# Patient Record
Sex: Female | Born: 1940 | Race: White | Hispanic: No | Marital: Single | Smoking: Current some day smoker
Health system: Southern US, Community
[De-identification: ages and names within clinical notes are randomized; demographics above are authoritative.]

## PROBLEM LIST (undated history)

## (undated) DIAGNOSIS — R0683 Snoring: Secondary | ICD-10-CM

## (undated) DIAGNOSIS — K219 Gastro-esophageal reflux disease without esophagitis: Secondary | ICD-10-CM

## (undated) DIAGNOSIS — M8580 Other specified disorders of bone density and structure, unspecified site: Secondary | ICD-10-CM

## (undated) DIAGNOSIS — H6980 Other specified disorders of Eustachian tube, unspecified ear: Secondary | ICD-10-CM

## (undated) DIAGNOSIS — R945 Abnormal results of liver function studies: Secondary | ICD-10-CM

## (undated) DIAGNOSIS — R3129 Other microscopic hematuria: Secondary | ICD-10-CM

## (undated) DIAGNOSIS — I499 Cardiac arrhythmia, unspecified: Secondary | ICD-10-CM

## (undated) DIAGNOSIS — F329 Major depressive disorder, single episode, unspecified: Secondary | ICD-10-CM

## (undated) DIAGNOSIS — IMO0002 Reserved for concepts with insufficient information to code with codable children: Secondary | ICD-10-CM

## (undated) DIAGNOSIS — C50919 Malignant neoplasm of unspecified site of unspecified female breast: Secondary | ICD-10-CM

## (undated) DIAGNOSIS — R0789 Other chest pain: Secondary | ICD-10-CM

## (undated) DIAGNOSIS — K8020 Calculus of gallbladder without cholecystitis without obstruction: Secondary | ICD-10-CM

## (undated) DIAGNOSIS — M1990 Unspecified osteoarthritis, unspecified site: Secondary | ICD-10-CM

## (undated) DIAGNOSIS — F32A Depression, unspecified: Secondary | ICD-10-CM

## (undated) DIAGNOSIS — Z720 Tobacco use: Secondary | ICD-10-CM

## (undated) DIAGNOSIS — F419 Anxiety disorder, unspecified: Secondary | ICD-10-CM

## (undated) DIAGNOSIS — Z860100 Personal history of colon polyps, unspecified: Secondary | ICD-10-CM

## (undated) DIAGNOSIS — E785 Hyperlipidemia, unspecified: Secondary | ICD-10-CM

## (undated) DIAGNOSIS — R300 Dysuria: Secondary | ICD-10-CM

## (undated) DIAGNOSIS — R0902 Hypoxemia: Secondary | ICD-10-CM

## (undated) DIAGNOSIS — R32 Unspecified urinary incontinence: Secondary | ICD-10-CM

## (undated) DIAGNOSIS — H6990 Unspecified Eustachian tube disorder, unspecified ear: Secondary | ICD-10-CM

## (undated) DIAGNOSIS — I10 Essential (primary) hypertension: Secondary | ICD-10-CM

## (undated) DIAGNOSIS — E039 Hypothyroidism, unspecified: Secondary | ICD-10-CM

## (undated) DIAGNOSIS — J40 Bronchitis, not specified as acute or chronic: Secondary | ICD-10-CM

## (undated) HISTORY — DX: Hypoxemia: R09.02

## (undated) HISTORY — PX: TONSILLECTOMY: SUR1361

## (undated) HISTORY — PX: OTHER SURGICAL HISTORY: SHX169

## (undated) HISTORY — DX: Tobacco use: Z72.0

## (undated) HISTORY — DX: Unspecified urinary incontinence: R32

## (undated) HISTORY — DX: Other specified disorders of Eustachian tube, unspecified ear: H69.80

## (undated) HISTORY — DX: Anxiety disorder, unspecified: F41.9

## (undated) HISTORY — DX: Hyperlipidemia, unspecified: E78.5

## (undated) HISTORY — DX: Abnormal results of liver function studies: R94.5

## (undated) HISTORY — DX: Other chest pain: R07.89

## (undated) HISTORY — DX: Calculus of gallbladder without cholecystitis without obstruction: K80.20

## (undated) HISTORY — PX: VESICOVAGINAL FISTULA CLOSURE W/ TAH: SUR271

## (undated) HISTORY — DX: Other specified disorders of bone density and structure, unspecified site: M85.80

## (undated) HISTORY — DX: Hypothyroidism, unspecified: E03.9

## (undated) HISTORY — DX: Bronchitis, not specified as acute or chronic: J40

## (undated) HISTORY — DX: Unspecified eustachian tube disorder, unspecified ear: H69.90

## (undated) HISTORY — PX: TONSILECTOMY, ADENOIDECTOMY, BILATERAL MYRINGOTOMY AND TUBES: SHX2538

## (undated) HISTORY — DX: Personal history of colon polyps, unspecified: Z86.0100

## (undated) HISTORY — DX: Reserved for concepts with insufficient information to code with codable children: IMO0002

## (undated) HISTORY — DX: Dysuria: R30.0

## (undated) HISTORY — DX: Personal history of colonic polyps: Z86.010

## (undated) HISTORY — DX: Unspecified osteoarthritis, unspecified site: M19.90

## (undated) HISTORY — DX: Depression, unspecified: F32.A

## (undated) HISTORY — DX: Cardiac arrhythmia, unspecified: I49.9

## (undated) HISTORY — DX: Essential (primary) hypertension: I10

## (undated) HISTORY — DX: Gastro-esophageal reflux disease without esophagitis: K21.9

## (undated) HISTORY — DX: Snoring: R06.83

## (undated) HISTORY — DX: Major depressive disorder, single episode, unspecified: F32.9

## (undated) HISTORY — DX: Other microscopic hematuria: R31.29

## (undated) HISTORY — DX: Malignant neoplasm of unspecified site of unspecified female breast: C50.919

---

## 2000-12-18 ENCOUNTER — Encounter: Admission: RE | Admit: 2000-12-18 | Discharge: 2000-12-18 | Payer: Self-pay | Admitting: Oncology

## 2000-12-31 ENCOUNTER — Encounter (INDEPENDENT_AMBULATORY_CARE_PROVIDER_SITE_OTHER): Payer: Self-pay | Admitting: Specialist

## 2000-12-31 ENCOUNTER — Ambulatory Visit (HOSPITAL_BASED_OUTPATIENT_CLINIC_OR_DEPARTMENT_OTHER): Admission: RE | Admit: 2000-12-31 | Discharge: 2000-12-31 | Payer: Self-pay | Admitting: Otolaryngology

## 2001-04-08 ENCOUNTER — Other Ambulatory Visit: Admission: RE | Admit: 2001-04-08 | Discharge: 2001-04-08 | Payer: Self-pay | Admitting: Internal Medicine

## 2001-04-09 ENCOUNTER — Encounter: Payer: Self-pay | Admitting: Internal Medicine

## 2001-04-09 ENCOUNTER — Ambulatory Visit (HOSPITAL_COMMUNITY): Admission: RE | Admit: 2001-04-09 | Discharge: 2001-04-09 | Payer: Self-pay | Admitting: Internal Medicine

## 2001-06-11 ENCOUNTER — Ambulatory Visit (HOSPITAL_COMMUNITY): Admission: RE | Admit: 2001-06-11 | Discharge: 2001-06-11 | Payer: Self-pay | Admitting: Internal Medicine

## 2001-06-11 ENCOUNTER — Encounter: Payer: Self-pay | Admitting: Internal Medicine

## 2001-06-19 ENCOUNTER — Encounter: Admission: RE | Admit: 2001-06-19 | Discharge: 2001-06-19 | Payer: Self-pay | Admitting: Oncology

## 2001-12-17 ENCOUNTER — Encounter (HOSPITAL_COMMUNITY): Admission: RE | Admit: 2001-12-17 | Discharge: 2002-01-16 | Payer: Self-pay | Admitting: Oncology

## 2001-12-17 ENCOUNTER — Encounter: Admission: RE | Admit: 2001-12-17 | Discharge: 2001-12-17 | Payer: Self-pay | Admitting: Oncology

## 2002-04-10 ENCOUNTER — Encounter: Payer: Self-pay | Admitting: Internal Medicine

## 2002-04-10 ENCOUNTER — Ambulatory Visit (HOSPITAL_COMMUNITY): Admission: RE | Admit: 2002-04-10 | Discharge: 2002-04-10 | Payer: Self-pay | Admitting: Internal Medicine

## 2002-04-22 ENCOUNTER — Ambulatory Visit (HOSPITAL_COMMUNITY): Admission: RE | Admit: 2002-04-22 | Discharge: 2002-04-22 | Payer: Self-pay | Admitting: Internal Medicine

## 2002-04-22 HISTORY — PX: COLONOSCOPY: SHX174

## 2002-07-01 ENCOUNTER — Encounter (HOSPITAL_COMMUNITY): Admission: RE | Admit: 2002-07-01 | Discharge: 2002-07-31 | Payer: Self-pay | Admitting: Oncology

## 2002-07-01 ENCOUNTER — Encounter: Admission: RE | Admit: 2002-07-01 | Discharge: 2002-07-01 | Payer: Self-pay | Admitting: Oncology

## 2003-01-02 ENCOUNTER — Encounter: Admission: RE | Admit: 2003-01-02 | Discharge: 2003-01-02 | Payer: Self-pay | Admitting: Oncology

## 2003-01-02 ENCOUNTER — Encounter (HOSPITAL_COMMUNITY): Admission: RE | Admit: 2003-01-02 | Discharge: 2003-01-22 | Payer: Self-pay | Admitting: Oncology

## 2003-04-20 ENCOUNTER — Ambulatory Visit (HOSPITAL_COMMUNITY): Admission: RE | Admit: 2003-04-20 | Discharge: 2003-04-20 | Payer: Self-pay | Admitting: Internal Medicine

## 2003-08-27 ENCOUNTER — Encounter (HOSPITAL_COMMUNITY): Admission: RE | Admit: 2003-08-27 | Discharge: 2003-09-26 | Payer: Self-pay | Admitting: Orthopedic Surgery

## 2003-09-29 ENCOUNTER — Encounter (HOSPITAL_COMMUNITY): Admission: RE | Admit: 2003-09-29 | Discharge: 2003-10-29 | Payer: Self-pay | Admitting: Orthopedic Surgery

## 2004-01-18 ENCOUNTER — Encounter: Admission: RE | Admit: 2004-01-18 | Discharge: 2004-01-22 | Payer: Self-pay | Admitting: Oncology

## 2004-02-01 ENCOUNTER — Encounter: Admission: RE | Admit: 2004-02-01 | Discharge: 2004-02-01 | Payer: Self-pay | Admitting: Oncology

## 2004-02-01 ENCOUNTER — Encounter (HOSPITAL_COMMUNITY): Admission: RE | Admit: 2004-02-01 | Discharge: 2004-03-02 | Payer: Self-pay | Admitting: Oncology

## 2004-04-20 ENCOUNTER — Ambulatory Visit (HOSPITAL_COMMUNITY): Admission: RE | Admit: 2004-04-20 | Discharge: 2004-04-20 | Payer: Self-pay | Admitting: Internal Medicine

## 2005-01-16 ENCOUNTER — Ambulatory Visit (HOSPITAL_COMMUNITY): Payer: Self-pay | Admitting: Oncology

## 2005-01-16 ENCOUNTER — Encounter (HOSPITAL_COMMUNITY): Admission: RE | Admit: 2005-01-16 | Discharge: 2005-01-20 | Payer: Self-pay | Admitting: Oncology

## 2005-01-16 ENCOUNTER — Encounter: Admission: RE | Admit: 2005-01-16 | Discharge: 2005-01-20 | Payer: Self-pay | Admitting: Oncology

## 2005-04-25 ENCOUNTER — Ambulatory Visit (HOSPITAL_COMMUNITY): Admission: RE | Admit: 2005-04-25 | Discharge: 2005-04-25 | Payer: Self-pay | Admitting: Family Medicine

## 2005-05-04 ENCOUNTER — Encounter: Admission: RE | Admit: 2005-05-04 | Discharge: 2005-05-04 | Payer: Self-pay | Admitting: Family Medicine

## 2005-05-04 ENCOUNTER — Encounter (INDEPENDENT_AMBULATORY_CARE_PROVIDER_SITE_OTHER): Payer: Self-pay | Admitting: Specialist

## 2005-05-04 ENCOUNTER — Encounter (INDEPENDENT_AMBULATORY_CARE_PROVIDER_SITE_OTHER): Payer: Self-pay | Admitting: Radiology

## 2005-05-05 ENCOUNTER — Encounter (HOSPITAL_COMMUNITY): Admission: RE | Admit: 2005-05-05 | Discharge: 2005-06-04 | Payer: Self-pay | Admitting: Oncology

## 2005-05-05 ENCOUNTER — Ambulatory Visit (HOSPITAL_COMMUNITY): Payer: Self-pay | Admitting: Oncology

## 2005-05-05 ENCOUNTER — Encounter: Admission: RE | Admit: 2005-05-05 | Discharge: 2005-05-05 | Payer: Self-pay | Admitting: Oncology

## 2005-05-11 ENCOUNTER — Encounter: Admission: RE | Admit: 2005-05-11 | Discharge: 2005-05-11 | Payer: Self-pay | Admitting: General Surgery

## 2005-05-15 ENCOUNTER — Encounter (INDEPENDENT_AMBULATORY_CARE_PROVIDER_SITE_OTHER): Payer: Self-pay | Admitting: *Deleted

## 2005-05-15 ENCOUNTER — Encounter: Admission: RE | Admit: 2005-05-15 | Discharge: 2005-05-15 | Payer: Self-pay | Admitting: General Surgery

## 2005-05-19 ENCOUNTER — Inpatient Hospital Stay (HOSPITAL_COMMUNITY): Admission: RE | Admit: 2005-05-19 | Discharge: 2005-05-21 | Payer: Self-pay | Admitting: General Surgery

## 2005-05-19 ENCOUNTER — Encounter (INDEPENDENT_AMBULATORY_CARE_PROVIDER_SITE_OTHER): Payer: Self-pay | Admitting: General Surgery

## 2005-07-23 HISTORY — PX: COLONOSCOPY: SHX174

## 2005-08-02 ENCOUNTER — Ambulatory Visit: Payer: Self-pay | Admitting: Internal Medicine

## 2005-08-02 ENCOUNTER — Ambulatory Visit (HOSPITAL_COMMUNITY): Admission: RE | Admit: 2005-08-02 | Discharge: 2005-08-02 | Payer: Self-pay | Admitting: Internal Medicine

## 2005-09-01 ENCOUNTER — Encounter (HOSPITAL_COMMUNITY): Admission: RE | Admit: 2005-09-01 | Discharge: 2005-10-01 | Payer: Self-pay | Admitting: Oncology

## 2005-09-01 ENCOUNTER — Ambulatory Visit (HOSPITAL_COMMUNITY): Payer: Self-pay | Admitting: Oncology

## 2005-09-01 ENCOUNTER — Encounter: Admission: RE | Admit: 2005-09-01 | Discharge: 2005-09-01 | Payer: Self-pay | Admitting: Oncology

## 2005-09-11 ENCOUNTER — Ambulatory Visit: Payer: Self-pay | Admitting: Oncology

## 2005-11-01 ENCOUNTER — Ambulatory Visit: Payer: Self-pay | Admitting: Oncology

## 2005-12-23 ENCOUNTER — Encounter (INDEPENDENT_AMBULATORY_CARE_PROVIDER_SITE_OTHER): Payer: Self-pay | Admitting: Family Medicine

## 2005-12-23 LAB — CONVERTED CEMR LAB: Pap Smear: NORMAL

## 2006-01-01 ENCOUNTER — Other Ambulatory Visit: Admission: RE | Admit: 2006-01-01 | Discharge: 2006-01-01 | Payer: Self-pay | Admitting: Gynecology

## 2006-01-12 ENCOUNTER — Encounter (INDEPENDENT_AMBULATORY_CARE_PROVIDER_SITE_OTHER): Payer: Self-pay | Admitting: *Deleted

## 2006-01-12 ENCOUNTER — Ambulatory Visit: Payer: Self-pay | Admitting: Family Medicine

## 2006-01-15 ENCOUNTER — Encounter (INDEPENDENT_AMBULATORY_CARE_PROVIDER_SITE_OTHER): Payer: Self-pay | Admitting: Family Medicine

## 2006-01-15 LAB — CONVERTED CEMR LAB
Cholesterol: 137 mg/dL
LDL Cholesterol: 74 mg/dL
TSH: 1.12 microintl units/mL
TSH: 1.12 microintl units/mL

## 2006-01-29 ENCOUNTER — Encounter: Admission: RE | Admit: 2006-01-29 | Discharge: 2006-01-29 | Payer: Self-pay | Admitting: Oncology

## 2006-01-29 ENCOUNTER — Ambulatory Visit (HOSPITAL_COMMUNITY): Payer: Self-pay | Admitting: Oncology

## 2006-01-29 ENCOUNTER — Encounter (HOSPITAL_COMMUNITY): Admission: RE | Admit: 2006-01-29 | Discharge: 2006-02-28 | Payer: Self-pay | Admitting: Oncology

## 2006-02-12 ENCOUNTER — Ambulatory Visit: Payer: Self-pay | Admitting: Family Medicine

## 2006-02-26 ENCOUNTER — Ambulatory Visit: Payer: Self-pay | Admitting: Family Medicine

## 2006-03-21 ENCOUNTER — Encounter: Payer: Self-pay | Admitting: Family Medicine

## 2006-03-21 DIAGNOSIS — J309 Allergic rhinitis, unspecified: Secondary | ICD-10-CM | POA: Insufficient documentation

## 2006-03-21 DIAGNOSIS — K219 Gastro-esophageal reflux disease without esophagitis: Secondary | ICD-10-CM | POA: Insufficient documentation

## 2006-03-21 DIAGNOSIS — H269 Unspecified cataract: Secondary | ICD-10-CM | POA: Insufficient documentation

## 2006-03-21 DIAGNOSIS — M899 Disorder of bone, unspecified: Secondary | ICD-10-CM | POA: Insufficient documentation

## 2006-03-21 DIAGNOSIS — E039 Hypothyroidism, unspecified: Secondary | ICD-10-CM | POA: Insufficient documentation

## 2006-03-21 DIAGNOSIS — M129 Arthropathy, unspecified: Secondary | ICD-10-CM | POA: Insufficient documentation

## 2006-03-21 DIAGNOSIS — Z860100 Personal history of colon polyps, unspecified: Secondary | ICD-10-CM | POA: Insufficient documentation

## 2006-03-21 DIAGNOSIS — E785 Hyperlipidemia, unspecified: Secondary | ICD-10-CM | POA: Insufficient documentation

## 2006-03-21 DIAGNOSIS — M949 Disorder of cartilage, unspecified: Secondary | ICD-10-CM

## 2006-03-21 DIAGNOSIS — Z853 Personal history of malignant neoplasm of breast: Secondary | ICD-10-CM | POA: Insufficient documentation

## 2006-03-21 DIAGNOSIS — I10 Essential (primary) hypertension: Secondary | ICD-10-CM | POA: Insufficient documentation

## 2006-03-26 ENCOUNTER — Ambulatory Visit: Payer: Self-pay | Admitting: Family Medicine

## 2006-05-03 ENCOUNTER — Ambulatory Visit: Payer: Self-pay | Admitting: Family Medicine

## 2006-05-08 ENCOUNTER — Encounter (INDEPENDENT_AMBULATORY_CARE_PROVIDER_SITE_OTHER): Payer: Self-pay | Admitting: Family Medicine

## 2006-05-24 ENCOUNTER — Ambulatory Visit: Payer: Self-pay | Admitting: Family Medicine

## 2006-07-18 ENCOUNTER — Encounter (INDEPENDENT_AMBULATORY_CARE_PROVIDER_SITE_OTHER): Payer: Self-pay | Admitting: Family Medicine

## 2006-07-23 LAB — CONVERTED CEMR LAB
Alkaline Phosphatase: 107 units/L (ref 39–117)
CO2: 23 meq/L (ref 19–32)
Cholesterol: 182 mg/dL (ref 0–200)
Creatinine, Ser: 0.56 mg/dL (ref 0.40–1.20)
Glucose, Bld: 105 mg/dL — ABNORMAL HIGH (ref 70–99)
LDL Cholesterol: 106 mg/dL — ABNORMAL HIGH (ref 0–99)
Potassium: 3.8 meq/L (ref 3.5–5.3)
Sodium: 140 meq/L (ref 135–145)
Total Protein: 6.8 g/dL (ref 6.0–8.3)

## 2006-07-30 ENCOUNTER — Encounter (INDEPENDENT_AMBULATORY_CARE_PROVIDER_SITE_OTHER): Payer: Self-pay | Admitting: Family Medicine

## 2006-07-30 ENCOUNTER — Ambulatory Visit (HOSPITAL_COMMUNITY): Payer: Self-pay | Admitting: Oncology

## 2006-08-02 ENCOUNTER — Ambulatory Visit: Payer: Self-pay | Admitting: Family Medicine

## 2006-08-08 ENCOUNTER — Encounter (INDEPENDENT_AMBULATORY_CARE_PROVIDER_SITE_OTHER): Payer: Self-pay | Admitting: Family Medicine

## 2006-08-23 ENCOUNTER — Ambulatory Visit: Payer: Self-pay | Admitting: Family Medicine

## 2006-08-23 LAB — CONVERTED CEMR LAB: HDL goal, serum: 40 mg/dL

## 2006-09-07 ENCOUNTER — Telehealth (INDEPENDENT_AMBULATORY_CARE_PROVIDER_SITE_OTHER): Payer: Self-pay | Admitting: Family Medicine

## 2006-09-11 ENCOUNTER — Encounter (INDEPENDENT_AMBULATORY_CARE_PROVIDER_SITE_OTHER): Payer: Self-pay | Admitting: Family Medicine

## 2006-10-25 ENCOUNTER — Ambulatory Visit: Payer: Self-pay | Admitting: Family Medicine

## 2006-12-27 ENCOUNTER — Ambulatory Visit: Payer: Self-pay | Admitting: Family Medicine

## 2006-12-31 ENCOUNTER — Encounter (INDEPENDENT_AMBULATORY_CARE_PROVIDER_SITE_OTHER): Payer: Self-pay | Admitting: Family Medicine

## 2007-01-01 ENCOUNTER — Telehealth (INDEPENDENT_AMBULATORY_CARE_PROVIDER_SITE_OTHER): Payer: Self-pay | Admitting: *Deleted

## 2007-01-01 LAB — CONVERTED CEMR LAB
BUN: 12 mg/dL (ref 6–23)
Basophils Absolute: 0.1 10*3/uL (ref 0.0–0.1)
Chloride: 104 meq/L (ref 96–112)
Creatinine, Ser: 0.63 mg/dL (ref 0.40–1.20)
Eosinophils Absolute: 0.3 10*3/uL (ref 0.0–0.7)
Eosinophils Relative: 4 % (ref 0–5)
HDL: 47 mg/dL (ref >39)
LDL Cholesterol: 102 mg/dL — ABNORMAL HIGH (ref 0–99)
Lymphocytes Relative: 41 % (ref 12–46)
Lymphs Abs: 2.9 10*3/uL (ref 0.7–3.3)
MCHC: 31.6 g/dL (ref 30.0–36.0)
MCV: 96.9 fL (ref 78.0–100.0)
Neutro Abs: 3.2 10*3/uL (ref 1.7–7.7)
Potassium: 4 meq/L (ref 3.5–5.3)
RBC: 4.77 M/uL (ref 3.87–5.11)
Sodium: 141 meq/L (ref 135–145)
TSH: 0.327 microintl units/mL — ABNORMAL LOW (ref 0.350–5.50)
Total CHOL/HDL Ratio: 3.8
Triglycerides: 149 mg/dL (ref <150)
VLDL: 30 mg/dL (ref 0–40)
WBC: 7.1 10*3/uL (ref 4.0–10.5)

## 2007-01-04 ENCOUNTER — Ambulatory Visit: Payer: Self-pay | Admitting: Family Medicine

## 2007-01-07 ENCOUNTER — Encounter (INDEPENDENT_AMBULATORY_CARE_PROVIDER_SITE_OTHER): Payer: Self-pay | Admitting: Family Medicine

## 2007-01-08 ENCOUNTER — Encounter (INDEPENDENT_AMBULATORY_CARE_PROVIDER_SITE_OTHER): Payer: Self-pay | Admitting: Family Medicine

## 2007-01-09 ENCOUNTER — Telehealth (INDEPENDENT_AMBULATORY_CARE_PROVIDER_SITE_OTHER): Payer: Self-pay | Admitting: *Deleted

## 2007-01-14 ENCOUNTER — Other Ambulatory Visit: Admission: RE | Admit: 2007-01-14 | Discharge: 2007-01-14 | Payer: Self-pay | Admitting: Gynecology

## 2007-01-14 LAB — CONVERTED CEMR LAB

## 2007-01-15 ENCOUNTER — Encounter (INDEPENDENT_AMBULATORY_CARE_PROVIDER_SITE_OTHER): Payer: Self-pay | Admitting: Family Medicine

## 2007-01-18 ENCOUNTER — Ambulatory Visit: Payer: Self-pay | Admitting: Family Medicine

## 2007-02-05 ENCOUNTER — Ambulatory Visit (HOSPITAL_COMMUNITY): Payer: Self-pay | Admitting: Oncology

## 2007-02-05 ENCOUNTER — Encounter (INDEPENDENT_AMBULATORY_CARE_PROVIDER_SITE_OTHER): Payer: Self-pay | Admitting: Family Medicine

## 2007-02-15 ENCOUNTER — Ambulatory Visit: Payer: Self-pay | Admitting: Family Medicine

## 2007-03-05 ENCOUNTER — Ambulatory Visit: Payer: Self-pay | Admitting: Family Medicine

## 2007-03-05 LAB — CONVERTED CEMR LAB
Inflenza A Ag: NEGATIVE
Rapid Strep: NEGATIVE

## 2007-03-06 ENCOUNTER — Telehealth (INDEPENDENT_AMBULATORY_CARE_PROVIDER_SITE_OTHER): Payer: Self-pay | Admitting: *Deleted

## 2007-04-16 ENCOUNTER — Ambulatory Visit: Payer: Self-pay | Admitting: Family Medicine

## 2007-04-16 ENCOUNTER — Telehealth (INDEPENDENT_AMBULATORY_CARE_PROVIDER_SITE_OTHER): Payer: Self-pay | Admitting: *Deleted

## 2007-04-16 DIAGNOSIS — R3129 Other microscopic hematuria: Secondary | ICD-10-CM | POA: Insufficient documentation

## 2007-04-16 DIAGNOSIS — R32 Unspecified urinary incontinence: Secondary | ICD-10-CM | POA: Insufficient documentation

## 2007-04-16 LAB — CONVERTED CEMR LAB
Glucose, Urine, Semiquant: NEGATIVE
Nitrite: NEGATIVE
Specific Gravity, Urine: 1.02
pH: 7.5

## 2007-04-17 ENCOUNTER — Telehealth (INDEPENDENT_AMBULATORY_CARE_PROVIDER_SITE_OTHER): Payer: Self-pay | Admitting: *Deleted

## 2007-04-17 ENCOUNTER — Encounter (INDEPENDENT_AMBULATORY_CARE_PROVIDER_SITE_OTHER): Payer: Self-pay | Admitting: Family Medicine

## 2007-04-17 LAB — CONVERTED CEMR LAB

## 2007-04-30 ENCOUNTER — Telehealth (INDEPENDENT_AMBULATORY_CARE_PROVIDER_SITE_OTHER): Payer: Self-pay | Admitting: *Deleted

## 2007-04-30 ENCOUNTER — Encounter (INDEPENDENT_AMBULATORY_CARE_PROVIDER_SITE_OTHER): Payer: Self-pay | Admitting: Family Medicine

## 2007-04-30 DIAGNOSIS — R0902 Hypoxemia: Secondary | ICD-10-CM | POA: Insufficient documentation

## 2007-05-29 ENCOUNTER — Ambulatory Visit: Payer: Self-pay | Admitting: Family Medicine

## 2007-05-30 ENCOUNTER — Encounter (INDEPENDENT_AMBULATORY_CARE_PROVIDER_SITE_OTHER): Payer: Self-pay | Admitting: Family Medicine

## 2007-07-07 ENCOUNTER — Encounter (INDEPENDENT_AMBULATORY_CARE_PROVIDER_SITE_OTHER): Payer: Self-pay | Admitting: Family Medicine

## 2007-07-07 ENCOUNTER — Ambulatory Visit: Admission: RE | Admit: 2007-07-07 | Discharge: 2007-07-07 | Payer: Self-pay | Admitting: Family Medicine

## 2007-07-30 ENCOUNTER — Ambulatory Visit: Payer: Self-pay | Admitting: Pulmonary Disease

## 2007-08-06 ENCOUNTER — Ambulatory Visit (HOSPITAL_COMMUNITY): Payer: Self-pay | Admitting: Oncology

## 2007-08-06 ENCOUNTER — Encounter (INDEPENDENT_AMBULATORY_CARE_PROVIDER_SITE_OTHER): Payer: Self-pay | Admitting: Family Medicine

## 2007-08-13 ENCOUNTER — Telehealth (INDEPENDENT_AMBULATORY_CARE_PROVIDER_SITE_OTHER): Payer: Self-pay | Admitting: *Deleted

## 2007-08-14 ENCOUNTER — Ambulatory Visit: Payer: Self-pay | Admitting: Internal Medicine

## 2007-08-26 ENCOUNTER — Ambulatory Visit: Payer: Self-pay | Admitting: Family Medicine

## 2007-08-27 ENCOUNTER — Encounter (INDEPENDENT_AMBULATORY_CARE_PROVIDER_SITE_OTHER): Payer: Self-pay | Admitting: Family Medicine

## 2007-08-27 ENCOUNTER — Telehealth (INDEPENDENT_AMBULATORY_CARE_PROVIDER_SITE_OTHER): Payer: Self-pay | Admitting: *Deleted

## 2007-09-11 ENCOUNTER — Ambulatory Visit: Payer: Self-pay | Admitting: Family Medicine

## 2007-09-12 ENCOUNTER — Ambulatory Visit (HOSPITAL_COMMUNITY): Admission: RE | Admit: 2007-09-12 | Discharge: 2007-09-12 | Payer: Self-pay | Admitting: Family Medicine

## 2007-09-12 ENCOUNTER — Encounter (INDEPENDENT_AMBULATORY_CARE_PROVIDER_SITE_OTHER): Payer: Self-pay | Admitting: Family Medicine

## 2007-09-12 LAB — CONVERTED CEMR LAB
ALT: 124 units/L — ABNORMAL HIGH (ref 0–35)
ALT: 31 units/L (ref 0–35)
Albumin: 4.2 g/dL (ref 3.5–5.2)
Albumin: 4.3 g/dL (ref 3.5–5.2)
Alkaline Phosphatase: 109 units/L (ref 39–117)
CO2: 23 meq/L (ref 19–32)
Chloride: 102 meq/L (ref 96–112)
Sodium: 137 meq/L (ref 135–145)
Total Protein: 7.3 g/dL (ref 6.0–8.3)

## 2007-09-23 ENCOUNTER — Ambulatory Visit: Payer: Self-pay | Admitting: Family Medicine

## 2007-09-23 DIAGNOSIS — K8020 Calculus of gallbladder without cholecystitis without obstruction: Secondary | ICD-10-CM | POA: Insufficient documentation

## 2007-09-23 LAB — CONVERTED CEMR LAB
Blood in Urine, dipstick: NEGATIVE
Glucose, Urine, Semiquant: NEGATIVE
Ketones, urine, test strip: NEGATIVE
Nitrite: NEGATIVE
Protein, U semiquant: NEGATIVE

## 2007-11-05 ENCOUNTER — Ambulatory Visit: Payer: Self-pay | Admitting: Family Medicine

## 2007-11-06 LAB — CONVERTED CEMR LAB
ALT: 29 units/L (ref 0–35)
AST: 18 units/L (ref 0–37)
Albumin: 4.4 g/dL (ref 3.5–5.2)
Total Bilirubin: 0.5 mg/dL (ref 0.3–1.2)

## 2007-11-18 ENCOUNTER — Ambulatory Visit: Payer: Self-pay | Admitting: Family Medicine

## 2007-11-18 LAB — CONVERTED CEMR LAB
Blood in Urine, dipstick: NEGATIVE
Glucose, Urine, Semiquant: NEGATIVE
Ketones, urine, test strip: NEGATIVE

## 2007-11-22 ENCOUNTER — Telehealth (INDEPENDENT_AMBULATORY_CARE_PROVIDER_SITE_OTHER): Payer: Self-pay | Admitting: Family Medicine

## 2008-02-04 ENCOUNTER — Encounter (INDEPENDENT_AMBULATORY_CARE_PROVIDER_SITE_OTHER): Payer: Self-pay | Admitting: Family Medicine

## 2008-02-04 ENCOUNTER — Ambulatory Visit (HOSPITAL_COMMUNITY): Payer: Self-pay | Admitting: Oncology

## 2008-02-05 ENCOUNTER — Ambulatory Visit: Payer: Self-pay | Admitting: Family Medicine

## 2008-02-06 ENCOUNTER — Encounter (INDEPENDENT_AMBULATORY_CARE_PROVIDER_SITE_OTHER): Payer: Self-pay | Admitting: Family Medicine

## 2008-02-10 LAB — CONVERTED CEMR LAB
AST: 22 units/L (ref 0–37)
Calcium: 9.5 mg/dL (ref 8.4–10.5)
Cholesterol: 202 mg/dL — ABNORMAL HIGH (ref 0–200)
Eosinophils Absolute: 0.3 10*3/uL (ref 0.0–0.7)
Eosinophils Relative: 3 % (ref 0–5)
HCT: 44.6 % (ref 36.0–46.0)
Lymphocytes Relative: 34 % (ref 12–46)
Lymphs Abs: 3.2 10*3/uL (ref 0.7–4.0)
MCHC: 33.9 g/dL (ref 30.0–36.0)
MCV: 92.3 fL (ref 78.0–100.0)
Monocytes Absolute: 0.8 10*3/uL (ref 0.1–1.0)
Monocytes Relative: 8 % (ref 3–12)
Neutrophils Relative %: 54 % (ref 43–77)
Potassium: 4.1 meq/L (ref 3.5–5.3)
Sodium: 140 meq/L (ref 135–145)
Total Bilirubin: 0.5 mg/dL (ref 0.3–1.2)
Total CHOL/HDL Ratio: 4.3
Triglycerides: 184 mg/dL — ABNORMAL HIGH (ref <150)
VLDL: 37 mg/dL (ref 0–40)

## 2008-02-11 ENCOUNTER — Encounter (INDEPENDENT_AMBULATORY_CARE_PROVIDER_SITE_OTHER): Payer: Self-pay | Admitting: Family Medicine

## 2008-02-18 ENCOUNTER — Ambulatory Visit: Payer: Self-pay | Admitting: Family Medicine

## 2008-02-18 DIAGNOSIS — M1990 Unspecified osteoarthritis, unspecified site: Secondary | ICD-10-CM | POA: Insufficient documentation

## 2008-02-20 ENCOUNTER — Encounter (INDEPENDENT_AMBULATORY_CARE_PROVIDER_SITE_OTHER): Payer: Self-pay | Admitting: Family Medicine

## 2008-04-30 ENCOUNTER — Encounter (INDEPENDENT_AMBULATORY_CARE_PROVIDER_SITE_OTHER): Payer: Self-pay | Admitting: Family Medicine

## 2008-05-07 ENCOUNTER — Ambulatory Visit: Payer: Self-pay | Admitting: Family Medicine

## 2008-05-07 DIAGNOSIS — H6690 Otitis media, unspecified, unspecified ear: Secondary | ICD-10-CM | POA: Insufficient documentation

## 2008-05-08 ENCOUNTER — Encounter (INDEPENDENT_AMBULATORY_CARE_PROVIDER_SITE_OTHER): Payer: Self-pay | Admitting: Family Medicine

## 2008-05-11 LAB — CONVERTED CEMR LAB
AST: 19 units/L (ref 0–37)
Albumin: 4.4 g/dL (ref 3.5–5.2)
Alkaline Phosphatase: 95 units/L (ref 39–117)
BUN: 17 mg/dL (ref 6–23)
Calcium: 9.5 mg/dL (ref 8.4–10.5)
Cholesterol: 197 mg/dL (ref 0–200)
Creatinine, Ser: 0.65 mg/dL (ref 0.40–1.20)
HDL: 48 mg/dL (ref >39)
Potassium: 3.9 meq/L (ref 3.5–5.3)
Sodium: 140 meq/L (ref 135–145)
Total Bilirubin: 0.5 mg/dL (ref 0.3–1.2)
Triglycerides: 195 mg/dL — ABNORMAL HIGH (ref <150)

## 2008-05-27 ENCOUNTER — Encounter (INDEPENDENT_AMBULATORY_CARE_PROVIDER_SITE_OTHER): Payer: Self-pay | Admitting: Family Medicine

## 2008-06-05 ENCOUNTER — Telehealth (INDEPENDENT_AMBULATORY_CARE_PROVIDER_SITE_OTHER): Payer: Self-pay | Admitting: *Deleted

## 2008-06-23 ENCOUNTER — Telehealth (INDEPENDENT_AMBULATORY_CARE_PROVIDER_SITE_OTHER): Payer: Self-pay | Admitting: *Deleted

## 2008-08-04 ENCOUNTER — Encounter (INDEPENDENT_AMBULATORY_CARE_PROVIDER_SITE_OTHER): Payer: Self-pay | Admitting: Family Medicine

## 2008-08-04 ENCOUNTER — Ambulatory Visit (HOSPITAL_COMMUNITY): Payer: Self-pay | Admitting: Oncology

## 2008-08-07 ENCOUNTER — Ambulatory Visit: Payer: Self-pay | Admitting: Family Medicine

## 2008-08-07 DIAGNOSIS — F341 Dysthymic disorder: Secondary | ICD-10-CM | POA: Insufficient documentation

## 2008-09-04 ENCOUNTER — Ambulatory Visit: Payer: Self-pay | Admitting: Family Medicine

## 2008-09-04 DIAGNOSIS — R059 Cough, unspecified: Secondary | ICD-10-CM | POA: Insufficient documentation

## 2008-09-04 DIAGNOSIS — R05 Cough: Secondary | ICD-10-CM

## 2008-09-07 ENCOUNTER — Ambulatory Visit (HOSPITAL_COMMUNITY): Admission: RE | Admit: 2008-09-07 | Discharge: 2008-09-07 | Payer: Self-pay | Admitting: Family Medicine

## 2008-09-21 ENCOUNTER — Encounter (INDEPENDENT_AMBULATORY_CARE_PROVIDER_SITE_OTHER): Payer: Self-pay | Admitting: Family Medicine

## 2008-09-21 ENCOUNTER — Ambulatory Visit (HOSPITAL_COMMUNITY): Admission: RE | Admit: 2008-09-21 | Discharge: 2008-09-21 | Payer: Self-pay | Admitting: Family Medicine

## 2008-11-04 ENCOUNTER — Ambulatory Visit: Payer: Self-pay | Admitting: Family Medicine

## 2008-11-04 DIAGNOSIS — F17200 Nicotine dependence, unspecified, uncomplicated: Secondary | ICD-10-CM | POA: Insufficient documentation

## 2009-01-01 ENCOUNTER — Encounter (INDEPENDENT_AMBULATORY_CARE_PROVIDER_SITE_OTHER): Payer: Self-pay | Admitting: Family Medicine

## 2009-01-04 LAB — CONVERTED CEMR LAB
BUN: 13 mg/dL (ref 6–23)
CO2: 25 meq/L (ref 19–32)
Chloride: 102 meq/L (ref 96–112)
Creatinine, Ser: 0.68 mg/dL (ref 0.40–1.20)
Eosinophils Relative: 5 % (ref 0–5)
HDL: 68 mg/dL (ref >39)
Lymphocytes Relative: 41 % (ref 12–46)
Lymphs Abs: 3.4 10*3/uL (ref 0.7–4.0)
MCHC: 32.5 g/dL (ref 30.0–36.0)
Monocytes Relative: 8 % (ref 3–12)
Neutrophils Relative %: 45 % (ref 43–77)
Potassium: 4.7 meq/L (ref 3.5–5.3)
Sodium: 140 meq/L (ref 135–145)
Specific Gravity, Urine: 1.015 (ref 1.005–1.030)
TSH: 13.042 microintl units/mL — ABNORMAL HIGH (ref 0.350–4.500)
Total Protein: 7.2 g/dL (ref 6.0–8.3)
Triglycerides: 185 mg/dL — ABNORMAL HIGH (ref <150)
pH: 7.5 (ref 5.0–8.0)

## 2009-01-08 ENCOUNTER — Ambulatory Visit: Payer: Self-pay | Admitting: Family Medicine

## 2009-02-02 ENCOUNTER — Ambulatory Visit (HOSPITAL_COMMUNITY): Payer: Self-pay | Admitting: Oncology

## 2009-06-25 ENCOUNTER — Ambulatory Visit (HOSPITAL_COMMUNITY)
Admission: RE | Admit: 2009-06-25 | Discharge: 2009-06-25 | Payer: Self-pay | Source: Home / Self Care | Admitting: Internal Medicine

## 2009-08-03 ENCOUNTER — Ambulatory Visit (HOSPITAL_COMMUNITY): Payer: Self-pay | Admitting: Oncology

## 2010-02-23 ENCOUNTER — Encounter (HOSPITAL_COMMUNITY): Admission: RE | Admit: 2010-02-23 | Discharge: 2010-03-25 | Payer: Self-pay | Admitting: Oncology

## 2010-02-23 ENCOUNTER — Ambulatory Visit (HOSPITAL_COMMUNITY): Payer: Self-pay | Admitting: Oncology

## 2010-05-11 ENCOUNTER — Encounter: Payer: Self-pay | Admitting: Internal Medicine

## 2010-05-11 ENCOUNTER — Ambulatory Visit
Admission: RE | Admit: 2010-05-11 | Discharge: 2010-05-11 | Payer: Self-pay | Source: Home / Self Care | Attending: Gastroenterology | Admitting: Gastroenterology

## 2010-05-11 DIAGNOSIS — R1319 Other dysphagia: Secondary | ICD-10-CM | POA: Insufficient documentation

## 2010-05-11 DIAGNOSIS — K5900 Constipation, unspecified: Secondary | ICD-10-CM | POA: Insufficient documentation

## 2010-05-11 DIAGNOSIS — R109 Unspecified abdominal pain: Secondary | ICD-10-CM | POA: Insufficient documentation

## 2010-05-12 ENCOUNTER — Encounter: Payer: Self-pay | Admitting: Gastroenterology

## 2010-05-15 ENCOUNTER — Encounter: Payer: Self-pay | Admitting: Family Medicine

## 2010-05-18 LAB — CONVERTED CEMR LAB
ALT: 31 units/L (ref 0–35)
Albumin: 4.4 g/dL (ref 3.5–5.2)
Bilirubin, Direct: 0.1 mg/dL (ref 0.0–0.3)
Indirect Bilirubin: 0.3 mg/dL (ref 0.0–0.9)
Total Bilirubin: 0.4 mg/dL (ref 0.3–1.2)
Total Protein: 6.9 g/dL (ref 6.0–8.3)

## 2010-05-25 ENCOUNTER — Encounter: Payer: Medicare Other | Admitting: Internal Medicine

## 2010-05-25 ENCOUNTER — Ambulatory Visit (HOSPITAL_COMMUNITY)
Admission: RE | Admit: 2010-05-25 | Discharge: 2010-05-25 | Disposition: A | Discharge status: Home / Self Care | Payer: Medicare Other | Attending: Internal Medicine | Admitting: Internal Medicine

## 2010-05-25 ENCOUNTER — Other Ambulatory Visit: Payer: Self-pay | Admitting: Internal Medicine

## 2010-05-25 ENCOUNTER — Inpatient Hospital Stay (HOSPITAL_COMMUNITY): Admit: 2010-05-25 | Payer: Self-pay

## 2010-05-25 ENCOUNTER — Encounter: Payer: Self-pay | Admitting: Internal Medicine

## 2010-05-25 DIAGNOSIS — K259 Gastric ulcer, unspecified as acute or chronic, without hemorrhage or perforation: Secondary | ICD-10-CM | POA: Insufficient documentation

## 2010-05-25 DIAGNOSIS — K2100 Gastro-esophageal reflux disease with esophagitis, without bleeding: Secondary | ICD-10-CM

## 2010-05-25 DIAGNOSIS — Z79899 Other long term (current) drug therapy: Secondary | ICD-10-CM | POA: Insufficient documentation

## 2010-05-25 DIAGNOSIS — K5900 Constipation, unspecified: Secondary | ICD-10-CM

## 2010-05-25 DIAGNOSIS — E785 Hyperlipidemia, unspecified: Secondary | ICD-10-CM | POA: Insufficient documentation

## 2010-05-25 DIAGNOSIS — R1310 Dysphagia, unspecified: Secondary | ICD-10-CM | POA: Insufficient documentation

## 2010-05-25 DIAGNOSIS — K648 Other hemorrhoids: Secondary | ICD-10-CM

## 2010-05-25 DIAGNOSIS — I10 Essential (primary) hypertension: Secondary | ICD-10-CM | POA: Insufficient documentation

## 2010-05-25 DIAGNOSIS — Z8371 Family history of colonic polyps: Secondary | ICD-10-CM | POA: Insufficient documentation

## 2010-05-25 DIAGNOSIS — K449 Diaphragmatic hernia without obstruction or gangrene: Secondary | ICD-10-CM | POA: Insufficient documentation

## 2010-05-25 DIAGNOSIS — Z83719 Family history of colon polyps, unspecified: Secondary | ICD-10-CM | POA: Insufficient documentation

## 2010-05-25 DIAGNOSIS — R109 Unspecified abdominal pain: Secondary | ICD-10-CM | POA: Insufficient documentation

## 2010-05-25 HISTORY — PX: COLONOSCOPY: SHX174

## 2010-05-25 HISTORY — PX: ESOPHAGOGASTRODUODENOSCOPY: SHX1529

## 2010-05-26 NOTE — Letter (Signed)
Summary: REFERRAL FROM DR Beather Arbour  REFERRAL FROM DR [facility]   Imported By: Rexene Alberts 05/12/2010 13:59:11  _____________________________________________________________________  External Attachment:    Type:   Image     Comment:   External Document

## 2010-05-26 NOTE — Assessment & Plan Note (Addendum)
Summary: ?GALLSTONES,?PANCREATITIS,HURTING IN  SIDES TO STOMACH/SS   Visit Type:  Initial Consult Referring Provider:  Dr. Beather Arbour Primary Care Provider:  Dr. Dwana Melena  CC:  pain in back and radiates to both sides.  History of Present Illness: [name] is a pleasant 70 year old Caucasian female who presents at the request of Dr. Beather Arbour secondary to abdominal pain. She does have a hx of known gallstones. She reports lower abdominal pain, as well as lower back pain. This is exacerbated by movement. At times she doubles over in pain. Unable to characterize, states "like a gas pain" and "pressure". +constipation, BM every few days with as much as 5 days in between. Reports one episode of a large BM with immediate resolution of lower abdominal/back pain. Onset since thanksgiving, but this has worsened over the past few weeks. No brbpr. Hx of reflux, feels like food gets "hung". +dysphagia, but she is unsure how long this has been occurring. Denies loss of appetite.  Due for screening colonoscopy this April. Last TCS in April 2007: minimal internal hemorrhoids, otherwise normal.   Labs 04/27/10: H/H 15.6/45.1, Amylase nl 46, Lipase nl 29, no LFTs.   Current Medications (verified): 1)  Levothroid 200 Mcg Tabs (Levothyroxine Sodium) .... One Daily 2)  Lipitor 40 Mg Tabs (Atorvastatin Calcium) .... One Daily 3)  Aspirin 81 Mg Tbec (Aspirin) .... Once Daily 4)  Caltrate 600+d Plus 600-400 Mg-Unit  Tabs (Calcium Carbonate-Vit D-Min) .... Two Times A Day 5)  Amlodipine Besylate 10 Mg Tabs (Amlodipine Besylate) .... Once Daily 6)  Lisinopril-Hydrochlorothiazide 20-12.5 Mg Tabs (Lisinopril-Hydrochlorothiazide) .... One Daily 7)  Arimidex 1 Mg Tabs (Anastrozole) .... One Daily Per Dr. Dallas Schimke 8)  Fish Oil 1200 Mg Caps (Omega-3 Fatty Acids) .... Two Daily 9)  Vitamin D 1000 Unit Tabs (Cholecalciferol) .... One Daily 10)  Citalopram Hydrobromide 20 Mg Tabs (Citalopram Hydrobromide) .... One  Daily 11)  Arimidex 1 Mg Tabs (Anastrozole) .... One Tablet Daily 12)  Chelated Potassium 99 Mg Tabs (Potassium) .... One Tablet Daily 13)  Iron 27mg  Otc .... One Tablet Daily 14)  Omeprazole 20 Mg Cpdr (Omeprazole) .... As Needed 15)  Zofran 4 Mg Tabs (Ondansetron Hcl) .... As Needed For Nausea  Allergies (verified): 1)  ! Sulfa  Past History:  Past Medical History: Current Problems:  DYSURIA (ICD-788.1) GALLSTONES (ICD-574.20) LIVER FUNCTION TESTS, ABNORMAL (ICD-794.8) CHEST WALL PAIN, ANTERIOR (ICD-786.52) EUSTACHIAN TUBE DYSFUNCTION (ICD-381.81) SNORING (ICD-786.09) HYPOXEMIA (ICD-799.02) MICROSCOPIC HEMATURIA (ICD-599.72) INCONTINENCE (ICD-788.30) MALAISE AND FATIGUE (ICD-780.79) COLONIC POLYPS (ICD-211.3) TOBACCO ABUSE (ICD-305.1) HX, PERSONAL, MALIGNANCY, BREAST (ICD-V10.3) CATARACT NOS (ICD-366.9) ARTHRITIS (ICD-716.90) BRONCHITIS (ICD-490) ABNORMAL HEART RHYTHMS (ICD-427.9) OSTEOPENIA (ICD-733.90) HYPOTHYROIDISM (ICD-244.9) HYPERTENSION (ICD-401.9) HYPERLIPIDEMIA (ICD-272.4) GERD (ICD-530.81) DEPRESSION (ICD-311) ANXIETY (ICD-300.00) ALLERGIC RHINITIS (ICD-477.9)  TCS April 2007: minimal internal hemorrhoids, otherwise normal rectum  Past Surgical History: 1. Hysterectomy - excessive bleeding - benign. 2. Bilateral Mastectomy  3. Tonsillectomy 4. Multiple left leg surgeries due to leg fracture after falling off ladder. 5. Myringotomy tubes bilaterally - 2009 6. Left RTC tear  Family History: Father: deceased age 39 CVA  Mother: deceased age 57 cancer Not sure of age with hx of CHF.  1 child: Boy age 59 - healthy except for HTN and Hyperlipidemia sister: cancerous polyps age 67s  Social History: Married x 2 and Divorced 1  Quit smoking 3/09--previously 1 pack/ 2 days--x 40+ years.  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 Lives alone   Review of Systems General:  Denies fever, chills, and anorexia. Eyes:  Denies blurring, irritation, and discharge. ENT:  Complains of difficulty swallowing; denies sore throat and hoarseness. CV:  Denies chest pains and syncope. Resp:  Denies dyspnea at rest and wheezing. GI:  Complains of difficulty swallowing, indigestion/heartburn, abdominal pain, gas/bloating, and constipation; denies pain on swallowing, nausea, bloody BM's, black BMs, and fecal incontinence. GU:  Denies urinary burning and urinary frequency. MS:  Denies joint pain / LOM, joint swelling, and joint stiffness. Derm:  Denies rash, itching, and dry skin. Neuro:  Denies weakness and syncope. Psych:  Denies depression and anxiety. Endo:  Denies cold intolerance and heat intolerance.  Vital Signs:  Patient profile:   70 year old female Height:      63 inches Weight:      195 pounds BMI:     34.67 Temp:     97.9 degrees F oral Pulse rate:   72 / minute BP sitting:   128 / 66  (left arm) Cuff size:   large  Vitals Entered By: Cloria Spring LPN (May 11, 2010 2:07 PM)  Physical Exam  General:  Well developed, well nourished, no acute distress. Head:  Normocephalic and atraumatic. Eyes:  sclera non-icteric, conjuctiva clear Mouth:  No deformity or lesions, dentition normal. Lungs:  Clear throughout to auscultation. Heart:  Regular rate and rhythm; no murmurs, rubs,  or bruits. Abdomen:  +BS, soft, obese, non-tender, non-distended, no HSM noted. no rebound or guarding.  Msk:  Symmetrical with no gross deformities. Normal posture. Pulses:  Normal pulses noted. Extremities:  No clubbing, cyanosis, edema or deformities noted. Neurologic:  Alert and  oriented x4;  grossly normal neurologically. Psych:  Alert and cooperative. Normal mood and affect.  Impression & Recommendations:  Problem # 1:  ABDOMINAL PAIN, UNSPECIFIED SITE (ICD-70.30)  70 year old pleasant Caucasian female with  reported lower abdominal pain, lower back pain since Thanksgiving that at times "doubles her over". One episode of complete relief after large BM. Reports constipation. No melena or brbpr. No anemia, lipase and amylase nl. Does have hx of breast ca, and she is due for repeat screening colonoscopy in April 2012. Due to change in bowel habits, increased pain, warrants sooner colonoscopy. Likely r/t constipation, but malignancy can't be excluded.   Probiotic daily Miralax as needed Check hepatic function panel (none in record) TCS with Dr. Jena Gauss: the risks, benefits, and alternatives have been discussed in detail with the pt. She states understanding.   Orders: Consultation Level III ([id number])  Problem # 2:  DYSPHAGIA (ICD-787.29)   +dysphagia, does have known hx of reflux, but taking prilosec intermittently. Unsure how long symptoms of dysphagia have been occurring. Denies loss of appetite. Will add EGD with possible ED to TCS, daily PPI.   Prilosec 20 mg by mouth daily 30 minutes before first meal EGD/ED (along with TCS) with Dr. Jena Gauss: R/B/A discussed; pt states full understanding.   Orders: Consultation Level III [phone])  Other Orders: T-Hepatic Function [phone])  Patient Instructions: 1)  Take Prilosec 20 mg by mouth daily, 30 minutes before breakfast 2)  Take a probiotic of choice daily 3)  Miralax daily as needed for constipation 4)  The medication list was reviewed and reconciled.  All changed / newly prescribed medications were explained.  A complete medication list was provided to the patient / caregiver.   Appended Document: ?GALLSTONES,?PANCREATITIS,HURTING IN  SIDES TO STOMACH/SS did pt get  labs done? Hepatic function.   Appended Document: ?GALLSTONES,?PANCREATITIS,HURTING IN  SIDES TO STOMACH/SS LMOM did she get labs done?  Appended Document: ?GALLSTONES,?PANCREATITIS,HURTING IN  SIDES TO STOMACH/SS Lab has been done.

## 2010-05-26 NOTE — Letter (Signed)
Summary: TCS/EGD/ED ORDER  TCS/EGD/ED ORDER   Imported By: Ave Filter 05/11/2010 15:22:35  _____________________________________________________________________  External Attachment:    Type:   Image     Comment:   External Document

## 2010-05-28 NOTE — Op Note (Signed)
[name], [name]                ACCOUNT NO.:  [id number]  MEDICAL RECORD NO.:  [id number]           PATIENT TYPE:  O  LOCATION:  DAY                           FACILITY:  [facility]  PHYSICIAN:  R. Roetta Sessions, M.D. DATE OF BIRTH:  24-Aug-1940  DATE OF PROCEDURE:  05/25/2010 DATE OF DISCHARGE:                              OPERATIVE REPORT   PROCEDURE:  EGD with [name] dilation followed by biopsy, followed by diagnostic colonoscopy.  INDICATIONS FOR PROCEDURE:  A 70 year old lady with worsening reflux symptoms, esophageal dysphagia, and worsening constipation.  She describes significant back pain from time to time and radiates around both flanks and to the front, all the way into the abdomen in a band like fashion.  If she does not move she does not have pain.  She notes having a good bowel movement lessens and nearly completely alleviates these symptoms, but not every time she has a bowel movement, has not had any rectal bleeding, may go upwards of a week without a bowel movement. She is not on any substantial bowel regimen.  Positive family history of colon polyps in first-degree relative.  EGD now being done to further evaluate her symptoms.  Colonoscopy also being done.  Risks, benefits, limitations, alternatives, imponderables have been reviewed, questions answered, all parties agreeable.  Please note labs from April 27, 2010, H and H normal at 15.6 and 45.1, amylase 46, lipase 29.  LFTs recently done revealed a minimally elevated ALT of 50, otherwise normal.  Please see the documentation in the medical record for more information.  PROCEDURE NOTE:  O2 saturation, blood pressure, pulse, respirations were monitored throughout the entire procedure.  CONSCIOUS SEDATION:  Versed 6 mg IV, Demerol 125 mg IV in divided doses.  INSTRUMENT:  Pentax video chip system.  FINDINGS:  EGD examination of tubular esophagus revealed distal esophageal erosion straddling the EG junction, the  longest being approximately 5 mm.  There was no Barrett's esophagus.  There was no ring stricture or neoplasm.  The EG junction was easily traversed. There was no evidence of Barrett's esophagus.  Stomach:  Gas cavity was emptied and insufflated well with air. Thorough examination of gastric mucosa including retroflexion of proximal stomach, esophagogastric junction demonstrated a 5 x 10 mm gastric ulcer on the distal greater curvature/antral area.  This appeared to be a benign lesion.  Please see multiple photographs.  The patient was noted to have a small hiatal hernia on retroflexion  exam. Remainder of the gastric mucosa appeared unremarkable.  Pylorus was patent, easily traversed.  Examination of bulb and second portion revealed no abnormalities.  THERAPEUTIC/DIAGNOSTIC MANEUVERS PERFORMED:  Scope was withdrawn.  A 56- French Maloney dilator was passed to full insertion with ease.  A look back revealed no apparent complication with passage of the dilator. Subsequent biopsies also were taken with minimal bleeding.  The patient tolerated this procedure well and was prepared for colonoscopy.  Digital rectal exam revealed no abnormalities.  Endoscopic findings:  Prep was relatively poor (see below).  The scope was advanced from the rectosigmoid junction through the left transverse right colon to the appendiceal  orifice, ileocecal valve/cecum.  These structures were seen and photographed for the record.  From this level, scope was slowly withdrawn.  All previously mucosal surfaces were again seen.  The colonic mucosa appeared grossly normal.  The colonic effluent was greasy producing a film on the scope lens which made it difficult to see, it would not wash off very well.  This compromised exam.  No gross polyp or neoplasm was seen.  However, scope was pulled down to the rectum where a thorough examination of rectal mucosa including retroflexed view of the anal verge demonstrated only  some internal hemorrhoids.  The patient tolerated both procedures well.  Cecal withdrawal time 20 minutes.  IMPRESSION: 1. EGD distal esophageal erosions consistent with mild erosive reflux     esophagitis, otherwise unremarkable esophagus status post passage     of a 56-French Maloney dilator. 2. Small hiatal hernia. 3. Gastric ulcer as described above status post biopsy.  Patent     pylorus, normal D1 and D2.  Colonoscopy findings, internal     hemorrhoids, otherwise normal rectum and colon.  Poor prep     compromised the exam.  RECOMMENDATIONS: 1. Begin omeprazole 20 mg orally twice daily before breakfast and     supper.  She is to take omeprazole as a scheduled agent every day. 2. Follow up on path, likely to repeat EGD in 3 months. 3. Bowel regimen in the way of MiraLax 17 g orally at bedtime every     night p.r.n.  No good bowel movement on any given day. 4. She should return for repeat colonoscopy in 5 years. 5. Followup appointment with [place] in one month, so we can assess her     progress.     Jonathon Bellows, M.D.     RMR/MEDQ  D:  05/25/2010  T:  05/25/2010  Job:  [id number]  cc:   Catalina Pizza, M.D. Fax: [phone]  Gretta Cool, M.D. Fax: [phone]  Electronically Signed by Lorrin Goodell M.D. on 05/26/2010 09:31:48 AM

## 2010-08-02 LAB — BLOOD GAS, ARTERIAL
Bicarbonate: 26.8 mEq/L — ABNORMAL HIGH (ref 20.0–24.0)
TCO2: 23.3 mmol/L (ref 0–100)
pCO2 arterial: 38.2 mmHg (ref 35.0–45.0)
pH, Arterial: 7.46 — ABNORMAL HIGH (ref 7.350–7.400)
pO2, Arterial: 79.4 mmHg — ABNORMAL LOW (ref 80.0–100.0)

## 2010-08-10 ENCOUNTER — Encounter (HOSPITAL_COMMUNITY): Payer: Medicare Other | Attending: Oncology

## 2010-08-10 DIAGNOSIS — C50919 Malignant neoplasm of unspecified site of unspecified female breast: Secondary | ICD-10-CM

## 2010-08-10 DIAGNOSIS — Z853 Personal history of malignant neoplasm of breast: Secondary | ICD-10-CM | POA: Insufficient documentation

## 2010-08-10 DIAGNOSIS — Z09 Encounter for follow-up examination after completed treatment for conditions other than malignant neoplasm: Secondary | ICD-10-CM | POA: Insufficient documentation

## 2010-08-11 ENCOUNTER — Encounter (HOSPITAL_COMMUNITY): Payer: Medicare Other

## 2010-08-11 DIAGNOSIS — C50919 Malignant neoplasm of unspecified site of unspecified female breast: Secondary | ICD-10-CM

## 2010-08-12 ENCOUNTER — Ambulatory Visit (HOSPITAL_COMMUNITY): Payer: Self-pay | Admitting: Oncology

## 2010-08-17 ENCOUNTER — Encounter: Payer: Self-pay | Admitting: Internal Medicine

## 2010-08-25 ENCOUNTER — Encounter: Payer: Self-pay | Admitting: Internal Medicine

## 2010-08-29 ENCOUNTER — Ambulatory Visit: Payer: Medicare Other | Admitting: Gastroenterology

## 2010-09-06 NOTE — Procedures (Signed)
[name], RONK NO.:  [id number]   MEDICAL RECORD NO.:  [id number]          PATIENT TYPE:  OUT   LOCATION:  SLEEP LAB                     FACILITY:  [facility]   PHYSICIAN:  Barbaraann Share, MD,FCCPDATE OF BIRTH:  1940-08-18   DATE OF STUDY:  07/07/2007                            NOCTURNAL POLYSOMNOGRAM   REFERRING PHYSICIAN:  Franchot Heidelberg, M.D.   LOCATION:  Sleep lab.   REFERRING PHYSICIAN:  Franchot Heidelberg, M.D.   INDICATION FOR STUDY:  Hypersomnia with sleep apnea.   EPWORTH SLEEPINESS SCORE:  17   SLEEP ARCHITECTURE:  The patient had total sleep time of 350 minutes  with very little slow wave sleep and REM.  Sleep onset latency was  normal, as was REM onset.  Sleep efficiency was decreased at 87%.   RESPIRATORY DATA:  The patient was found to have 29 apneas and 66  hypopneas for an apnea/hypopnea index of 16 events per hour.  The events  occurred primarily during REM, and there was very loud snoring noted  throughout.   OXYGEN DATA:  There was O2 desaturation as low as 77% with the patient's  obstructive events.   CARDIAC DATA:  Rare PVCs, but no clinically significant arrhythmia.   MOVEMENT-PARASOMNIA:  The patient was found to have 539 periodic leg  movements, with only one per hour resulting in arousal or awakening.   IMPRESSIONS-RECOMMENDATIONS:  1. Mild obstructive sleep apnea/hypopnea syndrome with an      apnea/hypopnea index of 16 events per hour and O2 desaturation as      low as 77%.  The events occurred primarily during rapid eye      movement.  Treatment for this degree of sleep apnea can include      weight loss alone if applicable, upper airway surgery, oral      appliance, and also CPAP.  Clinical correlation is suggested.  2. Rare premature ventricular contractions without significant      arrhythmias.  3. Very large numbers of leg jerks with very little sleep disruption.      Barbaraann Share, MD,FCCP  Diplomate,  American Board of Sleep  Medicine  Electronically Signed     [facility]/MEDQ  D:  07/30/2007 16:24:30  T:  07/30/2007 20:43:56  Job:  [id number]

## 2010-09-09 NOTE — Procedures (Signed)
NAMECATHERYN, [name]                ACCOUNT NO.:  [id number]   MEDICAL RECORD NO.:  [id number]          PATIENT TYPE:  OUT   LOCATION:  RESP                          FACILITY:  [facility]   PHYSICIAN:  Edward L. Juanetta Gosling, M.D.DATE OF BIRTH:  [birth year]   DATE OF PROCEDURE:  DATE OF DISCHARGE:                            PULMONARY FUNCTION TEST   1. Spirometry is normal.  2. Lung volumes show somewhat elevated total lung capacity and      residual volume is also high.  This could represent air trapping.  3. DLCO is minimally reduced.  4. Arterial blood gas is normal.      Edward L. Juanetta Gosling, M.D.  Electronically Signed     ELH/MEDQ  D:  09/22/2008  T:  09/23/2008  Job:  [id number]   cc:   Franchot Heidelberg, M.D.

## 2010-09-09 NOTE — Op Note (Signed)
NAMENEVEYAH, [name] NO.:  [id number]   MEDICAL RECORD NO.:  [id number]          PATIENT TYPE:  INP   LOCATION:  A306                          FACILITY:  [facility]   PHYSICIAN:  Dalia Heading, M.D.  DATE OF BIRTH:  04/20/1941   DATE OF PROCEDURE:  05/19/2005  DATE OF DISCHARGE:                                 OPERATIVE REPORT   PREOPERATIVE DIAGNOSIS:  Right breast carcinoma, left axillary  lymphadenopathy.   POSTOPERATIVE DIAGNOSIS:  Right breast carcinoma, left axillary  lymphadenopathy.   PROCEDURE:  Right modified radical mastectomy, left axillary lymph node  biopsy.   SURGEON:  Dr. Franky Macho.   ASSISTANT:  Dr. Arna Snipe.   ANESTHESIA:  General endotracheal.   INDICATIONS:  The patient is a 70 year old white female status post a left  modified radical mastectomy in 1999 who now presents with a right breast  cancer. This was confirmed by biopsy. MRI of the breast reveals diffuse  enhancement of the entire central area of the right breast as well as a  swollen lymph node in the left axilla. The patient now comes to the  operating room for right modified radical mastectomy as well as a left  axillary lymph node biopsy. Risks and benefits of both procedures including  bleeding, infection, cardiopulmonary difficulties and the possibility of  blood transfusion were fully explained to the patient, who gave informed  consent.   PROCEDURE NOTE:  The patient was placed in the supine position. After  induction of general endotracheal anesthesia, the chest and axilla for  prepped and draped using the usual sterile technique with Betadine. Surgical  site confirmation was performed.   An elliptical incision was made around the nipple the right breast. This was  taken medial lateral. A superior flap was then formed up to the clavicle and  an inferior flap formed of the chest wall. The breast as well as the  underlying fascia was removed from the pectoralis  major muscle as well as  the chest wall using Bovie electrocautery. Any bleeding was controlled using  Bovie electrocautery. A Level 2 axillary dissection was performed. Care was  taken to avoid the long thoracic or thoracodorsal nerves. The intercostal  brachial nerve was spared as best as could be determined. Four palpable  swollen lymph nodes were noted and were also removed in the right axilla.  The wound was then copiously irrigated with normal saline. The specimen sent  to pathology further examination. A suture was placed medially on the right  breast specimen for orientation. Number 10 flat Jackson-Pratt drains were  placed into the axilla and underneath the breast flap. The superior drain  was placed in the axilla, and the inferior drain was placed under the flap.  Both were secured at the skin level using 3-0 nylon interrupted sutures.  Subcutaneous layer was reapproximated using 2-0 Vicryl interrupted sutures.  The skin was closed using staples. Betadine ointment and dry sterile  dressing were applied.   Next, a left axillary lymph node biopsy was performed. Incision was made  just lateral to the previous mastectomy  incision site in the left axilla.  The lymph node was found and sent to pathology for further examination. Any  bleeding was controlled using Bovie electrocautery. The subcutaneous layer  was reapproximated using 3-0 Vicryl interrupted sutures. Skin was closed  using staples. Betadine ointment and dry sterile dressings were applied.   All tape and needle counts were correct at the end of the procedure. The  patient was extubated in the operating room and went back to recovery room  awake in stable condition.   COMPLICATIONS:  None.   SPECIMEN:  1.  Right breast tissue and axillary contents.  2.  Left axillary lymph node.   DRAINS:  Superior drain to right axilla, inferior drain to right breast  flap.   BLOOD LOSS:  Minimal.      Dalia Heading, M.D.   Electronically Signed     MAJ/MEDQ  D:  05/19/2005  T:  05/19/2005  Job:  [id number]   cc:   Melvyn Novas, MD  Fax: [phone]   Ladona Horns. Mariel Sleet, MD  Fax: [phone]

## 2010-09-09 NOTE — Discharge Summary (Signed)
NAME:  [name], [name] NO.:  [id number]   MEDICAL RECORD NO.:  [id number]          PATIENT TYPE:  INP   LOCATION:  A306                          FACILITY:  [facility]   PHYSICIAN:  Dalia Heading, M.D.  DATE OF BIRTH:  12-Aug-1940   DATE OF ADMISSION:  05/19/2005  DATE OF DISCHARGE:  01/28/2007LH                                 DISCHARGE SUMMARY   HOSPITAL COURSE:  The patient is a 70 year old, white female who presented  to [facility] for a right modified radical mastectomy as well as a  left axillary lymph node biopsy.  She was diagnosed preoperatively with  right breast carcinoma.  She is status post left modified radical mastectomy  in the remote past for a left breast carcinoma.  She underwent her surgery  on May 19, 2005.  She tolerated procedure well.  Her postoperative  course has been for the most part unremarkable.  Her diet was advanced  without difficulty.  Final pathology is still pending.  Her flap drain was  removed prior to discharge.  She still has a right axillary drain in place.   The patient is being discharged home on postop day #2 in good and improving  condition.   FOLLOW UP:  The patient is to follow up Dr. Franky Macho on May 25, 2005.   SPECIAL INSTRUCTIONS:  She is to drain and record her bulb suction twice a  day.   DISCHARGE MEDICATIONS:  1.  Darvocet-N 100 one tablet p.o. q.4h. p.r.n. pain.  2.  She is resume all her other medications as previously prescribed.   DISCHARGE DIAGNOSES:  1.  Right breast carcinoma.  2.  History of left breast carcinoma.  3.  Left axillary lymphadenopathy.  4.  Hypertension.  5.  Chronic obstructive pulmonary disease.  6.  Hypothyroidism.   PRINCIPAL PROCEDURE:  Right modified radical mastectomy, left axillary lymph  node biopsy on May 19, 2005.      Dalia Heading, M.D.  Electronically Signed     MAJ/MEDQ  D:  05/21/2005  T:  05/22/2005  Job:  [id number]   cc:   Ladona Horns.  Mariel Sleet, MD  Fax: [phone]   Melvyn Novas, MD  Fax: [phone]

## 2010-09-09 NOTE — H&P (Signed)
[name], HEISER                ACCOUNT NO.:  [id number]   MEDICAL RECORD NO.:  [id number]          PATIENT TYPE:  AMB   LOCATION:  DAY                           FACILITY:  [facility]   PHYSICIAN:  Dalia Heading, M.D.  DATE OF BIRTH:  Nov 12, 1940   DATE OF ADMISSION:  DATE OF DISCHARGE:  LH                                HISTORY & PHYSICAL   CHIEF COMPLAINT:  Right breast carcinoma.   HISTORY OF PRESENT ILLNESS:  The patient is a 70 year old white female,  status post a left modified radical mastectomy in 1999 for a lobular  carcinoma, treated with tamoxifen, who now presents with a right breast  cancer.  This has been confirmed by biopsy.  MRI of the breast reveals  diffuse enhancement of the entire central area of the right breast.  There  was also the question of a nodule in the left lateral aspect of the chest  wall along the inferior aspect of the left axilla which has been biopsied  and is negative for malignancy.  A benign lymph node was found.  Due to her  history of breast cancer, the patient now presents for a right modified  radical mastectomy.   PAST MEDICAL HISTORY:  1.  Hypertension.  2.  COPD.  3.  Hypothyroidism.   PAST SURGICAL HISTORY:  1.  Left modified radical mastectomy.  2.  Maxillary sinus surgery.  3.  Left leg surgery in the remote past.   CURRENT MEDICATIONS:  1.  Micardis with hydrochlorothiazide one tablet p.o. every day.  2.  Lipitor 20 mg p.o. every day.  3.  Synthroid one tablet p.o. every day.   ALLERGIES:  SULFA.   REVIEW OF SYSTEMS:  The patient smokes a half pack of cigarettes a day.  She  denies significant alcohol use.  She denies any other cardiopulmonary  difficulties or bleeding disorders.   PHYSICAL EXAMINATION:  GENERAL:  The patient is a well-developed, well-  nourished, white female in no acute distress.  NECK:  Supple without lymphadenopathy.  LUNGS:  Clear to auscultation with equal breath sounds bilaterally.  HEART:   Reveals a regular rate and rhythm without S3, S4, or murmurs.  BREASTS:  The left breast examination reveals a surgical scar transversely  across the chest wall.  No breast is present.  No masses are noted.  The  right breast examination reveals an ecchymotic area at the 9 o'clock  position where a previous core biopsy had been performed.  The axilla is  negative for palpable nodes.  No nipple discharge or dimpling is noted.   IMPRESSION:  Right breast carcinoma.   PLAN:  The patient is scheduled for a right modified radical mastectomy on  May 19, 2005.  The risks and benefits of the procedure including  bleeding, infection, nerve injury, and arm swelling were fully explained to  the patient, who gave informed consent.      Dalia Heading, M.D.  Electronically Signed     MAJ/MEDQ  D:  05/16/2005  T:  05/16/2005  Job:  [id number]   cc:  Dalia Heading, M.D.  Fax: [phone]   [facility]  Fax: [phone]   Ladona Horns. Mariel Sleet, MD  Fax: [phone]   Delbert Harness, MD

## 2010-09-09 NOTE — Op Note (Signed)
NAME:  ESTLE, SABELLA                          ACCOUNT NO.:  [id number]   MEDICAL RECORD NO.:  [id number]                   PATIENT TYPE:  AMB   LOCATION:  DAY                                  FACILITY:  [facility]   PHYSICIAN:  R. Roetta Sessions, M.D.              DATE OF BIRTH:  January 10, 1941   DATE OF PROCEDURE:  DATE OF DISCHARGE:                                 OPERATIVE REPORT   PROCEDURE:  Colonoscopy with snare polypectomy.   ENDOSCOPIST:  Gerrit Friends. Rourk, M.D.   INDICATIONS FOR PROCEDURE:  The patient is a 70 year old lady referred by  Dr. Arna Snipe for colorectal cancer screening.  Personal history of  breast cancer.  No GI symptoms.  No prior imaging of her lower GI tract.  No  family history of colorectal neoplasia.  Colonoscopy is now being done as a  high-risk screening maneuver.  This approach has been discussed with the  patient at the bedside at length.  The potential risks, benefits, and  alternatives have been reviewed.  Questions answered.  ASA 2.   DESCRIPTION OF PROCEDURE:  O2 saturation, blood pressure, pulse and  respirations were monitored throughout the entire procedure.   CONSCIOUS SEDATION:  Versed 4 mg IV, Demerol 100 mg IV in divided doses.   INSTRUMENT:  Olympus video chip adult colonoscope.   FINDINGS:  Digital rectal exam revealed no abnormalities.   ENDOSCOPIC FINDINGS:  The prep was good.   RECTUM:  Examination of the rectal mucosa including the retroflex view of  the anal verge revealed only internal hemorrhoids.   COLON:  The colonic mucosa was surveyed from the rectosigmoid junction  through the left transverse and right colon to the area of the appendiceal  orifice, ileocecal valve, and cecum.  These structures were well seen and  photographed for the record.  The only abnormalities noted were two 5-mm  polyps, one at 25 cm and one at 45-cm.   From the level of the cecum and ileocecal valve the scope was slowly and  cautiously withdrawn.   All previously mentioned mucosal surfaces were again  seen; no other abnormalities were observed.  The polyps at 45 cm and 25 cm  were removed and recovered with a snare without indicate.  The patient  tolerated the procedure well and was reacted in endoscopy.   IMPRESSION:  1. Internal hemorrhoids, otherwise normal rectum.  2. Polyps in the left colon resected with snare, as described above.  The     remainder of the colonic mucosa appeared normal.   RECOMMENDATIONS:  1. No aspirin or arthritis medications for 10 days.  2. Follow up on path.  3. Further recommendations to follow.  Jonathon Bellows, M.D.    RMR/MEDQ  D:  04/22/2002  T:  04/22/2002  Job:  [id number]   cc:   Bernerd Limbo. Leona Carry, M.D.  [address]  [place]  [place] [address]  Fax: [phone]   Ladona Horns. Neijstrom, M.D.  [address]  [place]  [place] [address]  Fax: [phone]

## 2010-09-09 NOTE — Op Note (Signed)
[place]  Patient:    [name], [name] Visit Number: [id number] MRN: [id number]          Service Type: DSU Location: [facility] Attending Physician:  Barbee Cough Dictated by:   Kinnie Scales. Annalee Genta, M.D. Proc. Date: 12/31/00 Admit Date:  12/31/2000                             Operative Report  PREOPERATIVE DIAGNOSIS: 1. Recurrent acute sinusitis. 2. Chronic nasal obstruction. 3. Bilateral inferior turbinate hypertrophy.  POSTOPERATIVE DIAGNOSIS: 1. Recurrent acute sinusitis. 2. Chronic nasal obstruction. 3. Bilateral inferior turbinate hypertrophy.  OPERATION PERFORMED: 1. Bilateral endoscopic sinus surgery with Insta Trak guidance consisting    of anterior ethmoidectomy and maxillary antrostomy. 2. Bilateral inferior turbinate intramural cautery.  SURGEON:  Kinnie Scales. Annalee Genta, M.D.  ANESTHESIA:  General endotracheal.  COMPLICATIONS:  None.  ESTIMATED BLOOD LOSS:  Approximately 100 cc.  The patient was transferred from the operating room to the recovery room in stable condition.  INDICATIONS FOR PROCEDURE:  [name] is a 69 year old white female who was referred by Dr. Glenford Peers for evaluation of recurrent acute sinusitis. The patient had multiple episodes of sinusitis which responded poorly to antibiotic therapy.  She had symptoms of chronic periorbital headache, nasal congestion and nasal obstruction.  She was treated with antibiotics and CT scanning was obtained.  This showed significant soft tissue thickening involving the anterior ethmoid and maxillary sinuses bilaterally with obstruction of the osteomeatal complex.  The patient also had a mild nasal septal deviation and inferior turbinate hypertrophy.  Given the patients history, examination and findings, I recommended that we undertake bilateral endoscopic sinus surgery and inferior turbinate reduction under general anesthesia.  Prior to surgery an [name] CT scan  was obtained.  Informed consent was obtained from the patient and the risks, benefits and possible complications of each of the above surgical procedures was discussed in detail.  She understood and concurred with our plan for surgery which was scheduled as above.  DESCRIPTION OF PROCEDURE:  The patient was brought to the operating room on December 31, 2000 and placed in supine position on the operating table. General endotracheal anesthesia was established without difficulty.  When the patient had been adequately anesthetized, she was injected with 11 cc of 1% lidocaine 1:100,000 dilution epinephrine injected in submucosal fashion along the uncinate process, middle turbinate, inferior turbinate and nasal septum bilaterally.  The patients nose was then packed with Afrin soaked cottonoid pledgets.  The Insta Trak computer assisted navigation head gear was applied and anatomic and surgical landmarks were confirmed.  The patient was prepped and draped in sterile fashion and the surgical procedure was begun with removal of the nasal packing and bilateral nasal endoscopy.  The patient was found to have a mildly deviated nasal septum with some lateralization of the middle turbinates and partial obstruction of the middle meatus bilaterally. There was no evidence of mass, polyp or purulent discharge at the time of the examination and the posterior nasopharynx was intact.  The surgical procedure was begun on the patients right hand side using a Therapist, nutritional and medializing the right middle turbinate.  The uncinate process was reflected medially and resected in its entirety with backbiting forceps.  Using the [employer] computer assisted navigation device for anatomic location, the procedure was begun with complete removal of the uncinate process with a microdebrider and the dissection of the ethmoid  bulla from inferior to superior removing the bulla and entering the anterior ethmoid air cells.   An anterior ethmoidectomy was performed, removing bony septations and disease thickened mucosa from the right anterior ethmoid sinus.  Attention was then turned to the lateral nasal wall and using a 30 degree telescope, the residual uncinate process was resected.  The natural ostium of the maxillary sinus was identified.  There was accessory ostium and the intervening soft tissue was removed creating a widely patent right maxillary ostium.  There was no evidence of active sinus infection within the right maxillary sinus at the time of the procedure.  Residual bony debris and mucosa was   resected using the microdebrider and this left a widely patent anterior ethmoid and maxillary sinus cavity on the right hand side.  Attention was then turned to the patients left hand side and a similar procedure was carried out under direct visualization using the Insta Trak for guidance.  The middle turbinate was medialized.  The uncinate process was resected, the anterior ethmoidectomy was performed.  Maxillary antrostomy was performed creating a widely patent ethmoid and maxillary sinus.  Posterior ethmoid air cells were entered on the left hand side.  There was no disease and no further dissection was carried out in that area.  Dissection was then carried superiorly on the anterior ethmoid air cells and the nasal frontal recess was identified but not implemented.  The left maxillary sinus was intact without evidence of active infection or polypoid disease.  Middle turbinates were in good medial position and with resection of the uncinate process, the middle meatus and common ethmoid cavities were widely patent.  No packing or splints were placed.  Bilateral inferior turbinate intramural cautery was then performed.  The turbinates were cauterized with the bipolar cautery set at 12 watts and several submucosal passes were made in each middle turbinate when the turbinates had been adequately cauterized  or outfractured to create a more patent nasal cavity.  The patients nasal cavity and nasopharynx were then  irrigated and suctioned.  There was no active bleeding.  The oral cavity and oropharynx were examined.  There were no loose or broken teeth.  Blood was suctioned from the oropharynx and an orogastric tube was passed.  Th stomach contents were aspirated.  The patient was awakened from her anesthetic.  She was then transferred from the operating room to the recovery room in stable condition. Dictated by:   Kinnie Scales. Annalee Genta, M.D. Attending Physician:  Barbee Cough DD:  12/31/00 TD:  12/31/00 Job: [id number]

## 2010-09-09 NOTE — Op Note (Signed)
[name], [name]                ACCOUNT NO.:  [id number]   MEDICAL RECORD NO.:  [id number]          PATIENT TYPE:  AMB   LOCATION:  DAY                           FACILITY:  [facility]   PHYSICIAN:  R. Roetta Sessions, M.D. DATE OF BIRTH:  May 27, 1940   DATE OF PROCEDURE:  08/02/2005  DATE OF DISCHARGE:                                 OPERATIVE REPORT   PROCEDURE PERFORMED:  Colonoscopy.   INDICATIONS FOR PROCEDURE:  The patient is a 70 year old Caucasian female  with a history of colonic adenomatous polyps.  Last colonoscopy was in 2004.  She now has a personal history of breast cancer and a positive family  history of colorectal neoplasia.  She was to have a follow-up colonoscopy  next year but Drs. Neijstrom and DonDiego wished her to have it now.  She  has no lower GI tract symptoms.  Colonoscopy is now being done.  This  approach has been discussed with the patient at length.  Potential risks,  benefits and alternatives have been reviewed, questions have been answered,  she is agreeable.  Please see documentation in the medical records.   PROCEDURE NOTE:  Oxygen saturations, blood pressure, pulse and respirations  were monitored throughout the entire procedure.  Conscious sedation Versed  6 mg IV, Demerol 125 mg IV in divided doses.   INSTRUMENT USED:  Olympus video chip system.   FINDINGS:  Digital exam revealed no abnormalities.   ENDOSCOPIC FINDINGS:  Prep was adequate.   Rectum:  Examination of the rectal mucosa including retroflex view of the  anal verge revealed no abnormalities aside from internal hemorrhoids.  Colon:  Colonic mucosa was surveyed from the rectosigmoid junction to the  left, transverse and right colon to the area of the appendiceal orifice and  ileocecal valve and cecum.  These structures were well seen and photographed  for the record.  From this level, the scope was slowly withdrawn.  All  previously mentioned mucosal surfaces were again seen.  The colonic  mucosa  appeared normal.  The patient tolerated the procedure well, was reacted in  endoscopy.   IMPRESSION:  1.  Minimal internal hemorrhoids, otherwise normal rectum.  2.  Normal colon.   RECOMMENDATIONS:  Repeat colonoscopy in 5 years.      Jonathon Bellows, M.D.  Electronically Signed     RMR/MEDQ  D:  08/02/2005  T:  08/02/2005  Job:  [id number]

## 2011-02-09 ENCOUNTER — Encounter (HOSPITAL_COMMUNITY): Payer: Medicare Other | Attending: Oncology

## 2011-02-09 DIAGNOSIS — Z79899 Other long term (current) drug therapy: Secondary | ICD-10-CM | POA: Insufficient documentation

## 2011-02-09 DIAGNOSIS — C50919 Malignant neoplasm of unspecified site of unspecified female breast: Secondary | ICD-10-CM | POA: Insufficient documentation

## 2011-02-09 DIAGNOSIS — Z853 Personal history of malignant neoplasm of breast: Secondary | ICD-10-CM

## 2011-02-09 LAB — COMPREHENSIVE METABOLIC PANEL
ALT: 27 U/L (ref 0–35)
Albumin: 3.7 g/dL (ref 3.5–5.2)
Alkaline Phosphatase: 114 U/L (ref 39–117)
BUN: 11 mg/dL (ref 6–23)
Chloride: 100 mEq/L (ref 96–112)
GFR calc Af Amer: >90 mL/min (ref >90)
Glucose, Bld: 108 mg/dL — ABNORMAL HIGH (ref 70–99)
Potassium: 3.6 mEq/L (ref 3.5–5.1)
Total Bilirubin: 0.4 mg/dL (ref 0.3–1.2)

## 2011-02-09 LAB — CBC
HCT: 40.6 % (ref 36.0–46.0)
Hemoglobin: 13.5 g/dL (ref 12.0–15.0)
WBC: 7.9 10*3/uL (ref 4.0–10.5)

## 2011-02-09 LAB — DIFFERENTIAL
Basophils Relative: 1 % (ref 0–1)
Eosinophils Absolute: 0.3 10*3/uL (ref 0.0–0.7)
Eosinophils Relative: 4 % (ref 0–5)
Monocytes Relative: 10 % (ref 3–12)
Neutrophils Relative %: 54 % (ref 43–77)

## 2011-02-09 NOTE — Progress Notes (Signed)
Labs drawn today for cbc/diff,cmp 

## 2011-02-10 ENCOUNTER — Encounter (HOSPITAL_BASED_OUTPATIENT_CLINIC_OR_DEPARTMENT_OTHER): Payer: Medicare Other | Admitting: Oncology

## 2011-02-10 ENCOUNTER — Encounter (HOSPITAL_COMMUNITY): Payer: Self-pay | Admitting: Oncology

## 2011-02-10 VITALS — BP 111/65 | HR 87 | Temp 97.9°F | Ht 63.0 in | Wt 187.0 lb

## 2011-02-10 DIAGNOSIS — Z853 Personal history of malignant neoplasm of breast: Secondary | ICD-10-CM

## 2011-02-10 DIAGNOSIS — C50919 Malignant neoplasm of unspecified site of unspecified female breast: Secondary | ICD-10-CM

## 2011-02-10 NOTE — Progress Notes (Signed)
Susan Heidelberg, MD [address] [place] [address]  1. Breast cancer  CBC, Differential, Comprehensive metabolic panel  2. HX, PERSONAL, MALIGNANCY, BREAST      CURRENT THERAPY: Status post mastectomy on 02/16/1998 for a left sided invasive lobular carcinoma stage I. Status post tamoxifen for 5 years for this cancer ending in November 2004. Status post resection of a right-sided invasive lobular carcinoma stage II on 05/19/2005 with mastectomy and status post Aromasin, but then switched to Arimidex due to cost, finishing as of February 2012.  INTERVAL HISTORY: [name] 70 y.o. female returns for  regular  visit for followup of  Bilateral invasive lobular carcinomas.  Left sided breast cancer was stage I and the right sided cancer was Stage II.  Patient denies any complaints.  The patient reports that she does have gallstones which gives her minimal symptoms but she's particular foods, namely greasy foods. She is considering having a cholecystectomy. She reports that if she does pursue this, she will also have her umbilical hernia repaired as well.   We spent a long time going over her past medical history which included her left lower extremity osteomyelitis.  Oncologically, the patient denies any complaints.  Past Medical History  Diagnosis Date  . Dysuria   . Gallstones   . Liver function study, abnormal   . Chest wall pain     ANTERIOR  . Eustachian tube dysfunction   . Snoring   . Hypoxemia   . Microscopic hematuria   . Incontinence   . Malaise and fatigue   . Personal history of colonic polyps   . Tobacco abuse   . Primary breast malignancy   . Arthritis   . Bronchitis   . Abnormal heart rhythms   . Osteopenia   . Hypothyroidism   . Hypertension   . Hyperlipidemia   . GERD (gastroesophageal reflux disease)   . Depression   . Anxiety   . Allergic rhinitis   . Gallstones   . Ulcer     has COLONIC POLYPS; HYPOTHYROIDISM; HYPERLIPIDEMIA;  ANXIETY DEPRESSION; TOBACCO ABUSE; CATARACT NOS; OTITIS MEDIA, CHRONIC; HYPERTENSION; ALLERGIC RHINITIS; GERD; GALLSTONES; MICROSCOPIC HEMATURIA; DEGENERATIVE JOINT DISEASE; ARTHRITIS; OSTEOPENIA; COUGH; INCONTINENCE; HYPOXEMIA; HX, PERSONAL, MALIGNANCY, BREAST; CONSTIPATION; Other dysphagia; and ABDOMINAL PAIN, UNSPECIFIED SITE on her problem list.     is allergic to sulfonamide derivatives.  [name] does not currently have medications on file.  Past Surgical History  Procedure Date  . Bilateral mastectomy   . Tonsillectomy   . Left leg surgery     MULTIPLE DUE TO FX  . Tonsilectomy, adenoidectomy, bilateral myringotomy and tubes   . Left rtc tear   . Colonoscopy 4/07    minimal internal hemorrhoids otherwise normal  . Vesicovaginal fistula closure w/ tah     excessive bleeding-benign  . Upper gastrointestinal endoscopy 2012    Denies any headaches, dizziness, double vision, fevers, chills, night sweats, nausea, vomiting, diarrhea, constipation, chest pain, heart palpitations, shortness of breath, blood in stool, black tarry stool, urinary pain, urinary burning, urinary frequency, hematuria.   PHYSICAL EXAMINATION  ECOG PERFORMANCE STATUS: 0 - Asymptomatic  Filed Vitals:   02/10/11 1201  BP: 111/65  Pulse: 87  Temp: 97.9 F (36.6 C)    GENERAL:alert, no distress, well nourished, well developed, comfortable, cooperative and smiling SKIN: skin color, texture, turgor are normal HEAD: Normocephalic EYES: normal EARS: External ears normal OROPHARYNX:mucous membranes are moist  NECK: supple, trachea midline LYMPH:  No epitrochlear nodes  noted BREAST:post-mastectomy site well healed and free of suspicious changes LUNGS: clear to auscultation and percussion HEART: regular rate & rhythm, no murmurs, no gallops, S1 normal and S2 normal ABDOMEN:abdomen soft, non-tender, obese, normal bowel sounds, reducible umbilical hernia appreciated  and no hepatosplenomegaly BACK: Back  symmetric, no curvature. EXTREMITIES:less then 2 second capillary refill, no joint deformities, effusion, or inflammation, no edema, no skin discoloration, no clubbing, no cyanosis, left lower extremity trauma from surgical repair of her osteomyelitis appreciated NEURO: alert & oriented x 3 with fluent speech, no focal motor/sensory deficits, gait normal  LABORATORY DATA: CBC    Component Value Date/Time   WBC 7.9 02/09/2011 0940   RBC 4.45 02/09/2011 0940   HGB 13.5 02/09/2011 0940   HCT 40.6 02/09/2011 0940   PLT 251 02/09/2011 0940   MCV 91.2 02/09/2011 0940   MCH 30.3 02/09/2011 0940   MCHC 33.3 02/09/2011 0940   RDW 12.9 02/09/2011 0940   LYMPHSABS 2.5 02/09/2011 0940   MONOABS 0.8 02/09/2011 0940   EOSABS 0.3 02/09/2011 0940   BASOSABS 0.1 02/09/2011 0940      Chemistry      Component Value Date/Time   NA 138 02/09/2011 0940   K 3.6 02/09/2011 0940   CL 100 02/09/2011 0940   CO2 29 02/09/2011 0940   BUN 11 02/09/2011 0940   CREATININE 0.54 02/09/2011 0940      Component Value Date/Time   CALCIUM 9.9 02/09/2011 0940   ALKPHOS 114 02/09/2011 0940   AST 19 02/09/2011 0940   ALT 27 02/09/2011 0940   BILITOT 0.4 02/09/2011 0940        ASSESSMENT:  1. bilateral breast cancers, status post bilateral mastectomy and 5 years worth of endocrine therapy for each cancer. 2. Cholecystitis 3. COPD, quit smoking 4. osteomyelitis of left leg treated by Dr. Daphine [facility] at [name] secondary to trauma. 5. BRCA1 and BRCA2 negative 6. umbilical hernia, asymptomatic 7. fatty infiltration of liver 8. peptic ulcer disease, being followed by Dr. Jena Ray.    PLAN:  1. lab work in 6 months time: CBC differential, complete metabolic panel 2. Return to clinic in 6 months time for followup. 3. I personally reviewed and went over laboratory results with the patient.    All questions were answered. The patient knows to call the clinic with any problems, questions or concerns. We can  certainly see the patient much sooner if necessary.   [name]

## 2011-02-10 NOTE — Patient Instructions (Signed)
[facility]  Discharge Instructions  RECOMMENDATIONS MADE BY THE CONSULTANT AND ANY TEST RESULTS WILL BE SENT TO YOUR REFERRING DOCTOR.   EXAM FINDINGS BY MD TODAY AND SIGNS AND SYMPTOMS TO REPORT TO CLINIC OR PRIMARY MD: doing well  MEDICATIONS PRESCRIBED: none   INSTRUCTIONS GIVEN AND DISCUSSED: Report any new lumps, bone pain or shortness of breath.  SPECIAL INSTRUCTIONS/FOLLOW-UP: Lab work Needed in 6 months and to see PA after lab results are back.   I acknowledge that I have been informed and understand all the instructions given to me and received a copy. I do not have any more questions at this time, but understand that I may call the [facility] at [facility] at [phone] during business hours should I have any further questions or need assistance in obtaining follow-up care.    __________________________________________  _____________  __________ Signature of Patient or Authorized Representative            Date                   Time    __________________________________________ Nurse's Signature

## 2011-05-08 ENCOUNTER — Other Ambulatory Visit: Payer: Self-pay | Admitting: Gynecology

## 2011-08-11 ENCOUNTER — Encounter (HOSPITAL_COMMUNITY): Payer: Medicare Other | Attending: Oncology

## 2011-08-11 DIAGNOSIS — F411 Generalized anxiety disorder: Secondary | ICD-10-CM | POA: Insufficient documentation

## 2011-08-11 DIAGNOSIS — J309 Allergic rhinitis, unspecified: Secondary | ICD-10-CM | POA: Insufficient documentation

## 2011-08-11 DIAGNOSIS — F17200 Nicotine dependence, unspecified, uncomplicated: Secondary | ICD-10-CM | POA: Insufficient documentation

## 2011-08-11 DIAGNOSIS — K648 Other hemorrhoids: Secondary | ICD-10-CM | POA: Insufficient documentation

## 2011-08-11 DIAGNOSIS — M25579 Pain in unspecified ankle and joints of unspecified foot: Secondary | ICD-10-CM | POA: Insufficient documentation

## 2011-08-11 DIAGNOSIS — I10 Essential (primary) hypertension: Secondary | ICD-10-CM | POA: Insufficient documentation

## 2011-08-11 DIAGNOSIS — M899 Disorder of bone, unspecified: Secondary | ICD-10-CM | POA: Insufficient documentation

## 2011-08-11 DIAGNOSIS — E785 Hyperlipidemia, unspecified: Secondary | ICD-10-CM | POA: Insufficient documentation

## 2011-08-11 DIAGNOSIS — K219 Gastro-esophageal reflux disease without esophagitis: Secondary | ICD-10-CM | POA: Insufficient documentation

## 2011-08-11 DIAGNOSIS — K279 Peptic ulcer, site unspecified, unspecified as acute or chronic, without hemorrhage or perforation: Secondary | ICD-10-CM | POA: Insufficient documentation

## 2011-08-11 DIAGNOSIS — F3289 Other specified depressive episodes: Secondary | ICD-10-CM | POA: Insufficient documentation

## 2011-08-11 DIAGNOSIS — C50919 Malignant neoplasm of unspecified site of unspecified female breast: Secondary | ICD-10-CM

## 2011-08-11 DIAGNOSIS — Z853 Personal history of malignant neoplasm of breast: Secondary | ICD-10-CM | POA: Insufficient documentation

## 2011-08-11 DIAGNOSIS — K7689 Other specified diseases of liver: Secondary | ICD-10-CM | POA: Insufficient documentation

## 2011-08-11 DIAGNOSIS — F329 Major depressive disorder, single episode, unspecified: Secondary | ICD-10-CM | POA: Insufficient documentation

## 2011-08-11 DIAGNOSIS — E039 Hypothyroidism, unspecified: Secondary | ICD-10-CM | POA: Insufficient documentation

## 2011-08-11 DIAGNOSIS — M949 Disorder of cartilage, unspecified: Secondary | ICD-10-CM | POA: Insufficient documentation

## 2011-08-11 DIAGNOSIS — Z860100 Personal history of colon polyps, unspecified: Secondary | ICD-10-CM | POA: Insufficient documentation

## 2011-08-11 DIAGNOSIS — Z09 Encounter for follow-up examination after completed treatment for conditions other than malignant neoplasm: Secondary | ICD-10-CM | POA: Insufficient documentation

## 2011-08-11 LAB — DIFFERENTIAL
Eosinophils Relative: 5 % (ref 0–5)
Lymphocytes Relative: 43 % (ref 12–46)
Lymphs Abs: 3.3 10*3/uL (ref 0.7–4.0)
Monocytes Absolute: 0.6 10*3/uL (ref 0.1–1.0)
Monocytes Relative: 8 % (ref 3–12)
Neutro Abs: 3.2 10*3/uL (ref 1.7–7.7)

## 2011-08-11 LAB — CBC
MCH: 29.9 pg (ref 26.0–34.0)
Platelets: 264 10*3/uL (ref 150–400)
RBC: 4.41 MIL/uL (ref 3.87–5.11)
RDW: 13.3 % (ref 11.5–15.5)

## 2011-08-11 LAB — COMPREHENSIVE METABOLIC PANEL
ALT: 24 U/L (ref 0–35)
AST: 20 U/L (ref 0–37)
Albumin: 3.7 g/dL (ref 3.5–5.2)
CO2: 30 mEq/L (ref 19–32)
Calcium: 9.8 mg/dL (ref 8.4–10.5)
Sodium: 138 mEq/L (ref 135–145)
Total Protein: 6.9 g/dL (ref 6.0–8.3)

## 2011-08-11 NOTE — Progress Notes (Signed)
Labs drawn today for cbc/diff.cmp 

## 2011-08-15 ENCOUNTER — Encounter (HOSPITAL_BASED_OUTPATIENT_CLINIC_OR_DEPARTMENT_OTHER): Payer: Medicare Other | Admitting: Oncology

## 2011-08-15 VITALS — BP 142/68 | HR 83 | Temp 97.8°F | Wt 189.8 lb

## 2011-08-15 DIAGNOSIS — Z853 Personal history of malignant neoplasm of breast: Secondary | ICD-10-CM

## 2011-08-15 DIAGNOSIS — M25579 Pain in unspecified ankle and joints of unspecified foot: Secondary | ICD-10-CM

## 2011-08-15 NOTE — Progress Notes (Signed)
Franchot Heidelberg, MD [address] [place] [address]  1. HX, PERSONAL, MALIGNANCY, BREAST  Cholecalciferol (VITAMIN D) 2000 UNITS tablet, Thiamine HCl (VITAMIN B-1 PO), CBC, Differential, Comprehensive metabolic panel    CURRENT THERAPY:Status post mastectomy on 02/16/1998 for a left sided invasive lobular carcinoma stage I. Status post tamoxifen for 5 years for this cancer ending in November 2004. Status post resection of a right-sided invasive lobular carcinoma stage II on 05/19/2005 with mastectomy and status post Aromasin, but then switched to Arimidex due to cost, finishing as of February 2012.   INTERVAL HISTORY: [name] 71 y.o. female returns for  regular  visit for followup of Bilateral invasive lobular carcinomas. Left sided breast cancer was stage I and the right sided cancer was Stage II.  The patient's only complaint is right ankle discomfort. She reports that this discomfort began approximately 3-4 weeks ago. She rates a scale 4/10 on the pain scale. She denies any trauma or falls.  On physical exam her right ankle is edematous compared to her left. There is no ecchymoses appreciated. There appears to be a small effusion associated with her lateral ankle. There is some tenderness on palpation.  I suggested the patient follow up with her primary care physician. In the meantime, she should keep her right foot elevated when possible, she has an air cast which she is utilizing, and she should utilize ice as well.  She is an appointment with her primary care physician within the next 3 weeks. She will followup with him.  I personally reviewed and went over laboratory results with the patient.  Her lab work is absolutely unremarkable. We will print her copies if she can provide this to her primary care physician.  She plans on going to [place] to visit [place] with her grandchild in the summer.  She had a nice [place] holiday with which she spent with her family.    Past Medical History  Diagnosis Date  . Dysuria   . Gallstones   . Liver function study, abnormal   . Chest wall pain     ANTERIOR  . Eustachian tube dysfunction   . Snoring   . Hypoxemia   . Microscopic hematuria   . Incontinence   . Malaise and fatigue   . Personal history of colonic polyps   . Tobacco abuse   . Primary breast malignancy   . Arthritis   . Bronchitis   . Abnormal heart rhythms   . Osteopenia   . Hypothyroidism   . Hypertension   . Hyperlipidemia   . GERD (gastroesophageal reflux disease)   . Depression   . Anxiety   . Allergic rhinitis   . Gallstones   . Ulcer     has COLONIC POLYPS; HYPOTHYROIDISM; HYPERLIPIDEMIA; ANXIETY DEPRESSION; TOBACCO ABUSE; CATARACT NOS; OTITIS MEDIA, CHRONIC; HYPERTENSION; ALLERGIC RHINITIS; GERD; GALLSTONES; MICROSCOPIC HEMATURIA; DEGENERATIVE JOINT DISEASE; ARTHRITIS; OSTEOPENIA; COUGH; INCONTINENCE; HYPOXEMIA; HX, PERSONAL, MALIGNANCY, BREAST; CONSTIPATION; Other dysphagia; and ABDOMINAL PAIN, UNSPECIFIED SITE on her problem list.     is allergic to sulfonamide derivatives.  [name] does not currently have medications on file.  Past Surgical History  Procedure Date  . Bilateral mastectomy   . Tonsillectomy   . Left leg surgery     MULTIPLE DUE TO FX  . Tonsilectomy, adenoidectomy, bilateral myringotomy and tubes   . Left rtc tear   . Colonoscopy 4/07    minimal internal hemorrhoids otherwise normal  . Vesicovaginal fistula closure w/ tah  excessive bleeding-benign  . Upper gastrointestinal endoscopy 2012    Denies any headaches, dizziness, double vision, fevers, chills, night sweats, nausea, vomiting, diarrhea, constipation, chest pain, heart palpitations, shortness of breath, blood in stool, black tarry stool, urinary pain, urinary burning, urinary frequency, hematuria.   PHYSICAL EXAMINATION  ECOG PERFORMANCE STATUS: 0 - Asymptomatic  Filed Vitals:   08/15/11 1334  BP: 142/68  Pulse: 83  Temp: 97.8  F (36.6 C)    GENERAL:alert, no distress, well nourished, well developed, comfortable, cooperative, obese and smiling SKIN: skin color, texture, turgor are normal, no rashes or significant lesions HEAD: Normocephalic, No masses, lesions, tenderness or abnormalities EYES: normal, EOMI, Conjunctiva are pink and non-injected EARS: External ears normal OROPHARYNX:lips, buccal mucosa, and tongue normal and mucous membranes are moist  NECK: supple, no adenopathy, thyroid normal size, non-tender, without nodularity, no stridor, non-tender, trachea midline LYMPH:  no palpable lymphadenopathy, no hepatosplenomegaly BREAST:bilateral post-mastectomy site well healed and free of suspicious changes LUNGS: clear to auscultation and percussion HEART: regular rate & rhythm, no murmurs, no gallops, S1 normal and S2 normal ABDOMEN:abdomen soft, non-tender, obese, normal bowel sounds, no masses or organomegaly and no hepatosplenomegaly BACK: Back symmetric, no curvature., Right scapular sebaceous cyst appreciated measuring approximately 0.75 cm round. It is not inflamed, erythematous, or tenderness the touch. EXTREMITIES:less then 2 second capillary refill, no joint deformities, effusion, or inflammation, no skin discoloration, no clubbing, no cyanosis, positive findings:  edema right sided ankle edema that is tender to palpation.  NEURO: alert & oriented x 3 with fluent speech, no focal motor/sensory deficits    LABORATORY DATA: CBC    Component Value Date/Time   WBC 7.5 08/11/2011 1141   RBC 4.41 08/11/2011 1141   HGB 13.2 08/11/2011 1141   HCT 39.9 08/11/2011 1141   PLT 264 08/11/2011 1141   MCV 90.5 08/11/2011 1141   MCH 29.9 08/11/2011 1141   MCHC 33.1 08/11/2011 1141   RDW 13.3 08/11/2011 1141   LYMPHSABS 3.3 08/11/2011 1141   MONOABS 0.6 08/11/2011 1141   EOSABS 0.4 08/11/2011 1141   BASOSABS 0.1 08/11/2011 1141      Chemistry      Component Value Date/Time   NA 138 08/11/2011 1141   K 3.6  08/11/2011 1141   CL 100 08/11/2011 1141   CO2 30 08/11/2011 1141   BUN 14 08/11/2011 1141   CREATININE 0.52 08/11/2011 1141      Component Value Date/Time   CALCIUM 9.8 08/11/2011 1141   ALKPHOS 99 08/11/2011 1141   AST 20 08/11/2011 1141   ALT 24 08/11/2011 1141   BILITOT 0.4 08/11/2011 1141        ASSESSMENT:  1. Bilateral breast cancers, status post bilateral mastectomy and 5 years worth of endocrine therapy for each cancer.  2. Cholecystitis  3. COPD, quit smoking  4. Osteomyelitis of left leg treated by Dr. Daphine Deutscher at [facility] secondary to trauma.  5. BRCA1 and BRCA2 negative  6. Umbilical hernia, asymptomatic  7. Fatty infiltration of liver  8. Peptic ulcer disease, being followed by Dr. Jena Gauss.     PLAN:  1. I personally reviewed and went over laboratory results with the patient. 2. Recommended she follow-up with PCP regarding right ankle swelling. 3. Recommended RICE for her edematous right ankle. 4. Lab work in 1 year: CBC diff, CMET 5. Lab work printed so she can provide it to her PCP for her yearly physical. 6. Return in 1 year for follow-up.   All questions were  answered. The patient knows to call the clinic with any problems, questions or concerns. We can certainly see the patient much sooner if necessary.  The patient and plan discussed with Glenford Peers, MD and he is in agreement with the aforementioned.  Tyr Franca

## 2011-08-15 NOTE — Patient Instructions (Signed)
[name]  [id number] March 21, 1941 Dr. Glenford Peers  [facility]  Discharge Instructions  RECOMMENDATIONS MADE BY THE CONSULTANT AND ANY TEST RESULTS WILL BE SENT TO YOUR REFERRING DOCTOR.   Exam is great! We will see you back next year.   I acknowledge that I have been informed and understand all the instructions given to me and received a copy. I do not have any more questions at this time, but understand that I may call the Specialty Clinic at [facility] at [phone] during business hours should I have any further questions or need assistance in obtaining follow-up care.    __________________________________________  _____________  __________ Signature of Patient or Authorized Representative            Date                   Time    __________________________________________ Nurse's Signature

## 2012-05-20 ENCOUNTER — Other Ambulatory Visit: Payer: Self-pay | Admitting: Gynecology

## 2012-08-13 ENCOUNTER — Encounter (HOSPITAL_COMMUNITY): Payer: Medicare Other | Attending: Oncology | Admitting: Oncology

## 2012-08-13 VITALS — BP 138/72 | HR 99 | Temp 98.3°F | Resp 18 | Wt 201.5 lb

## 2012-08-13 DIAGNOSIS — Z853 Personal history of malignant neoplasm of breast: Secondary | ICD-10-CM

## 2012-08-13 DIAGNOSIS — D0590 Unspecified type of carcinoma in situ of unspecified breast: Secondary | ICD-10-CM

## 2012-08-13 DIAGNOSIS — R635 Abnormal weight gain: Secondary | ICD-10-CM

## 2012-08-13 DIAGNOSIS — F17200 Nicotine dependence, unspecified, uncomplicated: Secondary | ICD-10-CM

## 2012-08-13 NOTE — Progress Notes (Signed)
#  1 left-sided invasive lobular carcinoma, stage I status post mastectomy 02/16/1998 followed by 5 years of tamoxifen ending in November 2004 #2 right-sided invasive lobular carcinoma, stage II with mastectomy 05/19/2005 followed by Aromasin, then switching to Arimidex due to cost issues finishing as of 2012 #3 excessive weight #4 gallstones #5 tobacco abuse still smoking approximately a car and every 3-4 months but she does not have a 30 pack year history smoking that she admits to.  Her oncology review of systems is otherwise noncontributory. She is not aware of lumps or bumps anywhere. She does needs mastectomy rales today. We also discussed her weight and her diet which is not good. Her weight was 189 pounds a year ago and now she's 201 pounds.  Her vital signs otherwise noncontributory. They're stable otherwise. Lymph nodes remain negative throughout. Both chest walls are clear. Lungs show slightly raspy breath sounds. Back is when I asked her about her smoking. I thought she did stop completely but she has not done so. Her heart showed regular rhythm and rate without murmur rub or gallop. Her left leg still has the old trauma changes. She has minimal puffiness at the ankle. Abdomen is obese without hepatosplenomegaly. Bowel sounds are normal to slightly diminished. There is no obvious ascites. She has no obvious skin rash. She has no obvious thyroid enlargement. HEENT exam is symmetrical.  We will do see her back in a year but thus far there is no evidence for recurrence. She truly does need to lose weight and watch what she is eating. We discussed her have and she is quite a bit of bread and excessive calories

## 2012-08-13 NOTE — Patient Instructions (Signed)
[facility] Discharge Instructions  RECOMMENDATIONS MADE BY THE CONSULTANT AND ANY TEST RESULTS WILL BE SENT TO YOUR REFERRING PHYSICIAN.  EXAM FINDINGS BY THE PHYSICIAN TODAY AND SIGNS OR SYMPTOMS TO REPORT TO CLINIC OR PRIMARY PHYSICIAN:   MEDICATIONS PRESCRIBED:  Order for Mastectomy bras.  SPECIAL INSTRUCTIONS/FOLLOW-UP: Return to see [place] in one year.  Please see the front desk for your appointments as you leave today.    Thank you for choosing [facility] to provide your oncology and hematology care.  To afford each patient quality time with our providers, please arrive at least 15 minutes before your scheduled appointment time.  With your help, our goal is to use those 15 minutes to complete the necessary work-up to ensure our physicians have the information they need to help with your evaluation and healthcare recommendations.    Effective January 1st, 2014, we ask that you re-schedule your appointment with our physicians should you arrive 10 or more minutes late for your appointment.  We strive to give you quality time with our providers, and arriving late affects you and other patients whose appointments are after yours.    Again, thank you for choosing [facility].  Our hope is that these requests will decrease the amount of time that you wait before being seen by our physicians.       _____________________________________________________________  Should you have questions after your visit to [facility], please contact our office at [phone] between the hours of 8:30 a.m. and 5:00 p.m.  Voicemails left after 4:30 p.m. will not be returned until the following business day.  For prescription refill requests, have your pharmacy contact our office with your prescription refill request.

## 2013-01-24 ENCOUNTER — Other Ambulatory Visit (HOSPITAL_COMMUNITY): Payer: Self-pay | Admitting: Internal Medicine

## 2013-01-24 DIAGNOSIS — K5900 Constipation, unspecified: Secondary | ICD-10-CM

## 2013-01-28 ENCOUNTER — Ambulatory Visit (HOSPITAL_COMMUNITY)
Admission: RE | Admit: 2013-01-28 | Discharge: 2013-01-28 | Disposition: A | Discharge status: Home / Self Care | Payer: Medicare Other | Source: Ambulatory Visit | Attending: Internal Medicine | Admitting: Internal Medicine

## 2013-01-28 DIAGNOSIS — R630 Anorexia: Secondary | ICD-10-CM | POA: Insufficient documentation

## 2013-01-28 DIAGNOSIS — K2289 Other specified disease of esophagus: Secondary | ICD-10-CM | POA: Insufficient documentation

## 2013-01-28 DIAGNOSIS — I10 Essential (primary) hypertension: Secondary | ICD-10-CM | POA: Insufficient documentation

## 2013-01-28 DIAGNOSIS — K228 Other specified diseases of esophagus: Secondary | ICD-10-CM | POA: Insufficient documentation

## 2013-01-28 DIAGNOSIS — D3500 Benign neoplasm of unspecified adrenal gland: Secondary | ICD-10-CM | POA: Insufficient documentation

## 2013-01-28 DIAGNOSIS — K8020 Calculus of gallbladder without cholecystitis without obstruction: Secondary | ICD-10-CM | POA: Insufficient documentation

## 2013-01-28 DIAGNOSIS — K5900 Constipation, unspecified: Secondary | ICD-10-CM | POA: Insufficient documentation

## 2013-01-28 MED ORDER — IOHEXOL 300 MG/ML  SOLN
100.0000 mL | Freq: Once | INTRAMUSCULAR | Status: AC | PRN
  Administered 2013-01-28: 100 mL via INTRAVENOUS

## 2013-03-04 ENCOUNTER — Ambulatory Visit (INDEPENDENT_AMBULATORY_CARE_PROVIDER_SITE_OTHER): Payer: Medicare Other | Admitting: Gastroenterology

## 2013-03-04 ENCOUNTER — Encounter (INDEPENDENT_AMBULATORY_CARE_PROVIDER_SITE_OTHER): Payer: Self-pay

## 2013-03-04 ENCOUNTER — Encounter: Payer: Self-pay | Admitting: Gastroenterology

## 2013-03-04 VITALS — BP 120/66 | HR 91 | Temp 98.1°F | Ht 63.0 in | Wt 204.2 lb

## 2013-03-04 DIAGNOSIS — K219 Gastro-esophageal reflux disease without esophagitis: Secondary | ICD-10-CM

## 2013-03-04 DIAGNOSIS — R1319 Other dysphagia: Secondary | ICD-10-CM

## 2013-03-04 DIAGNOSIS — K5900 Constipation, unspecified: Secondary | ICD-10-CM

## 2013-03-04 DIAGNOSIS — D126 Benign neoplasm of colon, unspecified: Secondary | ICD-10-CM

## 2013-03-04 MED ORDER — LINACLOTIDE 145 MCG PO CAPS: 145.0000 ug | ORAL_CAPSULE | Freq: Every day | ORAL | Status: DC

## 2013-03-04 NOTE — Assessment & Plan Note (Signed)
72 year old female with chronic GERD, hx of PUD with negative path for H.pylori in 2012, overdue for EGD surveillance. Recent CT showed esophageal wall-thickening, which is likely due to known erosive gastritis. Unable to exclude malignancy but less likely. She is hesitant to switch to a PPI and would rather continue Zantac until after an EGD. Vague esophageal dysphagia likely related to uncontrolled GERD, possible web, ring, or stricture. If no improvement with dilation, would recommend BPE.   Proceed with upper endoscopy and dilation in the near future with Dr. Jena Gauss. The risks, benefits, and alternatives have been discussed in detail with patient. They have stated understanding and desire to proceed.  BPE if further dysphagia Anticipate starting PPI after EGD: patient declined prescription for this despite recommendations.

## 2013-03-04 NOTE — Progress Notes (Signed)
 Referring Provider: Dr. Zach Grimley Primary GI: Dr. Rourk   CC: Needs EGD, possible colonoscopy  HPI:   [name] presents today at the request of Dr. Veazie secondary to abnormal CT in Oct 2014 showing distal esophageal wall thickening. Last seen in 2012 and underwent TCS/EGD/ED. Colonoscopy due again in 2017. EGD revealed a benign-appearing gastric ulcer. Path negative for H.pylori. She did not return for an appt to schedule surveillance EGD.  Presents today with frequent indigestion. Takes Zantac with relief. Can't pinpoint food triggers. Esophageal dysphagia. Feels like she has to quit eating so that food will slide down. States no improvement after last dilation in 2012. Food gets hung up, has to take small sips until it passes, then continue eating. Carbonated beverages worsen. No abdominal pain.   Significant constipation recently but denies abdominal pain. Resolved on own. No rectal bleeding. Feels like she is fine. BM every 3-4 days, sometimes small balls. However, wonders if her thyroid is not right. On Synthroid. Feels so sluggish at times. Was on 175 mcg, then dropped to 125 mcg, drawing more blood on 11/12.    Past Medical History  Diagnosis Date  . Dysuria   . Gallstones   . Liver function study, abnormal   . Chest wall pain     ANTERIOR  . Eustachian tube dysfunction   . Snoring   . Hypoxemia   . Microscopic hematuria   . Incontinence   . Malaise and fatigue   . Personal history of colonic polyps   . Tobacco abuse   . Primary breast malignancy   . Arthritis   . Bronchitis   . Abnormal heart rhythms   . Osteopenia   . Hypothyroidism   . Hypertension   . Hyperlipidemia   . GERD (gastroesophageal reflux disease)   . Depression   . Anxiety   . Allergic rhinitis   . Gallstones   . Ulcer     Past Surgical History  Procedure Laterality Date  . Bilateral mastectomy    . Tonsillectomy    . Left leg surgery      MULTIPLE DUE TO FX  . Tonsilectomy, adenoidectomy,  bilateral myringotomy and tubes    . Left rtc tear    . Colonoscopy  4/07    RMR:minimal internal hemorrhoids otherwise normal  . Vesicovaginal fistula closure w/ tah      excessive bleeding-benign  . Esophagogastroduodenoscopy  05/25/2010    RMR:EGD distal esophageal erosions consistent with mild erosive reflux esophagitis, otherwise unremarkable esophagus status post passage of a 56-French Maloney dilator/Small hiatal hernia/Gastric ulcer with negative H.pylori. Overdue for surveillance.   . Colonoscopy  05/25/2010    RMR:internal hemorrhoids, otherwise normal rectum and colon.(POOR PREP). Due for surveillance 2017.   . Colonoscopy  04/22/2002      RMR: Internal hemorrhoids, otherwise normal rectum  Polyps in the left colon resected with snare    Current Outpatient Prescriptions  Medication Sig Dispense Refill  . amLODipine (NORVASC) 10 MG tablet Take 10 mg by mouth daily.        . aspirin 81 MG tablet Take 81 mg by mouth daily.        . Calcium Carbonate-Vit D-Min (CALTRATE 600+D PLUS) 600-400 MG-UNIT per tablet Take 1 tablet by mouth 2 (two) times daily.        . Chelated Potassium 99 MG TABS Take 99 mg by mouth 1 dose over 46 hours.        . cholecalciferol (VITAMIN D) 1000 UNITS   tablet Take 2,000 Units by mouth daily.       . fish oil-omega-3 fatty acids 1000 MG capsule Take 2 g by mouth daily.        . levothyroxine (SYNTHROID, LEVOTHROID) 200 MCG tablet Take 125 mcg by mouth daily.       . lisinopril-hydrochlorothiazide (PRINZIDE,ZESTORETIC) 20-12.5 MG per tablet Take 1 tablet by mouth daily.        . pravastatin (PRAVACHOL) 40 MG tablet Take 40 mg by mouth daily.        . citalopram (CELEXA) 20 MG tablet Take 20 mg by mouth daily.        . Linaclotide (LINZESS) 145 MCG CAPS capsule Take 1 capsule (145 mcg total) by mouth daily. 30 minutes before breakfast  30 capsule  11  . Thiamine HCl (VITAMIN B-1 PO) Take 1 tablet by mouth daily.       No current facility-administered  medications for this visit.    Allergies as of 03/04/2013 - Review Complete 03/04/2013  Allergen Reaction Noted  . Sulfonamide derivatives  03/21/2006    Family History  Problem Relation Age of Onset  . Colon cancer Neg Hx     History   Social History  . Marital Status: Divorced    Spouse Name: N/A    Number of Children: N/A  . Years of Education: N/A   Social History Main Topics  . Smoking status: Current Every Day Smoker  . Smokeless tobacco: None     Comment: Smokes a pack of cigarettes about every 2-3 days  . Alcohol Use: No  . Drug Use: No  . Sexual Activity: None   Other Topics Concern  . None   Social History Narrative  . None    Review of Systems: As mentioned in HPI.   Physical Exam: BP 120/66  Pulse 91  Temp(Src) 98.1 F (36.7 C) (Oral)  Ht 5' 3" (1.6 m)  Wt 204 lb 3.2 oz (92.625 kg)  BMI 36.18 kg/m2 General:   Alert and oriented. No distress noted. Pleasant and cooperative.  Head:  Normocephalic and atraumatic. Eyes:  Conjuctiva clear without scleral icterus. Mouth:  Oral mucosa pink and moist. Good dentition. No lesions. Neck:  Supple, without mass or thyromegaly. Heart:  S1, S2 present without murmurs, rubs, or gallops. Regular rate and rhythm. Abdomen:  +BS, soft, non-tender and non-distended. No rebound or guarding. Easily reducible small umbilical hernia. Msk:  Symmetrical without gross deformities. Normal posture. Extremities:  Without edema. Neurologic:  Alert and  oriented x4;  grossly normal neurologically. Skin:  Intact without significant lesions or rashes. Cervical Nodes:  No significant cervical adenopathy. Psych:  Alert and cooperative. Normal mood and affect.  CT abd/pelvis Oct 2014:  Thickening of the wall of the distal esophagus, can be seen with  reflux and esophagitis, tumor less likely; correlation with  esophagram or EGD recommended.  Cholelithiasis.  Minimal pericardial effusion.  Small right inguinal and umbilical  hernias containing fat.  Small right adrenal adenoma.  

## 2013-03-04 NOTE — Patient Instructions (Signed)
We have scheduled you for an upper endoscopy with possible dilation if needed with Dr. Jena Gauss.   For constipation: start taking Linzess 1 capsule each morning, 30 minutes before breakfast. This can help regulate your bowel movements and keep you "ahead of the game".   Add supplemental fiber such as Metamucil or Benefiber daily.   We will see you back in 3 months.

## 2013-03-04 NOTE — Assessment & Plan Note (Signed)
Without rectal bleeding or other concerning factors. Chronic issue. Start Linzess 145 mcg daily as she has failed OTC agents. Provided voucher. Supplemental fiber daily. Return in 3 months.

## 2013-03-04 NOTE — Assessment & Plan Note (Signed)
Distant history of polyps. Due for surveillance 2017. No need for colonoscopy currently.

## 2013-03-04 NOTE — Assessment & Plan Note (Signed)
EGD planned with dilation. BPE in future as next step if needed.

## 2013-03-05 ENCOUNTER — Encounter (HOSPITAL_COMMUNITY): Payer: Self-pay | Admitting: Pharmacy Technician

## 2013-03-05 NOTE — Progress Notes (Signed)
cc'd to pcp 

## 2013-03-05 NOTE — Progress Notes (Signed)
Outside labs obtained from Nov 2014.   Hgb 13.7., Plts 286. Tbili 0.4, AP 84, AST 27, ALT 42, TSH 3.225.

## 2013-03-06 LAB — CBC
HCT: 40 %
HGB: 13.7 g/dL
Platelets: 286 10*3/uL
Total Bilirubin: 0.4 mg/dL

## 2013-03-10 ENCOUNTER — Ambulatory Visit (HOSPITAL_COMMUNITY)
Admission: RE | Admit: 2013-03-10 | Discharge: 2013-03-10 | Disposition: A | Discharge status: Home / Self Care | Payer: Medicare Other | Source: Ambulatory Visit | Attending: Internal Medicine | Admitting: Internal Medicine

## 2013-03-10 ENCOUNTER — Encounter (HOSPITAL_COMMUNITY)
Admission: RE | Disposition: A | Discharge status: Home / Self Care | Payer: Self-pay | Source: Ambulatory Visit | Attending: Internal Medicine

## 2013-03-10 ENCOUNTER — Encounter (HOSPITAL_COMMUNITY): Payer: Self-pay | Admitting: *Deleted

## 2013-03-10 DIAGNOSIS — R1310 Dysphagia, unspecified: Secondary | ICD-10-CM

## 2013-03-10 DIAGNOSIS — K2100 Gastro-esophageal reflux disease with esophagitis, without bleeding: Secondary | ICD-10-CM | POA: Insufficient documentation

## 2013-03-10 DIAGNOSIS — Q393 Congenital stenosis and stricture of esophagus: Secondary | ICD-10-CM

## 2013-03-10 DIAGNOSIS — K5900 Constipation, unspecified: Secondary | ICD-10-CM

## 2013-03-10 DIAGNOSIS — K219 Gastro-esophageal reflux disease without esophagitis: Secondary | ICD-10-CM

## 2013-03-10 DIAGNOSIS — D126 Benign neoplasm of colon, unspecified: Secondary | ICD-10-CM

## 2013-03-10 DIAGNOSIS — I10 Essential (primary) hypertension: Secondary | ICD-10-CM | POA: Insufficient documentation

## 2013-03-10 DIAGNOSIS — Q391 Atresia of esophagus with tracheo-esophageal fistula: Secondary | ICD-10-CM

## 2013-03-10 DIAGNOSIS — K2940 Chronic atrophic gastritis without bleeding: Secondary | ICD-10-CM | POA: Insufficient documentation

## 2013-03-10 DIAGNOSIS — R1319 Other dysphagia: Secondary | ICD-10-CM

## 2013-03-10 DIAGNOSIS — K2960 Other gastritis without bleeding: Secondary | ICD-10-CM

## 2013-03-10 DIAGNOSIS — K222 Esophageal obstruction: Secondary | ICD-10-CM | POA: Insufficient documentation

## 2013-03-10 HISTORY — PX: MALONEY DILATION: SHX5535

## 2013-03-10 HISTORY — PX: ESOPHAGOGASTRODUODENOSCOPY: SHX5428

## 2013-03-10 HISTORY — PX: SAVORY DILATION: SHX5439

## 2013-03-10 SURGERY — EGD (ESOPHAGOGASTRODUODENOSCOPY)
Anesthesia: Moderate Sedation

## 2013-03-10 MED ORDER — MIDAZOLAM HCL 5 MG/5ML IJ SOLN
INTRAMUSCULAR | Status: AC
  Filled 2013-03-10: qty 10

## 2013-03-10 MED ORDER — STERILE WATER FOR IRRIGATION IR SOLN
Status: DC | PRN
  Administered 2013-03-10: 12:00:00

## 2013-03-10 MED ORDER — MEPERIDINE HCL 100 MG/ML IJ SOLN
INTRAMUSCULAR | Status: DC | PRN
  Administered 2013-03-10: 25 mg via INTRAVENOUS
  Administered 2013-03-10: 50 mg via INTRAVENOUS

## 2013-03-10 MED ORDER — ONDANSETRON HCL 4 MG/2ML IJ SOLN
INTRAMUSCULAR | Status: AC
  Filled 2013-03-10: qty 2

## 2013-03-10 MED ORDER — SODIUM CHLORIDE 0.9 % IV SOLN
INTRAVENOUS | Status: DC
  Administered 2013-03-10: 11:00:00 via INTRAVENOUS

## 2013-03-10 MED ORDER — MIDAZOLAM HCL 5 MG/5ML IJ SOLN
INTRAMUSCULAR | Status: DC | PRN
  Administered 2013-03-10: 2 mg via INTRAVENOUS
  Administered 2013-03-10: 1 mg via INTRAVENOUS

## 2013-03-10 MED ORDER — ONDANSETRON HCL 4 MG/2ML IJ SOLN
INTRAMUSCULAR | Status: DC | PRN
  Administered 2013-03-10: 4 mg via INTRAVENOUS

## 2013-03-10 MED ORDER — MEPERIDINE HCL 100 MG/ML IJ SOLN
INTRAMUSCULAR | Status: AC
  Filled 2013-03-10: qty 2

## 2013-03-10 MED ORDER — BUTAMBEN-TETRACAINE-BENZOCAINE 2-2-14 % EX AERO
INHALATION_SPRAY | CUTANEOUS | Status: DC | PRN
  Administered 2013-03-10: 2 via TOPICAL

## 2013-03-10 NOTE — H&P (View-Only) (Signed)
Referring Provider: Dr. Catalina Pizza Primary GI: Dr. Jena Gauss   CC: Needs EGD, possible colonoscopy  HPI:   Ms. Flath presents today at the request of Dr. Margo Aye secondary to abnormal CT in Oct 2014 showing distal esophageal wall thickening. Last seen in 2012 and underwent TCS/EGD/ED. Colonoscopy due again in 2017. EGD revealed a benign-appearing gastric ulcer. Path negative for H.pylori. She did not return for an appt to schedule surveillance EGD.  Presents today with frequent indigestion. Takes Zantac with relief. Can't pinpoint food triggers. Esophageal dysphagia. Feels like she has to quit eating so that food will slide down. States no improvement after last dilation in 2012. Food gets hung up, has to take small sips until it passes, then continue eating. Carbonated beverages worsen. No abdominal pain.   Significant constipation recently but denies abdominal pain. Resolved on own. No rectal bleeding. Feels like she is fine. BM every 3-4 days, sometimes small balls. However, wonders if her thyroid is not right. On Synthroid. Feels so sluggish at times. Was on 175 mcg, then dropped to 125 mcg, drawing more blood on 11/12.    Past Medical History  Diagnosis Date  . Dysuria   . Gallstones   . Liver function study, abnormal   . Chest wall pain     ANTERIOR  . Eustachian tube dysfunction   . Snoring   . Hypoxemia   . Microscopic hematuria   . Incontinence   . Malaise and fatigue   . Personal history of colonic polyps   . Tobacco abuse   . Primary breast malignancy   . Arthritis   . Bronchitis   . Abnormal heart rhythms   . Osteopenia   . Hypothyroidism   . Hypertension   . Hyperlipidemia   . GERD (gastroesophageal reflux disease)   . Depression   . Anxiety   . Allergic rhinitis   . Gallstones   . Ulcer     Past Surgical History  Procedure Laterality Date  . Bilateral mastectomy    . Tonsillectomy    . Left leg surgery      MULTIPLE DUE TO FX  . Tonsilectomy, adenoidectomy,  bilateral myringotomy and tubes    . Left rtc tear    . Colonoscopy  4/07    QMV:HQIONGE internal hemorrhoids otherwise normal  . Vesicovaginal fistula closure w/ tah      excessive bleeding-benign  . Esophagogastroduodenoscopy  05/25/2010    RMR:EGD distal esophageal erosions consistent with mild erosive reflux esophagitis, otherwise unremarkable esophagus status post passage of a 56-French Maloney dilator/Small hiatal hernia/Gastric ulcer with negative H.pylori. Overdue for surveillance.   . Colonoscopy  05/25/2010    XBM:WUXLKGMW hemorrhoids, otherwise normal rectum and colon.(POOR PREP). Due for surveillance 2017.   . Colonoscopy  04/22/2002      RMR: Internal hemorrhoids, otherwise normal rectum  Polyps in the left colon resected with snare    Current Outpatient Prescriptions  Medication Sig Dispense Refill  . amLODipine (NORVASC) 10 MG tablet Take 10 mg by mouth daily.        Marland Kitchen aspirin 81 MG tablet Take 81 mg by mouth daily.        . Calcium Carbonate-Vit D-Min (CALTRATE 600+D PLUS) 600-400 MG-UNIT per tablet Take 1 tablet by mouth 2 (two) times daily.        . Chelated Potassium 99 MG TABS Take 99 mg by mouth 1 dose over 46 hours.        . cholecalciferol (VITAMIN D) 1000 UNITS  tablet Take 2,000 Units by mouth daily.       . fish oil-omega-3 fatty acids 1000 MG capsule Take 2 g by mouth daily.        Marland Kitchen levothyroxine (SYNTHROID, LEVOTHROID) 200 MCG tablet Take 125 mcg by mouth daily.       Marland Kitchen lisinopril-hydrochlorothiazide (PRINZIDE,ZESTORETIC) 20-12.5 MG per tablet Take 1 tablet by mouth daily.        . pravastatin (PRAVACHOL) 40 MG tablet Take 40 mg by mouth daily.        . citalopram (CELEXA) 20 MG tablet Take 20 mg by mouth daily.        . Linaclotide (LINZESS) 145 MCG CAPS capsule Take 1 capsule (145 mcg total) by mouth daily. 30 minutes before breakfast  30 capsule  11  . Thiamine HCl (VITAMIN B-1 PO) Take 1 tablet by mouth daily.       No current facility-administered  medications for this visit.    Allergies as of 03/04/2013 - Review Complete 03/04/2013  Allergen Reaction Noted  . Sulfonamide derivatives  03/21/2006    Family History  Problem Relation Age of Onset  . Colon cancer Neg Hx     History   Social History  . Marital Status: Divorced    Spouse Name: N/A    Number of Children: N/A  . Years of Education: N/A   Social History Main Topics  . Smoking status: Current Every Day Smoker  . Smokeless tobacco: None     Comment: Smokes a pack of cigarettes about every 2-3 days  . Alcohol Use: No  . Drug Use: No  . Sexual Activity: None   Other Topics Concern  . None   Social History Narrative  . None    Review of Systems: As mentioned in HPI.   Physical Exam: BP 120/66  Pulse 91  Temp(Src) 98.1 F (36.7 C) (Oral)  Ht 5\' 3"  (1.6 m)  Wt 204 lb 3.2 oz (92.625 kg)  BMI 36.18 kg/m2 General:   Alert and oriented. No distress noted. Pleasant and cooperative.  Head:  Normocephalic and atraumatic. Eyes:  Conjuctiva clear without scleral icterus. Mouth:  Oral mucosa pink and moist. Good dentition. No lesions. Neck:  Supple, without mass or thyromegaly. Heart:  S1, S2 present without murmurs, rubs, or gallops. Regular rate and rhythm. Abdomen:  +BS, soft, non-tender and non-distended. No rebound or guarding. Easily reducible small umbilical hernia. Msk:  Symmetrical without gross deformities. Normal posture. Extremities:  Without edema. Neurologic:  Alert and  oriented x4;  grossly normal neurologically. Skin:  Intact without significant lesions or rashes. Cervical Nodes:  No significant cervical adenopathy. Psych:  Alert and cooperative. Normal mood and affect.  CT abd/pelvis Oct 2014:  Thickening of the wall of the distal esophagus, can be seen with  reflux and esophagitis, tumor less likely; correlation with  esophagram or EGD recommended.  Cholelithiasis.  Minimal pericardial effusion.  Small right inguinal and umbilical  hernias containing fat.  Small right adrenal adenoma.

## 2013-03-10 NOTE — Interval H&P Note (Signed)
History and Physical Interval Note:  03/10/2013 11:27 AM  [name]  has presented today for surgery, with the diagnosis of DYSPHAGIA AND CONSTIPATION AND GERD  The various methods of treatment have been discussed with the patient and family. After consideration of risks, benefits and other options for treatment, the patient has consented to  Procedure(s) with comments: ESOPHAGOGASTRODUODENOSCOPY (EGD) (N/A) - 11:30 SAVORY DILATION (N/A) MALONEY DILATION (N/A) as a surgical intervention .  The patient's history has been reviewed, patient examined, no change in status, stable for surgery.  I have reviewed     the patient's chart and labs.  Questions were answered to the patient's satisfaction.     Vonya Ohalloran  No change. EGD with possible esophageal dilation per plan.The risks, benefits, limitations, alternatives and imponderables have been reviewed with the patient. Questions have been answered. All parties are agreeable.

## 2013-03-10 NOTE — Op Note (Signed)
[facility] [address] [place], [address]   ENDOSCOPY PROCEDURE REPORT  PATIENT: [name], [name]  MR#: [id number] BIRTHDATE: [birth year] , 72  yrs. old GENDER: Female ENDOSCOPIST: R.  Roetta Sessions, MD FACP FACG REFERRED BY:  Catalina Pizza, M.D. PROCEDURE DATE:  03/10/2013 PROCEDURE:     EGD with [name] dilation followed by gastric biopsy  INDICATIONS:     Esophageal dysphagia; GERD; thickened distal esophagus on CT scan  INFORMED CONSENT:   The risks, benefits, limitations, alternatives and imponderables have been discussed.  The potential for biopsy, esophogeal dilation, etc. have also been reviewed.  Questions have been answered.  All parties agreeable.  Please see the history and physical in the medical record for more information.  MEDICATIONS:  Versed 3 mg IV and Demerol 75 mg IV in divided doses. Cetacaine spray. Zofran 4 mg IV  DESCRIPTION OF PROCEDURE:   The XB-1478G ([id number])  endoscope was introduced through the mouth and advanced to the second portion of the duodenum without difficulty or limitations.  The mucosal surfaces were surveyed very carefully during advancement of the scope and upon withdrawal.  Retroflexion view of the proximal stomach and esophagogastric junction was performed.      FINDINGS: Prominent Schatzki's ring with superimposed distal esophageal erosions. No Barrett's esophagus. No tumor. Stomach empty except for a single tablet present. Small hiatal hernia. Antral erosions. No ulcer or infiltrating process. Patent pylorus. Normal first and second portion of the duodenum  THERAPEUTIC / DIAGNOSTIC MANEUVERS PERFORMED:  A 54 French Maloney dilator was passed to full insertion easily. A look back revealed the ring remained intact.  Subsequently, Four-quadrant "bites" of the ring with the biopsy forceps taken to additionally disrupt it. This was done effectively and without complication after biopsies of the abnormal antrum  taken.   COMPLICATIONS:  None  IMPRESSION:  Schatzki's ring-dilated/disrupted as described above. Erosive reflux esophagitis. Hiatal hernia. Antral erosions-status post biopsy  RECOMMENDATIONS:  Stop Zantac; Begin Dexilant 60 Mg Daily. Followup on Pathology.    _______________________________ R. Roetta Sessions, MD FACP [facility] eSigned:  R. Roetta Sessions, MD FACP [facility] 03/10/2013 12:03 PM     CC:  PATIENT NAME:  Dream, Harman MR#: [id number]

## 2013-03-13 ENCOUNTER — Encounter: Payer: Self-pay | Admitting: Internal Medicine

## 2013-03-18 ENCOUNTER — Encounter (HOSPITAL_COMMUNITY): Payer: Self-pay | Admitting: Internal Medicine

## 2013-08-13 ENCOUNTER — Ambulatory Visit (HOSPITAL_COMMUNITY): Payer: Medicare Other

## 2013-12-01 ENCOUNTER — Other Ambulatory Visit: Payer: Self-pay | Admitting: Dermatology

## 2014-01-26 ENCOUNTER — Telehealth: Payer: Self-pay | Admitting: Internal Medicine

## 2014-01-26 NOTE — Telephone Encounter (Signed)
Noted  

## 2014-01-26 NOTE — Telephone Encounter (Signed)
I called pt. She is not having any problems, just thought it might be time. Her last colonoscopy was 05/2010 and Dr. Gala Romney recommended her next one in 5 years. She is on Recall for 05/2015 and she will call if she needs anything before then.

## 2014-01-26 NOTE — Telephone Encounter (Signed)
Pt called to set up her colonoscopy. She said that she isn't having any problems she is just due for one. She is on a tight schedule and has certain days she can get this done. She asked if the nurse would call her back within the hour because of her schedule. I told her that I would send the nurse a phone note and that she was already returning other patients calls. [phone]

## 2014-08-18 ENCOUNTER — Other Ambulatory Visit (HOSPITAL_COMMUNITY): Payer: Self-pay | Admitting: Internal Medicine

## 2014-08-18 DIAGNOSIS — M8580 Other specified disorders of bone density and structure, unspecified site: Secondary | ICD-10-CM

## 2014-08-21 ENCOUNTER — Ambulatory Visit (HOSPITAL_COMMUNITY)
Admission: RE | Admit: 2014-08-21 | Discharge: 2014-08-21 | Disposition: A | Discharge status: Home / Self Care | Payer: Medicare HMO | Source: Ambulatory Visit | Attending: Internal Medicine | Admitting: Internal Medicine

## 2014-08-21 DIAGNOSIS — Z853 Personal history of malignant neoplasm of breast: Secondary | ICD-10-CM | POA: Diagnosis not present

## 2014-08-21 DIAGNOSIS — Z87891 Personal history of nicotine dependence: Secondary | ICD-10-CM | POA: Diagnosis not present

## 2014-08-21 DIAGNOSIS — Z780 Asymptomatic menopausal state: Secondary | ICD-10-CM | POA: Insufficient documentation

## 2014-08-21 DIAGNOSIS — R29890 Loss of height: Secondary | ICD-10-CM | POA: Insufficient documentation

## 2014-08-21 DIAGNOSIS — M810 Age-related osteoporosis without current pathological fracture: Secondary | ICD-10-CM | POA: Insufficient documentation

## 2014-08-21 DIAGNOSIS — M8580 Other specified disorders of bone density and structure, unspecified site: Secondary | ICD-10-CM

## 2014-08-25 ENCOUNTER — Other Ambulatory Visit (HOSPITAL_COMMUNITY): Payer: Self-pay | Admitting: Oncology

## 2014-08-25 DIAGNOSIS — C50919 Malignant neoplasm of unspecified site of unspecified female breast: Secondary | ICD-10-CM

## 2014-09-09 ENCOUNTER — Ambulatory Visit (HOSPITAL_COMMUNITY)
Admission: RE | Admit: 2014-09-09 | Discharge: 2014-09-09 | Disposition: A | Discharge status: Home / Self Care | Payer: Medicare HMO | Source: Ambulatory Visit | Attending: Oncology | Admitting: Oncology

## 2014-09-09 DIAGNOSIS — Z9013 Acquired absence of bilateral breasts and nipples: Secondary | ICD-10-CM | POA: Insufficient documentation

## 2014-09-09 DIAGNOSIS — R918 Other nonspecific abnormal finding of lung field: Secondary | ICD-10-CM | POA: Insufficient documentation

## 2014-09-09 DIAGNOSIS — C50919 Malignant neoplasm of unspecified site of unspecified female breast: Secondary | ICD-10-CM | POA: Diagnosis not present

## 2014-09-09 DIAGNOSIS — Z08 Encounter for follow-up examination after completed treatment for malignant neoplasm: Secondary | ICD-10-CM | POA: Diagnosis not present

## 2014-09-09 DIAGNOSIS — R933 Abnormal findings on diagnostic imaging of other parts of digestive tract: Secondary | ICD-10-CM | POA: Diagnosis not present

## 2014-09-24 ENCOUNTER — Other Ambulatory Visit (HOSPITAL_COMMUNITY): Payer: Self-pay | Admitting: Oncology

## 2014-09-24 DIAGNOSIS — C50919 Malignant neoplasm of unspecified site of unspecified female breast: Secondary | ICD-10-CM

## 2014-12-17 ENCOUNTER — Ambulatory Visit (HOSPITAL_COMMUNITY)
Admission: RE | Admit: 2014-12-17 | Discharge: 2014-12-17 | Disposition: A | Discharge status: Home / Self Care | Payer: Medicare HMO | Source: Ambulatory Visit | Attending: Oncology | Admitting: Oncology

## 2014-12-17 DIAGNOSIS — Z853 Personal history of malignant neoplasm of breast: Secondary | ICD-10-CM | POA: Diagnosis not present

## 2014-12-17 DIAGNOSIS — R911 Solitary pulmonary nodule: Secondary | ICD-10-CM | POA: Diagnosis not present

## 2014-12-17 DIAGNOSIS — I313 Pericardial effusion (noninflammatory): Secondary | ICD-10-CM | POA: Insufficient documentation

## 2014-12-17 DIAGNOSIS — K8020 Calculus of gallbladder without cholecystitis without obstruction: Secondary | ICD-10-CM | POA: Insufficient documentation

## 2014-12-17 DIAGNOSIS — R933 Abnormal findings on diagnostic imaging of other parts of digestive tract: Secondary | ICD-10-CM | POA: Diagnosis not present

## 2014-12-17 DIAGNOSIS — C50919 Malignant neoplasm of unspecified site of unspecified female breast: Secondary | ICD-10-CM

## 2015-01-04 ENCOUNTER — Other Ambulatory Visit (HOSPITAL_COMMUNITY): Payer: Self-pay | Admitting: Oncology

## 2015-01-04 DIAGNOSIS — Z87891 Personal history of nicotine dependence: Secondary | ICD-10-CM

## 2015-01-04 DIAGNOSIS — C4490 Unspecified malignant neoplasm of skin, unspecified: Secondary | ICD-10-CM

## 2015-01-04 DIAGNOSIS — C50912 Malignant neoplasm of unspecified site of left female breast: Secondary | ICD-10-CM

## 2015-01-04 DIAGNOSIS — C50911 Malignant neoplasm of unspecified site of right female breast: Secondary | ICD-10-CM

## 2015-01-30 DIAGNOSIS — E1165 Type 2 diabetes mellitus with hyperglycemia: Secondary | ICD-10-CM | POA: Diagnosis not present

## 2015-01-30 DIAGNOSIS — M810 Age-related osteoporosis without current pathological fracture: Secondary | ICD-10-CM | POA: Diagnosis not present

## 2015-01-30 DIAGNOSIS — E782 Mixed hyperlipidemia: Secondary | ICD-10-CM | POA: Diagnosis not present

## 2015-01-30 DIAGNOSIS — C50919 Malignant neoplasm of unspecified site of unspecified female breast: Secondary | ICD-10-CM | POA: Diagnosis not present

## 2015-01-30 DIAGNOSIS — E039 Hypothyroidism, unspecified: Secondary | ICD-10-CM | POA: Diagnosis not present

## 2015-05-06 ENCOUNTER — Ambulatory Visit (HOSPITAL_COMMUNITY): Payer: Medicare HMO

## 2015-05-11 ENCOUNTER — Ambulatory Visit (HOSPITAL_COMMUNITY): Payer: Medicare HMO

## 2015-05-13 ENCOUNTER — Ambulatory Visit (HOSPITAL_COMMUNITY): Admission: RE | Admit: 2015-05-13 | Payer: Medicare HMO | Source: Ambulatory Visit

## 2015-05-18 ENCOUNTER — Encounter: Payer: Self-pay | Admitting: Internal Medicine

## 2015-05-20 ENCOUNTER — Ambulatory Visit (HOSPITAL_COMMUNITY): Payer: Medicare HMO

## 2015-05-31 ENCOUNTER — Ambulatory Visit (HOSPITAL_COMMUNITY)
Admission: RE | Admit: 2015-05-31 | Discharge: 2015-05-31 | Disposition: A | Discharge status: Home / Self Care | Payer: Medicare HMO | Source: Ambulatory Visit | Attending: Oncology | Admitting: Oncology

## 2015-05-31 DIAGNOSIS — I728 Aneurysm of other specified arteries: Secondary | ICD-10-CM | POA: Insufficient documentation

## 2015-05-31 DIAGNOSIS — R911 Solitary pulmonary nodule: Secondary | ICD-10-CM | POA: Insufficient documentation

## 2015-05-31 DIAGNOSIS — C4490 Unspecified malignant neoplasm of skin, unspecified: Secondary | ICD-10-CM

## 2015-05-31 DIAGNOSIS — Z853 Personal history of malignant neoplasm of breast: Secondary | ICD-10-CM | POA: Diagnosis not present

## 2015-05-31 DIAGNOSIS — Z87891 Personal history of nicotine dependence: Secondary | ICD-10-CM

## 2015-05-31 DIAGNOSIS — I313 Pericardial effusion (noninflammatory): Secondary | ICD-10-CM | POA: Insufficient documentation

## 2015-05-31 DIAGNOSIS — C50912 Malignant neoplasm of unspecified site of left female breast: Secondary | ICD-10-CM

## 2015-05-31 DIAGNOSIS — C50911 Malignant neoplasm of unspecified site of right female breast: Secondary | ICD-10-CM

## 2015-06-09 ENCOUNTER — Other Ambulatory Visit (HOSPITAL_COMMUNITY): Payer: Self-pay | Admitting: Oncology

## 2015-06-09 DIAGNOSIS — C50912 Malignant neoplasm of unspecified site of left female breast: Secondary | ICD-10-CM

## 2015-06-09 DIAGNOSIS — R918 Other nonspecific abnormal finding of lung field: Secondary | ICD-10-CM

## 2015-06-09 DIAGNOSIS — C50911 Malignant neoplasm of unspecified site of right female breast: Secondary | ICD-10-CM

## 2015-07-13 DIAGNOSIS — E039 Hypothyroidism, unspecified: Secondary | ICD-10-CM | POA: Diagnosis not present

## 2015-07-13 DIAGNOSIS — E119 Type 2 diabetes mellitus without complications: Secondary | ICD-10-CM | POA: Diagnosis not present

## 2015-07-13 DIAGNOSIS — I10 Essential (primary) hypertension: Secondary | ICD-10-CM | POA: Diagnosis not present

## 2015-07-13 DIAGNOSIS — E782 Mixed hyperlipidemia: Secondary | ICD-10-CM | POA: Diagnosis not present

## 2015-07-15 DIAGNOSIS — M816 Localized osteoporosis [Lequesne]: Secondary | ICD-10-CM | POA: Diagnosis not present

## 2015-07-15 DIAGNOSIS — R7301 Impaired fasting glucose: Secondary | ICD-10-CM | POA: Diagnosis not present

## 2015-07-15 DIAGNOSIS — E039 Hypothyroidism, unspecified: Secondary | ICD-10-CM | POA: Diagnosis not present

## 2015-07-15 DIAGNOSIS — E782 Mixed hyperlipidemia: Secondary | ICD-10-CM | POA: Diagnosis not present

## 2015-07-15 DIAGNOSIS — M25569 Pain in unspecified knee: Secondary | ICD-10-CM | POA: Diagnosis not present

## 2015-07-15 DIAGNOSIS — C50919 Malignant neoplasm of unspecified site of unspecified female breast: Secondary | ICD-10-CM | POA: Diagnosis not present

## 2015-07-22 DIAGNOSIS — R69 Illness, unspecified: Secondary | ICD-10-CM | POA: Diagnosis not present

## 2015-08-25 DIAGNOSIS — Z87891 Personal history of nicotine dependence: Secondary | ICD-10-CM | POA: Diagnosis not present

## 2015-08-25 DIAGNOSIS — C50912 Malignant neoplasm of unspecified site of left female breast: Secondary | ICD-10-CM | POA: Diagnosis not present

## 2015-08-25 DIAGNOSIS — J439 Emphysema, unspecified: Secondary | ICD-10-CM | POA: Diagnosis not present

## 2015-08-25 DIAGNOSIS — C50911 Malignant neoplasm of unspecified site of right female breast: Secondary | ICD-10-CM | POA: Diagnosis not present

## 2015-08-25 DIAGNOSIS — R918 Other nonspecific abnormal finding of lung field: Secondary | ICD-10-CM | POA: Diagnosis not present

## 2015-12-24 DIAGNOSIS — Z0100 Encounter for examination of eyes and vision without abnormal findings: Secondary | ICD-10-CM | POA: Diagnosis not present

## 2016-01-11 DIAGNOSIS — E7800 Pure hypercholesterolemia, unspecified: Secondary | ICD-10-CM | POA: Diagnosis not present

## 2016-01-11 DIAGNOSIS — Z0000 Encounter for general adult medical examination without abnormal findings: Secondary | ICD-10-CM | POA: Diagnosis not present

## 2016-01-11 DIAGNOSIS — I10 Essential (primary) hypertension: Secondary | ICD-10-CM | POA: Diagnosis not present

## 2016-01-11 DIAGNOSIS — E039 Hypothyroidism, unspecified: Secondary | ICD-10-CM | POA: Diagnosis not present

## 2016-01-18 DIAGNOSIS — E039 Hypothyroidism, unspecified: Secondary | ICD-10-CM | POA: Diagnosis not present

## 2016-01-18 DIAGNOSIS — E1165 Type 2 diabetes mellitus with hyperglycemia: Secondary | ICD-10-CM | POA: Diagnosis not present

## 2016-01-20 DIAGNOSIS — M25569 Pain in unspecified knee: Secondary | ICD-10-CM | POA: Diagnosis not present

## 2016-01-20 DIAGNOSIS — Z0000 Encounter for general adult medical examination without abnormal findings: Secondary | ICD-10-CM | POA: Diagnosis not present

## 2016-01-20 DIAGNOSIS — Z23 Encounter for immunization: Secondary | ICD-10-CM | POA: Diagnosis not present

## 2016-01-20 DIAGNOSIS — R911 Solitary pulmonary nodule: Secondary | ICD-10-CM | POA: Diagnosis not present

## 2016-01-20 DIAGNOSIS — E782 Mixed hyperlipidemia: Secondary | ICD-10-CM | POA: Diagnosis not present

## 2016-01-20 DIAGNOSIS — Z8711 Personal history of peptic ulcer disease: Secondary | ICD-10-CM | POA: Diagnosis not present

## 2016-01-20 DIAGNOSIS — K5900 Constipation, unspecified: Secondary | ICD-10-CM | POA: Diagnosis not present

## 2016-01-20 DIAGNOSIS — R7301 Impaired fasting glucose: Secondary | ICD-10-CM | POA: Diagnosis not present

## 2016-01-20 DIAGNOSIS — C50919 Malignant neoplasm of unspecified site of unspecified female breast: Secondary | ICD-10-CM | POA: Diagnosis not present

## 2016-02-17 DIAGNOSIS — E039 Hypothyroidism, unspecified: Secondary | ICD-10-CM | POA: Diagnosis not present

## 2016-06-08 ENCOUNTER — Ambulatory Visit (HOSPITAL_COMMUNITY): Payer: Medicare HMO

## 2016-06-26 DIAGNOSIS — Z0000 Encounter for general adult medical examination without abnormal findings: Secondary | ICD-10-CM | POA: Diagnosis not present

## 2016-06-26 DIAGNOSIS — I10 Essential (primary) hypertension: Secondary | ICD-10-CM | POA: Diagnosis not present

## 2016-06-26 DIAGNOSIS — R69 Illness, unspecified: Secondary | ICD-10-CM | POA: Diagnosis not present

## 2016-06-26 DIAGNOSIS — Z6833 Body mass index (BMI) 33.0-33.9, adult: Secondary | ICD-10-CM | POA: Diagnosis not present

## 2016-06-26 DIAGNOSIS — Z7982 Long term (current) use of aspirin: Secondary | ICD-10-CM | POA: Diagnosis not present

## 2016-06-26 DIAGNOSIS — E669 Obesity, unspecified: Secondary | ICD-10-CM | POA: Diagnosis not present

## 2016-06-26 DIAGNOSIS — Z853 Personal history of malignant neoplasm of breast: Secondary | ICD-10-CM | POA: Diagnosis not present

## 2016-06-26 DIAGNOSIS — E039 Hypothyroidism, unspecified: Secondary | ICD-10-CM | POA: Diagnosis not present

## 2016-06-26 DIAGNOSIS — E785 Hyperlipidemia, unspecified: Secondary | ICD-10-CM | POA: Diagnosis not present

## 2016-08-02 DIAGNOSIS — M25569 Pain in unspecified knee: Secondary | ICD-10-CM | POA: Diagnosis not present

## 2016-08-02 DIAGNOSIS — Z6836 Body mass index (BMI) 36.0-36.9, adult: Secondary | ICD-10-CM | POA: Diagnosis not present

## 2016-08-02 DIAGNOSIS — Z7409 Other reduced mobility: Secondary | ICD-10-CM | POA: Diagnosis not present

## 2016-08-02 DIAGNOSIS — M25559 Pain in unspecified hip: Secondary | ICD-10-CM | POA: Diagnosis not present

## 2016-08-03 DIAGNOSIS — R69 Illness, unspecified: Secondary | ICD-10-CM | POA: Diagnosis not present

## 2016-08-10 DIAGNOSIS — E1165 Type 2 diabetes mellitus with hyperglycemia: Secondary | ICD-10-CM | POA: Diagnosis not present

## 2016-08-10 DIAGNOSIS — E782 Mixed hyperlipidemia: Secondary | ICD-10-CM | POA: Diagnosis not present

## 2016-08-10 DIAGNOSIS — E039 Hypothyroidism, unspecified: Secondary | ICD-10-CM | POA: Diagnosis not present

## 2016-08-22 DIAGNOSIS — E1165 Type 2 diabetes mellitus with hyperglycemia: Secondary | ICD-10-CM | POA: Diagnosis not present

## 2016-08-22 DIAGNOSIS — E782 Mixed hyperlipidemia: Secondary | ICD-10-CM | POA: Diagnosis not present

## 2016-08-22 DIAGNOSIS — E039 Hypothyroidism, unspecified: Secondary | ICD-10-CM | POA: Diagnosis not present

## 2016-09-04 ENCOUNTER — Other Ambulatory Visit (HOSPITAL_COMMUNITY): Payer: Self-pay | Admitting: Internal Medicine

## 2016-09-04 DIAGNOSIS — R918 Other nonspecific abnormal finding of lung field: Secondary | ICD-10-CM

## 2016-09-13 ENCOUNTER — Encounter (HOSPITAL_COMMUNITY): Payer: Self-pay | Admitting: Occupational Therapy

## 2016-09-13 ENCOUNTER — Ambulatory Visit (HOSPITAL_COMMUNITY): Payer: Medicare HMO | Attending: Internal Medicine | Admitting: Occupational Therapy

## 2016-09-13 DIAGNOSIS — M25561 Pain in right knee: Secondary | ICD-10-CM | POA: Diagnosis present

## 2016-09-13 DIAGNOSIS — M25562 Pain in left knee: Secondary | ICD-10-CM | POA: Diagnosis present

## 2016-09-13 DIAGNOSIS — G8929 Other chronic pain: Secondary | ICD-10-CM | POA: Insufficient documentation

## 2016-09-13 NOTE — Therapy (Signed)
[place], [place], [address] Phone: [phone]   Fax:  [phone]  Occupational Therapy Wheelchair Evaluation  Patient Details  Name: [name] MRN: [id number] Date of Birth: 02/28/1941 No Data Recorded  Encounter Date: 09/13/2016      OT End of Session - 09/13/16 1233    Visit Number 1   Number of Visits 1   Date for OT Re-Evaluation 09/14/16   Authorization Type [employer]   Authorization Time Period Before 10th visit   Authorization - Visit Number 1   Authorization - Number of Visits 10   OT Start Time 1036   OT Stop Time 1115   OT Time Calculation (min) 39 min   Activity Tolerance Patient tolerated treatment well   Behavior During Therapy [facility] for tasks assessed/performed      Past Medical History:  Diagnosis Date  . Abnormal heart rhythms   . Allergic rhinitis   . Anxiety   . Arthritis   . Bronchitis   . Chest wall pain    ANTERIOR  . Depression   . Dysuria   . Eustachian tube dysfunction   . Gallstones   . Gallstones   . GERD (gastroesophageal reflux disease)   . Hyperlipidemia   . Hypertension   . Hypothyroidism   . Hypoxemia   . Incontinence   . Liver function study, abnormal   . Malaise and fatigue   . Microscopic hematuria   . Osteopenia   . Personal history of colonic polyps   . Primary breast malignancy ([place])   . Snoring   . Tobacco abuse   . Ulcer     Past Surgical History:  Procedure Laterality Date  . BILATERAL MASTECTOMY    . COLONOSCOPY  4/07   FTD:DUKGURK internal hemorrhoids otherwise normal  . COLONOSCOPY  05/25/2010   YHC:WCBJSEGB hemorrhoids, otherwise normal rectum and colon.(POOR PREP). Due for surveillance 2017.   Marland Kitchen COLONOSCOPY  04/22/2002     RMR: Internal hemorrhoids, otherwise normal rectum  Polyps in the left colon resected with snare  . ESOPHAGOGASTRODUODENOSCOPY  05/25/2010   RMR:EGD distal esophageal erosions consistent with mild erosive  reflux esophagitis, otherwise unremarkable esophagus status post passage of a 56-French Maloney dilator/Small hiatal hernia/Gastric ulcer with negative H.pylori. Overdue for surveillance.   . ESOPHAGOGASTRODUODENOSCOPY N/A 03/10/2013   Procedure: ESOPHAGOGASTRODUODENOSCOPY (EGD);  Surgeon: Daneil Dolin, MD;  [facility];  Service: Endoscopy;  Laterality: N/A;  11:30  . LEFT LEG SURGERY     MULTIPLE DUE TO FX  . LEFT RTC TEAR    . MALONEY DILATION N/A 03/10/2013   Procedure: Venia Minks DILATION;  Surgeon: Daneil Dolin, MD;  Location: [facility];  Service: Endoscopy;  Laterality: N/A;  . SAVORY DILATION N/A 03/10/2013   Procedure: SAVORY DILATION;  Surgeon: Daneil Dolin, MD;  Location: [facility];  Service: Endoscopy;  Laterality: N/A;  . TONSILECTOMY, ADENOIDECTOMY, BILATERAL MYRINGOTOMY AND TUBES    . TONSILLECTOMY    . VESICOVAGINAL FISTULA CLOSURE W/ TAH     excessive bleeding-benign    There were no vitals filed for this visit.      Subjective Assessment - 09/13/16 1228    Subjective  S: I just can't walk much anymore.    Currently in Pain? Yes   Pain Score 5    Pain Location Knee   Pain Orientation Right;Left   Pain Descriptors / Indicators Constant;Aching   Pain Type Chronic pain  Pain Radiating Towards hips to knees   Pain Onset More than a month ago   Pain Frequency Constant   Aggravating Factors  movement, weightbearing   Pain Relieving Factors rest, sitting   Effect of Pain on Daily Activities mod effect on ADL completion   Multiple Pain Sites No        Date: 09/13/2016 Patient Name: [name] Address: [address] [place], [place] [address] DOB: [birth year]  To whom it may concern,   [name] was seen today in this clinic for an evaluation for an electric scooter. [name] has a past medical history including multiple knee fractures requiring surgery, which resulted in bone infections and multiple skin and muscle grafting of the LLE;  history of breast cancer and double mastectomy. [name] reports she has pain in the left knee due to deteriorating cartilage, however her orthopaedic doctor will not operate unless absolutely necessary due to high risk of complications. [name] currently uses a cane for all functional mobility in and around her home, including transfers, as well as out in the community.   [name] lives alone in a small senior living apartment, which is approximately 500-600 square feet in size. She is independent in B/ADL performance and functional mobility in the home. She has one son who assists with errands occasionally, difficult due to working 3rd shift. Pt does require assistance for housekeeping due to pain and limited activity tolerance. Pt reports she has been unable to squat down for years and is now having difficulty with bending over. [name] primary concern is being able to complete necessary errands, such as grocery shopping, as she has limited assistance from family.   A FULL PHYSICAL ASSESSMENT REVEALS THE FOLLOWING  Existing Equipment: [name] lives in senior housing which is handicap accessible with a level entrance, uses a cane, and has grab bars in the shower (walk-in).     Transfers: Modified independent with cane   Head and Neck: A/ROM is WNL     Trunk: WNL   Pelvis: WFL     Hip: WFL bilaterally; slight limitations in ROM due to arthritis   Knees: RLE WFL, LLE A/ROM is approximately 50-75% due to arthritis and pain   limiting mobility   Feet and Ankles: RLE A/ROM is WNL; LLE-pt has foot drop therefore dorsiflexion is   poor and increases fall risk    Upper Extremities: A/ROM is WFL; strength is 4+/5 throughout   Lower Extremities: RLE MMT 4/5; LLE MMT 3+/5   Weight Shifting Ability: Independent in weight shifting    Skin Integrity: No difficulty with pressure sores or skin irritation     Cognition: WNL   Activity Tolerance: Fair. [name] is ambulatory with a single point  cane for distances of 100-200 feet.  When in the community [name] uses Emergency planning/management officer provided by stores, if unavailable she is not able to go into stores. [name] reports she can stand for approximately 10 minutes before requiring a seated rest break, however is in constant pain when standing both statically and with mobility.   GOALS/OBJECTIVE OF SEATING INTERVENTION:    Function: [name] is currently independent in ADL tasks, functional mobility in the home, and limited I/ADL tasks in the home. [name] primary concern is maintaining her independence during community mobility including grocery shopping and doctor appointments. Discussed with [name] her current community mobility strategies including use of store provided scooters, renting a scooter, or looking into second  hand scooters. [name] would benefit from an electric scooter for community mobility to maintain independence during I/ADL completion and decrease caregiver burden. However, at this time [name] may not qualify for an electric scooter as she is independent in ADL tasks and short distances with community mobility using a cane.     If you require any further information concerning [name]'s positioning, independence or mobility needs; or any further information why a lesser device will not work, please do not hesitate to contact me at [place], [address] [place], [place] [address] [phone].                               Patient will benefit from skilled therapeutic intervention in order to improve the following deficits and impairments:  Pain, Difficulty walking, Decreased activity tolerance  Visit Diagnosis: Chronic pain of right knee  Chronic pain of left knee      G-Codes - 09-30-2016 1234    Functional Assessment Tool Used (Outpatient only) clinical judgement   Functional Limitation Self care   Self Care Goal Status ([id number]) At least  20 percent but less than 40 percent impaired, limited or restricted   Self Care Discharge Status [phone]) At least 20 percent but less than 40 percent impaired, limited or restricted      Problem List Patient Active Problem List   Diagnosis Date Noted  . CONSTIPATION 05/11/2010  . Other dysphagia 05/11/2010  . ABDOMINAL PAIN, UNSPECIFIED SITE 05/11/2010  . TOBACCO ABUSE 11/04/2008  . COUGH 09/04/2008  . ANXIETY DEPRESSION 08/07/2008  . OTITIS MEDIA, CHRONIC 05/07/2008  . DEGENERATIVE JOINT DISEASE 02/18/2008  . GALLSTONES 09/23/2007  . HYPOXEMIA 04/30/2007  . MICROSCOPIC HEMATURIA 04/16/2007  . INCONTINENCE 04/16/2007  . COLONIC POLYPS 03/21/2006  . HYPOTHYROIDISM 03/21/2006  . HYPERLIPIDEMIA 03/21/2006  . CATARACT NOS 03/21/2006  . HYPERTENSION 03/21/2006  . ALLERGIC RHINITIS 03/21/2006  . GERD 03/21/2006  . ARTHRITIS 03/21/2006  . OSTEOPENIA 03/21/2006  . HX, PERSONAL, MALIGNANCY, BREAST 03/21/2006   Guadelupe Sabin, OTR/L  [phone] 09/30/16, 12:35 PM  [facility] [address] [place], [place], [address] Phone: [phone]   Fax:  [phone]  Name: HONOR FRISON MRN: [id number] Date of Birth: 1941/02/23

## 2016-09-21 ENCOUNTER — Ambulatory Visit (HOSPITAL_COMMUNITY): Payer: Medicare HMO

## 2016-10-16 ENCOUNTER — Ambulatory Visit (HOSPITAL_COMMUNITY): Payer: Medicare HMO

## 2016-10-16 DIAGNOSIS — R69 Illness, unspecified: Secondary | ICD-10-CM | POA: Diagnosis not present

## 2016-10-24 ENCOUNTER — Ambulatory Visit (HOSPITAL_COMMUNITY): Payer: Medicare HMO

## 2016-10-27 ENCOUNTER — Ambulatory Visit (HOSPITAL_COMMUNITY)
Admission: RE | Admit: 2016-10-27 | Discharge: 2016-10-27 | Disposition: A | Discharge status: Home / Self Care | Payer: Medicare HMO | Source: Ambulatory Visit | Attending: Internal Medicine | Admitting: Internal Medicine

## 2016-10-27 DIAGNOSIS — K449 Diaphragmatic hernia without obstruction or gangrene: Secondary | ICD-10-CM | POA: Insufficient documentation

## 2016-10-27 DIAGNOSIS — I700 Atherosclerosis of aorta: Secondary | ICD-10-CM | POA: Diagnosis not present

## 2016-10-27 DIAGNOSIS — I313 Pericardial effusion (noninflammatory): Secondary | ICD-10-CM | POA: Insufficient documentation

## 2016-10-27 DIAGNOSIS — R918 Other nonspecific abnormal finding of lung field: Secondary | ICD-10-CM | POA: Insufficient documentation

## 2016-10-27 DIAGNOSIS — I2510 Atherosclerotic heart disease of native coronary artery without angina pectoris: Secondary | ICD-10-CM | POA: Diagnosis not present

## 2016-10-31 ENCOUNTER — Encounter (HOSPITAL_COMMUNITY): Payer: Self-pay

## 2016-10-31 ENCOUNTER — Encounter (HOSPITAL_COMMUNITY): Payer: Medicare HMO | Attending: Oncology | Admitting: Oncology

## 2016-10-31 DIAGNOSIS — C50919 Malignant neoplasm of unspecified site of unspecified female breast: Secondary | ICD-10-CM

## 2016-10-31 DIAGNOSIS — R222 Localized swelling, mass and lump, trunk: Secondary | ICD-10-CM | POA: Diagnosis not present

## 2016-10-31 DIAGNOSIS — Z853 Personal history of malignant neoplasm of breast: Secondary | ICD-10-CM

## 2016-10-31 NOTE — Progress Notes (Signed)
[name]:  Patient Care Team: Celene Squibb, MD as PCP - General (Internal Medicine)  CHIEF COMPLAINTS/PURPOSE OF CONSULTATION:  Bilateral breast cancer  HISTORY OF PRESENTING ILLNESS: [name] 76 y.o. female is here because of bilateral breast cancers. She previously used to follow back here in 2013 but then left when her oncologist Dr. Tressie Stalker left. She has been following with him once a year since then. She is status post mastectomy on 02/16/1998 for a left sided invasive lobular carcinoma stage I. Status post tamoxifen for 5 years for this cancer ending in November 2004. Status post resection of a right-sided invasive lobular carcinoma stage II on 05/19/2005 with mastectomy and status post Aromasin, but then switched to Arimidex due to cost, finishing as of February 2012. Patient has not had a recurrence since then.   Today she complains of a growth on her right upper back near her shoulder which her PCP had lanced and she states now it is bigger than before. She has problems with constipation as well as chronic fatigue. Otherwise she is doing well.  Review of Systems  Constitutional: Positive for fatigue. Negative for appetite change, chills and fever.  HENT:   Negative for hearing loss, lump/mass, mouth sores, sore throat and tinnitus.   Eyes: Negative for eye problems and icterus.  Respiratory: Negative for chest tightness, cough, hemoptysis, shortness of breath and wheezing.   Cardiovascular: Negative for chest pain, leg swelling and palpitations.  Gastrointestinal: Positive for constipation. Negative for abdominal distention, abdominal pain, blood in stool, diarrhea, nausea and vomiting.  Endocrine: Negative.  Negative for hot flashes.  Genitourinary: Negative for difficulty urinating, frequency and hematuria.   Musculoskeletal: Negative for arthralgias and neck pain.  Skin: Negative for itching and rash.       As per HPI  Neurological:  Negative for dizziness, headaches and speech difficulty.  Hematological: Negative for adenopathy. Does not bruise/bleed easily.  Psychiatric/Behavioral: Negative for confusion. The patient is not nervous/anxious.     MEDICAL HISTORY: Past Medical History:  Diagnosis Date  . Abnormal heart rhythms   . Allergic rhinitis   . Anxiety   . Arthritis   . Bronchitis   . Chest wall pain    ANTERIOR  . Depression   . Dysuria   . Eustachian tube dysfunction   . Gallstones   . Gallstones   . GERD (gastroesophageal reflux disease)   . Hyperlipidemia   . Hypertension   . Hypothyroidism   . Hypoxemia   . Incontinence   . Liver function study, abnormal   . Malaise and fatigue   . Microscopic hematuria   . Osteopenia   . Personal history of colonic polyps   . Primary breast malignancy ([place])   . Snoring   . Tobacco abuse   . Ulcer     SURGICAL HISTORY: Past Surgical History:  Procedure Laterality Date  . BILATERAL MASTECTOMY    . COLONOSCOPY  4/07   YDX:AJOINOM internal hemorrhoids otherwise normal  . COLONOSCOPY  05/25/2010   VEH:MCNOBSJG hemorrhoids, otherwise normal rectum and colon.(POOR PREP). Due for surveillance 2017.   Marland Kitchen COLONOSCOPY  04/22/2002     RMR: Internal hemorrhoids, otherwise normal rectum  Polyps in the left colon resected with snare  . ESOPHAGOGASTRODUODENOSCOPY  05/25/2010   RMR:EGD distal esophageal erosions consistent with mild erosive reflux esophagitis, otherwise unremarkable esophagus status post passage of a 56-French Maloney dilator/Small hiatal hernia/Gastric ulcer with negative H.pylori. Overdue for surveillance.   Marland Kitchen  ESOPHAGOGASTRODUODENOSCOPY N/A 03/10/2013   Procedure: ESOPHAGOGASTRODUODENOSCOPY (EGD);  Surgeon: Daneil Dolin, MD;  [facility];  Service: Endoscopy;  Laterality: N/A;  11:30  . LEFT LEG SURGERY     MULTIPLE DUE TO FX  . LEFT RTC TEAR    . MALONEY DILATION N/A 03/10/2013   Procedure: Venia Minks DILATION;  Surgeon: Daneil Dolin, MD;  [facility];  Service: Endoscopy;  Laterality: N/A;  . SAVORY DILATION N/A 03/10/2013   Procedure: SAVORY DILATION;  Surgeon: Daneil Dolin, MD;  [facility];  Service: Endoscopy;  Laterality: N/A;  . TONSILECTOMY, ADENOIDECTOMY, BILATERAL MYRINGOTOMY AND TUBES    . TONSILLECTOMY    . VESICOVAGINAL FISTULA CLOSURE W/ TAH     excessive bleeding-benign    SOCIAL HISTORY: Social History   Social History  . Marital status: Single    Spouse name: N/A  . Number of children: N/A  . Years of education: N/A   Occupational History  . Not on file.   Social History Main Topics  . Smoking status: Current Every Day Smoker    Packs/day: 0.50    Years: 25.00    Types: Cigarettes  . Smokeless tobacco: Never Used     Comment: Smokes a pack of cigarettes about every 2-3 days  . Alcohol use No  . Drug use: No  . Sexual activity: Not Currently   Other Topics Concern  . Not on file   Social History Narrative  . No narrative on file    FAMILY HISTORY Family History  Problem Relation Age of Onset  . Colon cancer Neg Hx     ALLERGIES:  is allergic to sulfonamide derivatives.  MEDICATIONS:  Current Outpatient Prescriptions  Medication Sig Dispense Refill  . amLODipine (NORVASC) 10 MG tablet Take 10 mg by mouth daily.      Marland Kitchen aspirin EC 81 MG tablet Take 81 mg by mouth daily.    Marland Kitchen atorvastatin (LIPITOR) 40 MG tablet Take 40 mg by mouth daily.    . Calcium Carbonate-Vit D-Min (CALTRATE 600+D PLUS) 600-400 MG-UNIT per tablet Take 1 tablet by mouth 2 (two) times daily.      Marland Kitchen levothyroxine (SYNTHROID, LEVOTHROID) 150 MCG tablet Take 150 mcg by mouth daily before breakfast.    . lisinopril-hydrochlorothiazide (PRINZIDE,ZESTORETIC) 20-12.5 MG per tablet Take 1 tablet by mouth daily.       No current facility-administered medications for this visit.     PHYSICAL EXAMINATION:  ECOG PERFORMANCE STATUS: 0 - Asymptomatic   Vitals:   10/31/16 0800  BP:  140/72  Pulse: 85  Resp: 18  Temp: 98.1 F (36.7 C)    Filed Weights   10/31/16 0800  Weight: 200 lb (90.7 kg)     Physical Exam  Constitutional: She is oriented to person, place, and time and well-developed, well-nourished, and in no distress. No distress.  HENT:  Head: Normocephalic and atraumatic.  Mouth/Throat: No oropharyngeal exudate.  Eyes: Conjunctivae are normal. Pupils are equal, round, and reactive to light. No scleral icterus.  Neck: Normal range of motion. Neck supple. No JVD present.  Cardiovascular: Normal rate, regular rhythm and normal heart sounds.  Exam reveals no gallop and no friction rub.   No murmur heard. Pulmonary/Chest: Breath sounds normal. No respiratory distress. She has no wheezes. She has no rales.  CHEST WALL: bilateral mastectomies, no subcutaneous nodules palpated, no skin changes. No axillary lymphadenopathy bilaterally.  Abdominal: Soft. Bowel sounds are normal. She exhibits no distension.  There is no tenderness. There is no guarding.  Musculoskeletal: She exhibits no edema or tenderness.  Lymphadenopathy:    She has no cervical adenopathy.  Neurological: She is alert and oriented to person, place, and time. No cranial nerve deficit.  Skin: Skin is warm and dry. No rash noted. No erythema. No pallor.  1 cm nodular lesion on right upper back without any erythema or discharge.   Psychiatric: Affect and judgment normal.     LABORATORY DATA: I have personally reviewed the data as listed:  No visits with results within 1 Month(s) from this visit.  Latest known visit with results is:  Abstract on 03/06/2013  Component Date Value Ref Range Status  . HGB 02/28/2013 13.7  g/dL Final  . HCT 02/28/2013 40  % Final  . Platelets 02/28/2013 286  K/L Final  . Total Bilirubin 02/28/2013 0.4  mg/dL Final  . AST 02/28/2013 27  U/L Final  . ALT 02/28/2013 42* 7 - 35 U/L Final  . TSH 02/28/2013 3.23  0.41 - 5.90 uIU/mL Final    RADIOGRAPHIC  STUDIES: I have personally reviewed the radiological images as listed and agree with the findings in the report  Ct Chest Wo Contrast  Result Date: 10/27/2016 CLINICAL DATA:  Follow-up pulmonary nodules. EXAM: CT CHEST WITHOUT CONTRAST TECHNIQUE: Multidetector CT imaging of the chest was performed following the standard protocol without IV contrast. COMPARISON:  05/31/2015 chest CT. FINDINGS: Cardiovascular: Normal heart size. Small pericardial effusion/thickening, stable. Left anterior descending and right coronary atherosclerosis. Atherosclerotic nonaneurysmal thoracic aorta. Normal caliber pulmonary arteries. Mediastinum/Nodes: Thyroid is either atrophic or surgically absent. No discrete thyroid nodules. Unremarkable esophagus. No pathologically enlarged axillary, mediastinal or gross hilar lymph nodes, noting limited sensitivity for the detection of hilar adenopathy on this noncontrast study. Surgical clips are again noted in the right axilla. Lungs/Pleura: No pneumothorax. No pleural effusion. Ground-glass posterior right upper lobe 2.6 x 1.8 cm pulmonary nodule (series 4/ image 42), previously 2.6 x 1.8 cm on 05/31/2015, 12/17/2014 and 09/09/2014 using similar measurement technique, stable . Posterior left upper lobe 8 mm solid pulmonary nodule (series 4/ image 60), unchanged back to the 09/09/2014. Scattered centrilobular micro nodularity in the upper lungs is unchanged back to 09/09/2014, compatible with benign postinflammatory nodularity. No acute consolidative airspace disease, lung masses or new significant pulmonary nodules. Stable parenchymal banding at the peripheral left lung base, compatible with postinfectious/ postinflammatory scarring. Upper abdomen: Small hiatal hernia. Musculoskeletal: No aggressive appearing focal osseous lesions. Soft tissue anchors are again noted in the left humeral head. Moderate thoracic spondylosis. Status post bilateral mastectomy. IMPRESSION: 1. Ground-glass 2.6 cm  posterior right upper lobe pulmonary nodule, stable back to 09/09/2014. Repeat CT is recommended every 2 years until 5 years of stability has been established. This recommendation follows the consensus statement: Guidelines for Management of Incidental Pulmonary Nodules Detected on CT Images: From the Fleischner Society 2017; Radiology 2017; 284:228-243. 2. Posterior left upper lobe 8 mm solid pulmonary nodule, stable back to 09/09/2014, considered benign. 3. No acute cardiopulmonary disease. 4. Additional findings include 2 vessel coronary atherosclerosis, stable small pericardial effusion/thickening and small hiatal hernia. Aortic Atherosclerosis (ICD10-I70.0). Electronically Signed   By: Ilona Sorrel M.D.   On: 10/27/2016 13:32    ASSESSMENT: 1.Status post mastectomy on 02/16/1998 for a left sided invasive lobular carcinoma stage I. Status post tamoxifen for 5 years for this cancer ending in November 2004. Status post resection of a right-sided invasive lobular carcinoma stage II on 05/19/2005 with  mastectomy and status post Aromasin, but then switched to Arimidex due to cost, finishing as of February 2012. 2. Nodular lesion on right upper back. 3. RUL pulm nodule-stable. H/o smoking. Quit smoking June 2018.  PLAN: Clinically NED on exam of today.  Recommended patient to go back to her dermatologist for the nodular growth on her right upper back. Repeat CT chest in 2 years to monitor the ground glass 2.6 cm RUL pulm nodule, which has been stable so far.  RTC in 1 year for follow up.  All questions were answered. The patient knows to call the clinic with any problems, questions or concerns.  This note was electronically signed.    Twana First, MD  10/31/2016 8:56 AM

## 2016-10-31 NOTE — Patient Instructions (Signed)
[name] at [facility] Discharge Instructions  RECOMMENDATIONS MADE BY THE CONSULTANT AND ANY TEST RESULTS WILL BE SENT TO YOUR REFERRING PHYSICIAN.  Exam with Dr. Talbert Cage today.    Thank you for choosing [place] at [facility] to provide your oncology and hematology care.  To afford each patient quality time with our provider, please arrive at least 15 minutes before your scheduled appointment time.    If you have a lab appointment with the [facility] please come in thru the  Main Entrance and check in at the main information desk  You need to re-schedule your appointment should you arrive 10 or more minutes late.  We strive to give you quality time with our providers, and arriving late affects you and other patients whose appointments are after yours.  Also, if you no show three or more times for appointments you may be dismissed from the clinic at the providers discretion.     Again, thank you for choosing [facility].  Our hope is that these requests will decrease the amount of time that you wait before being seen by our physicians.       _____________________________________________________________  Should you have questions after your visit to [facility], please contact our office at [phone] between the hours of 8:30 a.m. and 4:30 p.m.  Voicemails left after 4:30 p.m. will not be returned until the following business day.  For prescription refill requests, have your pharmacy contact our office.       Resources For Cancer Patients and their Caregivers ? American Cancer Society: Can assist with transportation, wigs, general needs, runs Look Good Feel Better.        ([phone] 481-9872 ? Cancer Care: Provides financial assistance, online support groups, medication/co-pay assistance.  1-800-813-HOPE (731) 336-4284) ? [place] Assists [place] cancer patients and their families  through emotional , educational and financial support.  587-434-2490 ? [place] DSS Where to apply for food stamps, Medicaid and utility assistance. 956-852-3561 ? [facility]: Transportation to medical appointments. (450)607-6238 ? Social Security Administration: May apply for disability if have a Stage IV cancer. 806 671 2779 269-764-6680 ? [employer], Disability and Transit Services: Assists with nutrition, care and transit needs. [place] Support Programs: @10RELATIVEDAYS @ > Cancer Support Group  2nd Tuesday of the month 1pm-2pm, Journey Room  > Creative Journey  3rd Tuesday of the month 1130am-1pm, Journey Room  > Look Good Feel Better  1st Wednesday of the month 10am-12 noon, Journey Room (Call Catharine to register [phone])

## 2017-01-25 DIAGNOSIS — Z6835 Body mass index (BMI) 35.0-35.9, adult: Secondary | ICD-10-CM | POA: Diagnosis not present

## 2017-01-25 DIAGNOSIS — J069 Acute upper respiratory infection, unspecified: Secondary | ICD-10-CM | POA: Diagnosis not present

## 2017-03-05 DIAGNOSIS — E1165 Type 2 diabetes mellitus with hyperglycemia: Secondary | ICD-10-CM | POA: Diagnosis not present

## 2017-03-05 DIAGNOSIS — E782 Mixed hyperlipidemia: Secondary | ICD-10-CM | POA: Diagnosis not present

## 2017-03-05 DIAGNOSIS — R7301 Impaired fasting glucose: Secondary | ICD-10-CM | POA: Diagnosis not present

## 2017-03-05 DIAGNOSIS — E039 Hypothyroidism, unspecified: Secondary | ICD-10-CM | POA: Diagnosis not present

## 2017-03-06 DIAGNOSIS — R69 Illness, unspecified: Secondary | ICD-10-CM | POA: Diagnosis not present

## 2017-03-07 DIAGNOSIS — R7301 Impaired fasting glucose: Secondary | ICD-10-CM | POA: Diagnosis not present

## 2017-03-07 DIAGNOSIS — C50919 Malignant neoplasm of unspecified site of unspecified female breast: Secondary | ICD-10-CM | POA: Diagnosis not present

## 2017-03-07 DIAGNOSIS — M25569 Pain in unspecified knee: Secondary | ICD-10-CM | POA: Diagnosis not present

## 2017-03-07 DIAGNOSIS — Z6834 Body mass index (BMI) 34.0-34.9, adult: Secondary | ICD-10-CM | POA: Diagnosis not present

## 2017-03-07 DIAGNOSIS — Z8711 Personal history of peptic ulcer disease: Secondary | ICD-10-CM | POA: Diagnosis not present

## 2017-03-07 DIAGNOSIS — E039 Hypothyroidism, unspecified: Secondary | ICD-10-CM | POA: Diagnosis not present

## 2017-03-07 DIAGNOSIS — Z23 Encounter for immunization: Secondary | ICD-10-CM | POA: Diagnosis not present

## 2017-05-31 DIAGNOSIS — Z720 Tobacco use: Secondary | ICD-10-CM | POA: Diagnosis not present

## 2017-05-31 DIAGNOSIS — G8929 Other chronic pain: Secondary | ICD-10-CM | POA: Diagnosis not present

## 2017-05-31 DIAGNOSIS — Z6835 Body mass index (BMI) 35.0-35.9, adult: Secondary | ICD-10-CM | POA: Diagnosis not present

## 2017-05-31 DIAGNOSIS — Z7982 Long term (current) use of aspirin: Secondary | ICD-10-CM | POA: Diagnosis not present

## 2017-05-31 DIAGNOSIS — E785 Hyperlipidemia, unspecified: Secondary | ICD-10-CM | POA: Diagnosis not present

## 2017-05-31 DIAGNOSIS — K219 Gastro-esophageal reflux disease without esophagitis: Secondary | ICD-10-CM | POA: Diagnosis not present

## 2017-05-31 DIAGNOSIS — E039 Hypothyroidism, unspecified: Secondary | ICD-10-CM | POA: Diagnosis not present

## 2017-05-31 DIAGNOSIS — I10 Essential (primary) hypertension: Secondary | ICD-10-CM | POA: Diagnosis not present

## 2017-05-31 DIAGNOSIS — K5900 Constipation, unspecified: Secondary | ICD-10-CM | POA: Diagnosis not present

## 2017-06-05 DIAGNOSIS — M25562 Pain in left knee: Secondary | ICD-10-CM | POA: Diagnosis not present

## 2017-06-05 DIAGNOSIS — Z8781 Personal history of (healed) traumatic fracture: Secondary | ICD-10-CM | POA: Diagnosis not present

## 2017-06-05 DIAGNOSIS — M1732 Unilateral post-traumatic osteoarthritis, left knee: Secondary | ICD-10-CM | POA: Diagnosis not present

## 2017-07-02 ENCOUNTER — Encounter: Payer: Self-pay | Admitting: Genetics

## 2017-07-30 DIAGNOSIS — E782 Mixed hyperlipidemia: Secondary | ICD-10-CM | POA: Diagnosis not present

## 2017-07-30 DIAGNOSIS — M810 Age-related osteoporosis without current pathological fracture: Secondary | ICD-10-CM | POA: Diagnosis not present

## 2017-07-30 DIAGNOSIS — E039 Hypothyroidism, unspecified: Secondary | ICD-10-CM | POA: Diagnosis not present

## 2017-07-30 DIAGNOSIS — R7301 Impaired fasting glucose: Secondary | ICD-10-CM | POA: Diagnosis not present

## 2017-07-30 DIAGNOSIS — K429 Umbilical hernia without obstruction or gangrene: Secondary | ICD-10-CM | POA: Diagnosis not present

## 2017-07-30 DIAGNOSIS — E1165 Type 2 diabetes mellitus with hyperglycemia: Secondary | ICD-10-CM | POA: Diagnosis not present

## 2017-07-30 DIAGNOSIS — L98499 Non-pressure chronic ulcer of skin of other sites with unspecified severity: Secondary | ICD-10-CM | POA: Diagnosis not present

## 2017-07-30 DIAGNOSIS — L02212 Cutaneous abscess of back [any part, except buttock]: Secondary | ICD-10-CM | POA: Diagnosis not present

## 2017-07-30 DIAGNOSIS — L03312 Cellulitis of back [any part except buttock]: Secondary | ICD-10-CM | POA: Diagnosis not present

## 2017-07-30 DIAGNOSIS — Z6835 Body mass index (BMI) 35.0-35.9, adult: Secondary | ICD-10-CM | POA: Diagnosis not present

## 2017-08-23 DIAGNOSIS — R69 Illness, unspecified: Secondary | ICD-10-CM | POA: Diagnosis not present

## 2017-09-10 DIAGNOSIS — E1165 Type 2 diabetes mellitus with hyperglycemia: Secondary | ICD-10-CM | POA: Diagnosis not present

## 2017-09-10 DIAGNOSIS — E782 Mixed hyperlipidemia: Secondary | ICD-10-CM | POA: Diagnosis not present

## 2017-09-12 DIAGNOSIS — C50919 Malignant neoplasm of unspecified site of unspecified female breast: Secondary | ICD-10-CM | POA: Diagnosis not present

## 2017-09-12 DIAGNOSIS — R911 Solitary pulmonary nodule: Secondary | ICD-10-CM | POA: Diagnosis not present

## 2017-09-12 DIAGNOSIS — Z6834 Body mass index (BMI) 34.0-34.9, adult: Secondary | ICD-10-CM | POA: Diagnosis not present

## 2017-09-12 DIAGNOSIS — Z8711 Personal history of peptic ulcer disease: Secondary | ICD-10-CM | POA: Diagnosis not present

## 2017-09-12 DIAGNOSIS — M810 Age-related osteoporosis without current pathological fracture: Secondary | ICD-10-CM | POA: Diagnosis not present

## 2017-09-12 DIAGNOSIS — M25569 Pain in unspecified knee: Secondary | ICD-10-CM | POA: Diagnosis not present

## 2017-09-12 DIAGNOSIS — E039 Hypothyroidism, unspecified: Secondary | ICD-10-CM | POA: Diagnosis not present

## 2017-09-12 DIAGNOSIS — R7303 Prediabetes: Secondary | ICD-10-CM | POA: Diagnosis not present

## 2017-09-12 DIAGNOSIS — R7301 Impaired fasting glucose: Secondary | ICD-10-CM | POA: Diagnosis not present

## 2017-09-12 DIAGNOSIS — E782 Mixed hyperlipidemia: Secondary | ICD-10-CM | POA: Diagnosis not present

## 2017-10-12 ENCOUNTER — Other Ambulatory Visit: Payer: Self-pay | Admitting: *Deleted

## 2017-10-12 ENCOUNTER — Encounter: Payer: Self-pay | Admitting: *Deleted

## 2017-10-12 ENCOUNTER — Encounter: Payer: Self-pay | Admitting: Gastroenterology

## 2017-10-12 ENCOUNTER — Ambulatory Visit (INDEPENDENT_AMBULATORY_CARE_PROVIDER_SITE_OTHER): Payer: Medicare HMO | Admitting: Gastroenterology

## 2017-10-12 VITALS — BP 136/77 | HR 82 | Temp 98.2°F | Ht 64.0 in | Wt 200.4 lb

## 2017-10-12 DIAGNOSIS — Z86010 Personal history of colonic polyps: Secondary | ICD-10-CM

## 2017-10-12 DIAGNOSIS — K2100 Gastro-esophageal reflux disease with esophagitis, without bleeding: Secondary | ICD-10-CM

## 2017-10-12 DIAGNOSIS — K5900 Constipation, unspecified: Secondary | ICD-10-CM

## 2017-10-12 MED ORDER — PEG 3350-KCL-NA BICARB-NACL 420 G PO SOLR: 4000.0000 mL | Freq: Once | ORAL | 0 refills | Status: AC

## 2017-10-12 NOTE — Progress Notes (Signed)
Primary Care Physician:  Celene Squibb, MD Primary Gastroenterologist:  Dr. Gala Romney   Chief Complaint  Patient presents with  . Colonoscopy    consult  . Constipation  . Gastroesophageal Reflux    HPI:   [name] is a 77 y.o. female presenting today to arrange surveillance colonoscopy. She has a distant history of polyps and was due for surveillance in 2017. Last colonoscopy actually in 2012 with POOR PREP. She was last seen in Nov 2014. EGD last completed in 2014 with dilation of Schatzki's ring. She has known erosive esophagitis and also distant history of PUD. Negative H.pylori.    Constipation: 2-3 days up to a week without a BM. Will take Metamucil or Miralax if needed. When taking laxatives, will easily have incontinence. Doesn't want a medication that will possibly cause diarrhea.    GERD: not on a PPI. Several episodes a month. No dysphagia. No abdominal pain. No NSAIDs. Does not want to take a daily medication. Does not like taking pills. Declining daily PPI use. Unclear why she had a history of PUD after review of records, but surveillance EGD is on file.    Past Medical History:  Diagnosis Date  . Abnormal heart rhythms   . Allergic rhinitis   . Anxiety   . Arthritis   . Bronchitis   . Chest wall pain    ANTERIOR  . Depression   . Dysuria   . Eustachian tube dysfunction   . Gallstones   . Gallstones   . GERD (gastroesophageal reflux disease)   . Hyperlipidemia   . Hypertension   . Hypothyroidism   . Hypoxemia   . Incontinence   . Liver function study, abnormal   . Malaise and fatigue   . Microscopic hematuria   . Osteopenia   . Personal history of colonic polyps   . Primary breast malignancy ([place])   . Snoring   . Tobacco abuse   . Ulcer     Past Surgical History:  Procedure Laterality Date  . BILATERAL MASTECTOMY    . COLONOSCOPY  4/07   WUJ:WJXBJYN internal hemorrhoids otherwise normal  . COLONOSCOPY  05/25/2010   WGN:FAOZHYQM  hemorrhoids, otherwise normal rectum and colon.(POOR PREP). Due for surveillance 2017.   Marland Kitchen COLONOSCOPY  04/22/2002     RMR: Internal hemorrhoids, otherwise normal rectum  Polyps in the left colon resected with snare  . ESOPHAGOGASTRODUODENOSCOPY  05/25/2010   RMR:EGD distal esophageal erosions consistent with mild erosive reflux esophagitis, otherwise unremarkable esophagus status post passage of a 56-French Maloney dilator/Small hiatal hernia/Gastric ulcer with negative H.pylori. Overdue for surveillance.   . ESOPHAGOGASTRODUODENOSCOPY N/A 03/10/2013   Dr. Gala Romney: Schatzki's ring s/p dilation, erosive reflux esophagitis, hiatal hernia, gastric erosions, negative h.pylori  . LEFT LEG SURGERY     MULTIPLE DUE TO FX  . LEFT RTC TEAR    . MALONEY DILATION N/A 03/10/2013   Procedure: Venia Minks DILATION;  Surgeon: Daneil Dolin, MD;  [facility];  Service: Endoscopy;  Laterality: N/A;  . SAVORY DILATION N/A 03/10/2013   Procedure: SAVORY DILATION;  Surgeon: Daneil Dolin, MD;  [facility];  Service: Endoscopy;  Laterality: N/A;  . TONSILECTOMY, ADENOIDECTOMY, BILATERAL MYRINGOTOMY AND TUBES    . TONSILLECTOMY    . VESICOVAGINAL FISTULA CLOSURE W/ TAH     excessive bleeding-benign    Current Outpatient Medications  Medication Sig Dispense Refill  . acetaminophen (TYLENOL) 500 MG tablet Take 1,000 mg by  mouth as needed.    Marland Kitchen amLODipine (NORVASC) 10 MG tablet Take 10 mg by mouth daily.      Marland Kitchen aspirin EC 81 MG tablet Take 81 mg by mouth daily.    Marland Kitchen atorvastatin (LIPITOR) 40 MG tablet Take 40 mg by mouth daily.    Marland Kitchen levothyroxine (SYNTHROID, LEVOTHROID) 137 MCG tablet Take 137 mcg by mouth daily.  5  . lisinopril-hydrochlorothiazide (PRINZIDE,ZESTORETIC) 20-12.5 MG per tablet Take 1 tablet by mouth daily.       No current facility-administered medications for this visit.     Allergies as of 10/12/2017 - Review Complete 10/12/2017  Allergen Reaction Noted  . Sulfonamide  derivatives  03/21/2006    Family History  Problem Relation Age of Onset  . Colon cancer Neg Hx     Social History   Socioeconomic History  . Marital status: Single    Spouse name: Not on file  . Number of children: Not on file  . Years of education: Not on file  . Highest education level: Not on file  Occupational History  . Occupation: retired  Scientific laboratory technician  . Financial resource strain: Not on file  . Food insecurity:    Worry: Not on file    Inability: Not on file  . Transportation needs:    Medical: Not on file    Non-medical: Not on file  Tobacco Use  . Smoking status: Current Every Day Smoker    Packs/day: 0.50    Years: 25.00    Pack years: 12.50    Types: Cigarettes  . Smokeless tobacco: Never Used  . Tobacco comment: Smokes a pack of cigarettes about every 2-3 days  Substance and Sexual Activity  . Alcohol use: No  . Drug use: No  . Sexual activity: Not Currently  Lifestyle  . Physical activity:    Days per week: Not on file    Minutes per session: Not on file  . Stress: Not on file  Relationships  . Social connections:    Talks on phone: Not on file    Gets together: Not on file    Attends religious service: Not on file    Active member of club or organization: Not on file    Attends meetings of clubs or organizations: Not on file    Relationship status: Not on file  . Intimate partner violence:    Fear of current or ex partner: Not on file    Emotionally abused: Not on file    Physically abused: Not on file    Forced sexual activity: Not on file  Other Topics Concern  . Not on file  Social History Narrative  . Not on file    Review of Systems: Gen: Denies any fever, chills, fatigue, weight loss, lack of appetite.  CV: Denies chest pain, heart palpitations, peripheral edema, syncope.  Resp: Denies shortness of breath at rest or with exertion. Denies wheezing or cough.  GI: see HPI  GU : Denies urinary burning, urinary frequency, urinary  hesitancy MS: chronic lower extremity joint pain  Derm: Denies rash, itching, dry skin Psych: Denies depression, anxiety, memory loss, and confusion Heme: Denies bruising, bleeding, and enlarged lymph nodes.  Physical Exam: BP 136/77   Pulse 82   Temp 98.2 F (36.8 C) (Oral)   Ht 5\' 4"  (1.626 m)   Wt 200 lb 6.4 oz (90.9 kg)   BMI 34.40 kg/m  General:   Alert and oriented. Pleasant and cooperative. Well-nourished and well-developed.  Head:  Normocephalic and atraumatic. Eyes:  Without icterus, sclera clear and conjunctiva pink.  Ears:  Normal auditory acuity. Nose:  No deformity, discharge,  or lesions. Mouth:  No deformity or lesions, oral mucosa pink.  Lungs:  Clear to auscultation bilaterally. No wheezes, rales, or rhonchi. No distress.  Heart:  S1, S2 present without murmurs appreciated.  Abdomen:  +BS, soft, non-tender and non-distended. No HSM noted. No guarding or rebound. No masses appreciated.  Rectal:  Deferred  Msk:  Symmetrical without gross deformities. Normal posture. Extremities:  Without edema. Neurologic:  Alert and  oriented x4 Psych:  Alert and cooperative. Normal mood and affect.

## 2017-10-12 NOTE — Assessment & Plan Note (Signed)
With known history of esophagitis, distant history of PUD (negative H.pylori). Unclear if this was related to NSAIDs. She is currently not on a PPI and notes intermittent GERD flares. I discussed guidelines of daily PPI at lowest effective dosing especially in light of known history of esophagitis AND idiopathic PUD. Discussed possible risks of uncontrolled GERD over long-term and risk of complicated GERD. She has no alarm signs/symptoms. She is declining PPI use but would like to try Zantac as needed each evening. She understands the rationale of a daily PPI in her case but has decided to only use Zantac as needed. Signs/symptoms to report discussed. Return in 4 months.

## 2017-10-12 NOTE — Progress Notes (Signed)
cc'ed to pcp °

## 2017-10-12 NOTE — Patient Instructions (Signed)
For constipation: try the samples of Amitiza I have given you. Take one each evening with dinner. Make sure to take with food to avoid nausea. You can increase this to twice a day if needed. Let me know how this works, so I can send in a prescription.  As we discussed, ideally I would recommend a reflux medication daily (such as Protonix, Prilosec, etc). However, you can take the Zantac each evening as needed. Please call if problems swallowing, worsening reflux, etc.  We have arranged a colonoscopy in the future with an extra 1/2 day of clear liquids to ensure the prep is good.   We will see you back in about 4 months to follow-up on constipation and reflux!  It was a pleasure to see you today. I strive to create trusting relationships with patients to provide genuine, compassionate, and quality care. I value your feedback. If you receive a survey regarding your visit,  I greatly appreciate you taking time to fill this out.   Annitta Needs, PhD, ANP-BC [facility]

## 2017-10-12 NOTE — Assessment & Plan Note (Signed)
76 year old female with remote history of polyps, overdue for surveillance. Last colonoscopy with poor prep in 2012. No concerning lower GI symptoms. Baseline constipation needs to be addressed prior to colonoscopy. See constipation.  Proceed with TCS with Dr. Gala Romney in near future: the risks, benefits, and alternatives have been discussed with the patient in detail. The patient states understanding and desires to proceed. Additional 1/2 day of clear liquids prior Starting on bowel regimen (Amitiza)  Return in 4 months

## 2017-10-12 NOTE — Assessment & Plan Note (Signed)
Start Amitiza 24 mcg once each evening with dinner, increasing to BID if tolerated. Call for progress report and will send in appropriate prescription. Patient is hesitant to take daily pills, but we discussed pursuing lowest dose for good results and best quality of life, especially in preparation for upcoming colonoscopy and history of poor prep historically.

## 2017-11-05 ENCOUNTER — Ambulatory Visit (HOSPITAL_COMMUNITY): Payer: Medicare HMO

## 2017-11-05 ENCOUNTER — Telehealth: Payer: Self-pay | Admitting: *Deleted

## 2017-11-05 ENCOUNTER — Inpatient Hospital Stay (HOSPITAL_COMMUNITY): Payer: Medicare HMO | Attending: Hematology | Admitting: Internal Medicine

## 2017-11-05 ENCOUNTER — Encounter (HOSPITAL_COMMUNITY): Payer: Self-pay | Admitting: Internal Medicine

## 2017-11-05 ENCOUNTER — Other Ambulatory Visit: Payer: Self-pay

## 2017-11-05 VITALS — BP 136/61 | HR 89 | Temp 98.2°F | Resp 18 | Wt 199.9 lb

## 2017-11-05 DIAGNOSIS — Z9013 Acquired absence of bilateral breasts and nipples: Secondary | ICD-10-CM | POA: Diagnosis not present

## 2017-11-05 DIAGNOSIS — Z853 Personal history of malignant neoplasm of breast: Secondary | ICD-10-CM | POA: Diagnosis not present

## 2017-11-05 DIAGNOSIS — R911 Solitary pulmonary nodule: Secondary | ICD-10-CM | POA: Insufficient documentation

## 2017-11-05 DIAGNOSIS — R1310 Dysphagia, unspecified: Secondary | ICD-10-CM | POA: Diagnosis not present

## 2017-11-05 DIAGNOSIS — R0981 Nasal congestion: Secondary | ICD-10-CM | POA: Diagnosis not present

## 2017-11-05 DIAGNOSIS — Z87891 Personal history of nicotine dependence: Secondary | ICD-10-CM | POA: Diagnosis not present

## 2017-11-05 DIAGNOSIS — C50919 Malignant neoplasm of unspecified site of unspecified female breast: Secondary | ICD-10-CM

## 2017-11-05 NOTE — Patient Instructions (Signed)
[facility] Discharge Instructions  Today you saw Dr. Higgs.    Thank you for choosing [facility] at [facility] to provide your oncology and hematology care.  To afford each patient quality time with our provider, please arrive at least 15 minutes before your scheduled appointment time.   If you have a lab appointment with the Cancer Center please come in thru the  Main Entrance and check in at the main information desk  You need to re-schedule your appointment should you arrive 10 or more minutes late.  We strive to give you quality time with our providers, and arriving late affects you and other patients whose appointments are after yours.  Also, if you no show three or more times for appointments you may be dismissed from the clinic at the providers discretion.     Again, thank you for choosing [facility].  Our hope is that these requests will decrease the amount of time that you wait before being seen by our physicians.       _____________________________________________________________  Should you have questions after your visit to [facility], please contact our office at [phone] between the hours of 8:30 a.m. and 4:30 p.m.  Voicemails left after 4:30 p.m. will not be returned until the following business day.  For prescription refill requests, have your pharmacy contact our office.       Resources For Cancer Patients and their Caregivers ? American Cancer Society: Can assist with transportation, wigs, general needs, runs Look Good Feel Better.        1-888-227-6333 ? Cancer Care: Provides financial assistance, online support groups, medication/co-pay assistance.  1-800-813-HOPE (4673) ? [facility] Assists [place] cancer patients and their families through emotional , educational and financial support.  336-427-4357 ? [place] DSS Where to apply for  food stamps, Medicaid and utility assistance. 336-342-1394 ? [facility]: Transportation to medical appointments. 336-347-2287 ? Social Security Administration: May apply for disability if have a Stage IV cancer. 336-342-7796 1-800-772-1213 ? [place] Aging, Disability and Transit Services: Assists with nutrition, care and transit needs. 336-349-2343  Cancer Center Support Programs:   > Cancer Support Group  2nd Tuesday of the month 1pm-2pm, Journey Room   > Creative Journey  3rd Tuesday of the month 1130am-1pm, Journey Room    

## 2017-11-05 NOTE — Telephone Encounter (Signed)
She was not reporting this at last visit. Is there a place to have her come back and update H&P? Is there room for an EGD/dilation?

## 2017-11-05 NOTE — Progress Notes (Signed)
Diagnosis Lobular carcinoma of breast, unspecified laterality ([place]) - Plan: PR BREAST PROSTHESIS BRA & FORM  Solitary pulmonary nodule - Plan: CT Chest Wo Contrast  Staging Cancer Staging No matching staging information was found for the patient.  Assessment and Plan:  1.  Bilateral breast cancer.  The pt is s/p mastectomy on 02/16/1998 for a left sided invasive lobular carcinoma stage I. She was treated with tamoxifen for 5 years and completed therapy in  November 2004. She is s/p resection of a right-sided invasive lobular carcinoma stage II on 05/19/2005 with mastectomy and status post Aromasin, but then switched to Arimidex due to cost, completing therapy in  February 2012.  Pt is seen today for follow-up.  She reports she has labs done with PCP and was told recent results were WNL.  She clinically has NED on exam today.  She will RTC in 10/2018 for follow-up.    2  Pulmonary nodule.  Pt had CT chest done 10/27/2017 that showed    IMPRESSION: 1. Ground-glass 2.6 cm posterior right upper lobe pulmonary nodule, stable back to 09/09/2014. Repeat CT is recommended every 2 years until 5 years of stability has been established. This recommendation follows the consensus statement: Guidelines for Management of Incidental Pulmonary Nodules Detected on CT Images: From the Fleischner Society 2017; Radiology 2017; 284:228-243. 2. Posterior left upper lobe 8 mm solid pulmonary nodule, stable back to 09/09/2014, considered benign. 3. No acute cardiopulmonary disease. 4. Additional findings include 2 vessel coronary atherosclerosis, stable small pericardial effusion/thickening and small hiatal Hernia  Scans reviewed and pt is given copy of report.    The pt reports she is no longer smoking.  She will be set up for repeat imaging in 10/2018, but should notify the office if she has any worsening respiratory symptoms prior to her next visit.    3.  Sinus congestion.  She can do OTC Zyrtec or  Claritin and should notify PCP if no improvement in symptoms.    4.   Dysphagia.  Pt reports food sticking occurs with swallowing.  She is scheduled for colonoscopy and office will notify GI of pt symptoms to determine if repeat EGD recommended.    5.  Bra Prosthesis.  Pt is requesting Rx for prosthesis and she is given a RX today.    Greater than 35 minutes spent with more than 50% spent in counseling and coordination of care.     Interval History:  Historical data obtained from the note dated 10/31/2016:  77 y.o. Female followed by Dr. Talbert Cage due to bilateral breast cancers. She previously was seen at [facility]  in 2013 but then left when her oncologist Dr. Tressie Stalker left. She has been following with him once a year since then. She is status post mastectomy on 02/16/1998 for a left sided invasive lobular carcinoma stage I. Status post tamoxifen for 5 years for this cancer ending in November 2004. Status post resection of a right-sided invasive lobular carcinoma stage II on 05/19/2005 with mastectomy and status post Aromasin, but then switched to Arimidex due to cost, finishing as of February 2012. Patient has not had a recurrence since then.   Current Status:  Pt is seen today for follow-up.  She is questioning if she needs repeat scans.  She needs bras prosthesis.  She reports she is no longer smoking.  She has been having some problems with sinus congestion.  Pt also reports she feels like food is sticking.     Problem List Patient  Active Problem List   Diagnosis Date Noted  . Lobular carcinoma of breast ([place]) [C50.919] 10/31/2016  . Constipation [K59.00] 05/11/2010  . Other dysphagia [R13.19] 05/11/2010  . ABDOMINAL PAIN, UNSPECIFIED SITE [R10.9] 05/11/2010  . TOBACCO ABUSE [F17.200] 11/04/2008  . COUGH [R05] 09/04/2008  . ANXIETY DEPRESSION [F34.1] 08/07/2008  . OTITIS MEDIA, CHRONIC [H66.90] 05/07/2008  . DEGENERATIVE JOINT DISEASE [M19.90] 02/18/2008  . GALLSTONES [K80.20] 09/23/2007  .  HYPOXEMIA [R09.02] 04/30/2007  . MICROSCOPIC HEMATURIA [R31.29] 04/16/2007  . INCONTINENCE [R32] 04/16/2007  . History of colonic polyps [Z86.010] 03/21/2006  . HYPOTHYROIDISM [E03.9] 03/21/2006  . HYPERLIPIDEMIA [E78.5] 03/21/2006  . CATARACT NOS [H26.9] 03/21/2006  . HYPERTENSION [I10] 03/21/2006  . ALLERGIC RHINITIS [J30.9] 03/21/2006  . GERD [K21.9] 03/21/2006  . ARTHRITIS [M12.9] 03/21/2006  . OSTEOPENIA [M89.9, M94.9] 03/21/2006  . HX, PERSONAL, MALIGNANCY, BREAST [Z85.3] 03/21/2006    Past Medical History Past Medical History:  Diagnosis Date  . Abnormal heart rhythms   . Allergic rhinitis   . Anxiety   . Arthritis   . Bronchitis   . Chest wall pain    ANTERIOR  . Depression   . Dysuria   . Eustachian tube dysfunction   . Gallstones   . Gallstones   . GERD (gastroesophageal reflux disease)   . Hyperlipidemia   . Hypertension   . Hypothyroidism   . Hypoxemia   . Incontinence   . Liver function study, abnormal   . Malaise and fatigue   . Microscopic hematuria   . Osteopenia   . Personal history of colonic polyps   . Primary breast malignancy ([place])   . Snoring   . Tobacco abuse   . Ulcer     Past Surgical History Past Surgical History:  Procedure Laterality Date  . BILATERAL MASTECTOMY    . COLONOSCOPY  4/07   GGY:IRSWNIO internal hemorrhoids otherwise normal  . COLONOSCOPY  05/25/2010   EVO:JJKKXFGH hemorrhoids, otherwise normal rectum and colon.(POOR PREP). Due for surveillance 2017.   Marland Kitchen COLONOSCOPY  04/22/2002     RMR: Internal hemorrhoids, otherwise normal rectum  Polyps in the left colon resected with snare  . ESOPHAGOGASTRODUODENOSCOPY  05/25/2010   RMR:EGD distal esophageal erosions consistent with mild erosive reflux esophagitis, otherwise unremarkable esophagus status post passage of a 56-French Maloney dilator/Small hiatal hernia/Gastric ulcer with negative H.pylori. Overdue for surveillance.   . ESOPHAGOGASTRODUODENOSCOPY N/A 03/10/2013   Dr.  Gala Romney: Schatzki's ring s/p dilation, erosive reflux esophagitis, hiatal hernia, gastric erosions, negative h.pylori  . LEFT LEG SURGERY     MULTIPLE DUE TO FX  . LEFT RTC TEAR    . MALONEY DILATION N/A 03/10/2013   Procedure: Venia Minks DILATION;  Surgeon: Daneil Dolin, MD;  [facility];  Service: Endoscopy;  Laterality: N/A;  . SAVORY DILATION N/A 03/10/2013   Procedure: SAVORY DILATION;  Surgeon: Daneil Dolin, MD;  [facility];  Service: Endoscopy;  Laterality: N/A;  . TONSILECTOMY, ADENOIDECTOMY, BILATERAL MYRINGOTOMY AND TUBES    . TONSILLECTOMY    . VESICOVAGINAL FISTULA CLOSURE W/ TAH     excessive bleeding-benign    Family History Family History  Problem Relation Age of Onset  . Colon cancer Neg Hx      Social History  reports that she has been smoking cigarettes.  She has a 12.50 pack-year smoking history. She has never used smokeless tobacco. She reports that she does not drink alcohol or use drugs.  Medications  Current Outpatient Medications:  .  acetaminophen (  TYLENOL) 500 MG tablet, Take 1,000 mg by mouth as needed., Disp: , Rfl:  .  amLODipine (NORVASC) 10 MG tablet, Take 10 mg by mouth daily.  , Disp: , Rfl:  .  aspirin EC 81 MG tablet, Take 81 mg by mouth daily., Disp: , Rfl:  .  atorvastatin (LIPITOR) 40 MG tablet, Take 40 mg by mouth daily., Disp: , Rfl:  .  levothyroxine (SYNTHROID, LEVOTHROID) 137 MCG tablet, Take 137 mcg by mouth daily., Disp: , Rfl: 5 .  lisinopril-hydrochlorothiazide (PRINZIDE,ZESTORETIC) 20-12.5 MG per tablet, Take 1 tablet by mouth daily.  , Disp: , Rfl:   Allergies Sulfonamide derivatives  Review of Systems Review of Systems - Oncology ROS negative other than sinus congestion and food sticking   Physical Exam  Vitals Wt Readings from Last 3 Encounters:  11/05/17 199 lb 14.4 oz (90.7 kg)  10/12/17 200 lb 6.4 oz (90.9 kg)  10/31/16 200 lb (90.7 kg)   Temp Readings from Last 3 Encounters:  11/05/17 98.2  F (36.8 C) (Oral)  10/12/17 98.2 F (36.8 C) (Oral)  10/31/16 98.1 F (36.7 C) (Oral)   BP Readings from Last 3 Encounters:  11/05/17 136/61  10/12/17 136/77  10/31/16 140/72   Pulse Readings from Last 3 Encounters:  11/05/17 89  10/12/17 82  10/31/16 85    Constitutional: Well-developed, well-nourished, and in no distress.   HENT: Head: Normocephalic and atraumatic.  Mouth/Throat: No oropharyngeal exudate. Mucosa moist. Eyes: Pupils are equal, round, and reactive to light. Conjunctivae are normal. No scleral icterus.  Neck: Normal range of motion. Neck supple. No JVD present.  Cardiovascular: Normal rate, regular rhythm and normal heart sounds.  Exam reveals no gallop and no friction rub.   No murmur heard. Pulmonary/Chest: Effort normal and breath sounds normal. No respiratory distress. No wheezes.No rales.  Abdominal: Soft. Bowel sounds are normal. No distension. There is no tenderness. There is no guarding.  Musculoskeletal: No edema or tenderness.  Lymphadenopathy: No cervical, axillary or supraclavicular adenopathy.  Neurological: Alert and oriented to person, place, and time. No cranial nerve deficit.  Skin: Skin is warm and dry. No rash noted. No erythema. No pallor.  Psychiatric: Affect and judgment normal.  Bilateral breast exam:  Bilateral mastectomies healed well.  No nodules noted or palpable signs of chest wall recurrence.    Labs No visits with results within 3 Day(s) from this visit.  Latest known visit with results is:  Abstract on 03/06/2013  Component Date Value Ref Range Status  . HGB 02/28/2013 13.7  g/dL Final  . HCT 02/28/2013 40  % Final  . Platelets 02/28/2013 286  K/L Final  . Total Bilirubin 02/28/2013 0.4  mg/dL Final  . AST 02/28/2013 27  U/L Final  . ALT 02/28/2013 42* 7 - 35 U/L Final  . TSH 02/28/2013 3.23  0.41 - 5.90 uIU/mL Final     Pathology Orders Placed This Encounter  Procedures  . CT Chest Wo Contrast    Standing Status:    Future    Standing Expiration Date:   11/05/2018    Order Specific Question:   Preferred imaging location?    Answer:   [facility]    Order Specific Question:   Radiology Contrast Protocol - do NOT remove file path    Answer:   [email or web address]  . PR BREAST PROSTHESIS BRA & FORM    Bra prosthesis       Zoila Shutter MD

## 2017-11-05 NOTE — Telephone Encounter (Signed)
Yes there is room. I scheduled patient an appt for 11/15/17 at 2:30pm with LSL.

## 2017-11-05 NOTE — Telephone Encounter (Signed)
Amy from the cancer center called. Dr. Walden Field is wanting to know if patient can have EGD added on to her TCS scheduled for 11/30/17 with RMR. Patient is having trouble with her swallowing. AB saw patient in office. Please advise thanks

## 2017-11-15 ENCOUNTER — Other Ambulatory Visit: Payer: Self-pay

## 2017-11-15 ENCOUNTER — Encounter: Payer: Self-pay | Admitting: Gastroenterology

## 2017-11-15 ENCOUNTER — Ambulatory Visit (INDEPENDENT_AMBULATORY_CARE_PROVIDER_SITE_OTHER): Payer: Medicare HMO | Admitting: Gastroenterology

## 2017-11-15 VITALS — BP 126/66 | HR 84 | Temp 97.9°F | Ht 64.0 in | Wt 203.0 lb

## 2017-11-15 DIAGNOSIS — K5900 Constipation, unspecified: Secondary | ICD-10-CM

## 2017-11-15 DIAGNOSIS — Z86010 Personal history of colonic polyps: Secondary | ICD-10-CM

## 2017-11-15 DIAGNOSIS — R1310 Dysphagia, unspecified: Secondary | ICD-10-CM | POA: Diagnosis not present

## 2017-11-15 DIAGNOSIS — R1319 Other dysphagia: Secondary | ICD-10-CM | POA: Insufficient documentation

## 2017-11-15 NOTE — H&P (View-Only) (Signed)
Primary Care Physician:  Celene Squibb, MD  Primary Gastroenterologist:  Garfield Cornea, MD   Chief Complaint  Patient presents with  . Dysphagia    occ, scheduled for tcs 11/30/17  . Constipation    problems with Amitiza    HPI:  [name] is a 77 y.o. female here to discuss possibly having an upper endoscopy for dysphagia at time of her upcoming surveillance colonoscopy.  She was seen back in June.  Distant history of colon polyps, was due for surveillance in 2017.  Last colonoscopy in 2012 with poor prep.  She had a EGD in 2014 with dilation of Schatzki ring.  Also with history of known erosive esophagitis and distant history of peptic ulcer disease.  Negative H. Pylori.  Patient states that she reported no issues swallowing previously to [place] but she misunderstood questioning regarding this matter.  She went to see her hematologist and completed a survey which inquired about difficulty swallowing.  Dr. Walden Field is requesting consideration of EGD if recommended.  She complains of dysphagia to bread, meats.  Has to be very careful when eating these items or they become lodged and she has to wash it down with liquids.  The symptoms occur about 2-3 times per month.  She has a history of Schatzki ring in the past. She has a history of GERD in the past, for the most part has declined daily PPI use.     She has a history of constipation, has been on Metamucil or MiraLAX when needed in the past.  Reports sensitivity to laxatives and sometimes cause incontinence.  She was started on Amitiza 24 mcg at bedtime at her last office visit.  She feels like this is not resulting in adequate bowel movements.  Really not passing significant stool aside from a large BM about 10 days ago.  No abdominal pain.      Current Outpatient Medications  Medication Sig Dispense Refill  . acetaminophen (TYLENOL) 500 MG tablet Take 1,000 mg by mouth as needed.    Marland Kitchen amLODipine (NORVASC) 10 MG tablet Take 10 mg by mouth  daily.      Marland Kitchen aspirin EC 81 MG tablet Take 81 mg by mouth daily.    Marland Kitchen atorvastatin (LIPITOR) 40 MG tablet Take 40 mg by mouth daily.    Marland Kitchen CALCIUM-MAGNESIUM-ZINC PO Take 2 tablets by mouth daily.    Marland Kitchen levothyroxine (SYNTHROID, LEVOTHROID) 137 MCG tablet Take 137 mcg by mouth daily.  5  . lisinopril-hydrochlorothiazide (PRINZIDE,ZESTORETIC) 20-12.5 MG per tablet Take 1 tablet by mouth daily.      Marland Kitchen lubiprostone (AMITIZA) 24 MCG capsule Take 24 mcg by mouth at bedtime.      No current facility-administered medications for this visit.     Allergies as of 11/15/2017 - Review Complete 11/15/2017  Allergen Reaction Noted  . Sulfonamide derivatives  03/21/2006    Past Medical History:  Diagnosis Date  . Abnormal heart rhythms   . Allergic rhinitis   . Anxiety   . Arthritis   . Bronchitis   . Chest wall pain    ANTERIOR  . Depression   . Dysuria   . Eustachian tube dysfunction   . Gallstones   . Gallstones   . GERD (gastroesophageal reflux disease)   . Hyperlipidemia   . Hypertension   . Hypothyroidism   . Hypoxemia   . Incontinence   . Liver function study, abnormal   . Malaise and fatigue   . Microscopic hematuria   .  Osteopenia   . Personal history of colonic polyps   . Primary breast malignancy ([place])   . Snoring   . Tobacco abuse   . Ulcer     Past Surgical History:  Procedure Laterality Date  . BILATERAL MASTECTOMY    . COLONOSCOPY  4/07   XHB:ZJIRCVE internal hemorrhoids otherwise normal  . COLONOSCOPY  05/25/2010   LFY:BOFBPZWC hemorrhoids, otherwise normal rectum and colon.(POOR PREP). Due for surveillance 2017.   Marland Kitchen COLONOSCOPY  04/22/2002     RMR: Internal hemorrhoids, otherwise normal rectum  Polyps in the left colon resected with snare  . ESOPHAGOGASTRODUODENOSCOPY  05/25/2010   RMR:EGD distal esophageal erosions consistent with mild erosive reflux esophagitis, otherwise unremarkable esophagus status post passage of a 56-French Maloney dilator/Small hiatal  hernia/Gastric ulcer with negative H.pylori. Overdue for surveillance.   . ESOPHAGOGASTRODUODENOSCOPY N/A 03/10/2013   Dr. Gala Romney: Schatzki's ring s/p dilation, erosive reflux esophagitis, hiatal hernia, gastric erosions, negative h.pylori  . LEFT LEG SURGERY     MULTIPLE DUE TO FX  . LEFT RTC TEAR    . MALONEY DILATION N/A 03/10/2013   Procedure: Venia Minks DILATION;  Surgeon: Daneil Dolin, MD;  [facility];  Service: Endoscopy;  Laterality: N/A;  . SAVORY DILATION N/A 03/10/2013   Procedure: SAVORY DILATION;  Surgeon: Daneil Dolin, MD;  [facility];  Service: Endoscopy;  Laterality: N/A;  . TONSILECTOMY, ADENOIDECTOMY, BILATERAL MYRINGOTOMY AND TUBES    . TONSILLECTOMY    . VESICOVAGINAL FISTULA CLOSURE W/ TAH     excessive bleeding-benign    Family History  Problem Relation Age of Onset  . Colon cancer Neg Hx     Social History   Socioeconomic History  . Marital status: Single    Spouse name: Not on file  . Number of children: Not on file  . Years of education: Not on file  . Highest education level: Not on file  Occupational History  . Occupation: retired  Scientific laboratory technician  . Financial resource strain: Not on file  . Food insecurity:    Worry: Not on file    Inability: Not on file  . Transportation needs:    Medical: Not on file    Non-medical: Not on file  Tobacco Use  . Smoking status: Current Every Day Smoker    Packs/day: 0.50    Years: 25.00    Pack years: 12.50    Types: Cigarettes  . Smokeless tobacco: Never Used  . Tobacco comment: Smokes a pack of cigarettes about every 2-3 days  Substance and Sexual Activity  . Alcohol use: No  . Drug use: No  . Sexual activity: Not Currently  Lifestyle  . Physical activity:    Days per week: Not on file    Minutes per session: Not on file  . Stress: Not on file  Relationships  . Social connections:    Talks on phone: Not on file    Gets together: Not on file    Attends religious service:  Not on file    Active member of club or organization: Not on file    Attends meetings of clubs or organizations: Not on file    Relationship status: Not on file  . Intimate partner violence:    Fear of current or ex partner: Not on file    Emotionally abused: Not on file    Physically abused: Not on file    Forced sexual activity: Not on file  Other Topics Concern  .  Not on file  Social History Narrative  . Not on file      ROS:  General: Negative for anorexia, weight loss, fever, chills, fatigue, weakness. Eyes: Negative for vision changes.  ENT: Negative for hoarseness,  nasal congestion. See hpi CV: Negative for chest pain, angina, palpitations, dyspnea on exertion, peripheral edema.  Respiratory: Negative for dyspnea at rest, dyspnea on exertion, cough, sputum, wheezing.  GI: See history of present illness. GU:  Negative for dysuria, hematuria, urinary incontinence, urinary frequency, nocturnal urination.  MS: Negative for joint pain, low back pain.  Derm: Negative for rash or itching.  Neuro: Negative for weakness, abnormal sensation, seizure, frequent headaches, memory loss, confusion.  Psych: Negative for anxiety, depression, suicidal ideation, hallucinations.  Endo: Negative for unusual weight change.  Heme: Negative for bruising or bleeding. Allergy: Negative for rash or hives.    Physical Examination:  BP 126/66   Pulse 84   Temp 97.9 F (36.6 C) (Oral)   Ht 5\' 4"  (1.626 m)   Wt 203 lb (92.1 kg)   BMI 34.84 kg/m    General: Well-nourished, well-developed in no acute distress.  Head: Normocephalic, atraumatic.   Eyes: Conjunctiva pink, no icterus. Mouth: Oropharyngeal mucosa moist and pink , no lesions erythema or exudate. Neck: Supple without thyromegaly, masses, or lymphadenopathy.  Lungs: Clear to auscultation bilaterally.  Heart: Regular rate and rhythm, no murmurs rubs or gallops.  Abdomen: Bowel sounds are normal, nontender, nondistended, no  hepatosplenomegaly or masses, no abdominal bruits or    hernia , no rebound or guarding.   Rectal: not performed Extremities: No lower extremity edema. No clubbing or deformities.  Neuro: Alert and oriented x 4 , grossly normal neurologically.  Skin: Warm and dry, no rash or jaundice.   Psych: Alert and cooperative, normal mood and affect.    Imaging Studies: No results found.

## 2017-11-15 NOTE — Progress Notes (Signed)
Primary Care Physician:  Celene Squibb, MD  Primary Gastroenterologist:  Garfield Cornea, MD   Chief Complaint  Patient presents with  . Dysphagia    occ, scheduled for tcs 11/30/17  . Constipation    problems with Amitiza    HPI:  [name] is a 77 y.o. female here to discuss possibly having an upper endoscopy for dysphagia at time of her upcoming surveillance colonoscopy.  She was seen back in June.  Distant history of colon polyps, was due for surveillance in 2017.  Last colonoscopy in 2012 with poor prep.  She had a EGD in 2014 with dilation of Schatzki ring.  Also with history of known erosive esophagitis and distant history of peptic ulcer disease.  Negative H. Pylori.  Patient states that she reported no issues swallowing previously to [place] but she misunderstood questioning regarding this matter.  She went to see her hematologist and completed a survey which inquired about difficulty swallowing.  Dr. Walden Field is requesting consideration of EGD if recommended.  She complains of dysphagia to bread, meats.  Has to be very careful when eating these items or they become lodged and she has to wash it down with liquids.  The symptoms occur about 2-3 times per month.  She has a history of Schatzki ring in the past. She has a history of GERD in the past, for the most part has declined daily PPI use.     She has a history of constipation, has been on Metamucil or MiraLAX when needed in the past.  Reports sensitivity to laxatives and sometimes cause incontinence.  She was started on Amitiza 24 mcg at bedtime at her last office visit.  She feels like this is not resulting in adequate bowel movements.  Really not passing significant stool aside from a large BM about 10 days ago.  No abdominal pain.      Current Outpatient Medications  Medication Sig Dispense Refill  . acetaminophen (TYLENOL) 500 MG tablet Take 1,000 mg by mouth as needed.    Marland Kitchen amLODipine (NORVASC) 10 MG tablet Take 10 mg by mouth  daily.      Marland Kitchen aspirin EC 81 MG tablet Take 81 mg by mouth daily.    Marland Kitchen atorvastatin (LIPITOR) 40 MG tablet Take 40 mg by mouth daily.    Marland Kitchen CALCIUM-MAGNESIUM-ZINC PO Take 2 tablets by mouth daily.    Marland Kitchen levothyroxine (SYNTHROID, LEVOTHROID) 137 MCG tablet Take 137 mcg by mouth daily.  5  . lisinopril-hydrochlorothiazide (PRINZIDE,ZESTORETIC) 20-12.5 MG per tablet Take 1 tablet by mouth daily.      Marland Kitchen lubiprostone (AMITIZA) 24 MCG capsule Take 24 mcg by mouth at bedtime.      No current facility-administered medications for this visit.     Allergies as of 11/15/2017 - Review Complete 11/15/2017  Allergen Reaction Noted  . Sulfonamide derivatives  03/21/2006    Past Medical History:  Diagnosis Date  . Abnormal heart rhythms   . Allergic rhinitis   . Anxiety   . Arthritis   . Bronchitis   . Chest wall pain    ANTERIOR  . Depression   . Dysuria   . Eustachian tube dysfunction   . Gallstones   . Gallstones   . GERD (gastroesophageal reflux disease)   . Hyperlipidemia   . Hypertension   . Hypothyroidism   . Hypoxemia   . Incontinence   . Liver function study, abnormal   . Malaise and fatigue   . Microscopic hematuria   .  Osteopenia   . Personal history of colonic polyps   . Primary breast malignancy ([place])   . Snoring   . Tobacco abuse   . Ulcer     Past Surgical History:  Procedure Laterality Date  . BILATERAL MASTECTOMY    . COLONOSCOPY  4/07   JME:QASTMHD internal hemorrhoids otherwise normal  . COLONOSCOPY  05/25/2010   QQI:WLNLGXQJ hemorrhoids, otherwise normal rectum and colon.(POOR PREP). Due for surveillance 2017.   Marland Kitchen COLONOSCOPY  04/22/2002     RMR: Internal hemorrhoids, otherwise normal rectum  Polyps in the left colon resected with snare  . ESOPHAGOGASTRODUODENOSCOPY  05/25/2010   RMR:EGD distal esophageal erosions consistent with mild erosive reflux esophagitis, otherwise unremarkable esophagus status post passage of a 56-French Maloney dilator/Small hiatal  hernia/Gastric ulcer with negative H.pylori. Overdue for surveillance.   . ESOPHAGOGASTRODUODENOSCOPY N/A 03/10/2013   Dr. Gala Romney: Schatzki's ring s/p dilation, erosive reflux esophagitis, hiatal hernia, gastric erosions, negative h.pylori  . LEFT LEG SURGERY     MULTIPLE DUE TO FX  . LEFT RTC TEAR    . MALONEY DILATION N/A 03/10/2013   Procedure: Venia Minks DILATION;  Surgeon: Daneil Dolin, MD;  Location: [facility];  Service: Endoscopy;  Laterality: N/A;  . SAVORY DILATION N/A 03/10/2013   Procedure: SAVORY DILATION;  Surgeon: Daneil Dolin, MD;  Location: [facility];  Service: Endoscopy;  Laterality: N/A;  . TONSILECTOMY, ADENOIDECTOMY, BILATERAL MYRINGOTOMY AND TUBES    . TONSILLECTOMY    . VESICOVAGINAL FISTULA CLOSURE W/ TAH     excessive bleeding-benign    Family History  Problem Relation Age of Onset  . Colon cancer Neg Hx     Social History   Socioeconomic History  . Marital status: Single    Spouse name: Not on file  . Number of children: Not on file  . Years of education: Not on file  . Highest education level: Not on file  Occupational History  . Occupation: retired  Scientific laboratory technician  . Financial resource strain: Not on file  . Food insecurity:    Worry: Not on file    Inability: Not on file  . Transportation needs:    Medical: Not on file    Non-medical: Not on file  Tobacco Use  . Smoking status: Current Every Day Smoker    Packs/day: 0.50    Years: 25.00    Pack years: 12.50    Types: Cigarettes  . Smokeless tobacco: Never Used  . Tobacco comment: Smokes a pack of cigarettes about every 2-3 days  Substance and Sexual Activity  . Alcohol use: No  . Drug use: No  . Sexual activity: Not Currently  Lifestyle  . Physical activity:    Days per week: Not on file    Minutes per session: Not on file  . Stress: Not on file  Relationships  . Social connections:    Talks on phone: Not on file    Gets together: Not on file    Attends religious service:  Not on file    Active member of club or organization: Not on file    Attends meetings of clubs or organizations: Not on file    Relationship status: Not on file  . Intimate partner violence:    Fear of current or ex partner: Not on file    Emotionally abused: Not on file    Physically abused: Not on file    Forced sexual activity: Not on file  Other Topics Concern  .  Not on file  Social History Narrative  . Not on file      ROS:  General: Negative for anorexia, weight loss, fever, chills, fatigue, weakness. Eyes: Negative for vision changes.  ENT: Negative for hoarseness,  nasal congestion. See hpi CV: Negative for chest pain, angina, palpitations, dyspnea on exertion, peripheral edema.  Respiratory: Negative for dyspnea at rest, dyspnea on exertion, cough, sputum, wheezing.  GI: See history of present illness. GU:  Negative for dysuria, hematuria, urinary incontinence, urinary frequency, nocturnal urination.  MS: Negative for joint pain, low back pain.  Derm: Negative for rash or itching.  Neuro: Negative for weakness, abnormal sensation, seizure, frequent headaches, memory loss, confusion.  Psych: Negative for anxiety, depression, suicidal ideation, hallucinations.  Endo: Negative for unusual weight change.  Heme: Negative for bruising or bleeding. Allergy: Negative for rash or hives.    Physical Examination:  BP 126/66   Pulse 84   Temp 97.9 F (36.6 C) (Oral)   Ht 5\' 4"  (1.626 m)   Wt 203 lb (92.1 kg)   BMI 34.84 kg/m    General: Well-nourished, well-developed in no acute distress.  Head: Normocephalic, atraumatic.   Eyes: Conjunctiva pink, no icterus. Mouth: Oropharyngeal mucosa moist and pink , no lesions erythema or exudate. Neck: Supple without thyromegaly, masses, or lymphadenopathy.  Lungs: Clear to auscultation bilaterally.  Heart: Regular rate and rhythm, no murmurs rubs or gallops.  Abdomen: Bowel sounds are normal, nontender, nondistended, no  hepatosplenomegaly or masses, no abdominal bruits or    hernia , no rebound or guarding.   Rectal: not performed Extremities: No lower extremity edema. No clubbing or deformities.  Neuro: Alert and oriented x 4 , grossly normal neurologically.  Skin: Warm and dry, no rash or jaundice.   Psych: Alert and cooperative, normal mood and affect.    Imaging Studies: No results found.

## 2017-11-15 NOTE — Assessment & Plan Note (Signed)
Solid food dysphagia with prior history of Schatzki ring requiring dilation.  Plan for EGD with dilation at time of upcoming colonoscopy.  I have discussed the risks, alternatives, benefits with regards to but not limited to the risk of reaction to medication, bleeding, infection, perforation and the patient is agreeable to proceed. Written consent to be obtained.

## 2017-11-15 NOTE — Patient Instructions (Signed)
1. Take Amitiza 77mcg with food at breakfast and your evening meal. Call if you find your bowels are not moving well or stools are hard. If increasing Amitiza does not help, we will switch you to Linzess.  2. You will take Amitiza twice daily until the day before your colonoscopy. You will resume after your colonoscopy. HOLD for diarrhea.  3. Plan for upper endoscopy at time of your colonoscopy.

## 2017-11-15 NOTE — Assessment & Plan Note (Signed)
Colonoscopy for history of colon polyps as already scheduled.  Reminded patient of need to correct constipation prior to bowel prep for colonoscopy to make sure we get a good prep.  She will let me know if increasing Amitiza to twice daily does not help.  I have discussed the risks, alternatives, benefits with regards to but not limited to the risk of reaction to medication, bleeding, infection, perforation and the patient is agreeable to proceed. Written consent to be obtained.

## 2017-11-15 NOTE — Assessment & Plan Note (Signed)
Inadequately controlled.  Increased Amitiza to 24 mcg twice daily with food.  She will let me know if she does not get a better response.  If not we will switch her to [place] which she has done well with in the past.

## 2017-11-19 ENCOUNTER — Telehealth: Payer: Self-pay | Admitting: Internal Medicine

## 2017-11-19 MED ORDER — LINACLOTIDE 290 MCG PO CAPS: 290.0000 ug | ORAL_CAPSULE | Freq: Every day | ORAL | 5 refills | Status: DC

## 2017-11-19 NOTE — Telephone Encounter (Signed)
Spoke with pt. Pt started Amitiza 24 mcg as directed. Pt started off with one pill once daily and on Thursday, she has been taking it twice daily. She hasn't had a formed bowel movement and is concerned. Pt is only able to get out a few hard balls. Pt is asking what her next steps are due to her TCS being on 8/9.

## 2017-11-19 NOTE — Telephone Encounter (Signed)
Stop Amitiza. Start Linzess 290 mcg daily. Take on empty stomach. Can give samples and RX sent as well.

## 2017-11-19 NOTE — Telephone Encounter (Signed)
Pt notified. Samples left up front ready for pickup.

## 2017-11-19 NOTE — Addendum Note (Signed)
Addended by: Mahala Menghini on: 11/19/2017 01:37 PM   Modules accepted: Orders

## 2017-11-19 NOTE — Telephone Encounter (Signed)
[phone] please call patient, she was given medication here for constipation and she states she has been taking them but has not had a bowel movement in 2 days

## 2017-11-19 NOTE — Progress Notes (Signed)
CC'D TO PCP °

## 2017-11-21 ENCOUNTER — Telehealth: Payer: Self-pay | Admitting: Internal Medicine

## 2017-11-21 NOTE — Telephone Encounter (Signed)
Pt said that [facility] put her on Linzess samples and they are making her miserable and bloated. She doesn't know what to do. Please advise [phone]

## 2017-11-22 NOTE — Telephone Encounter (Signed)
Have her continue Linzess but today for quick relief, she should take one bottle of magnesium citrate and use one dulcolax or glycerin suppository to get things started from below.   Call tomorrow if no results. We close at noon so call early.   If significant abdominal pain or vomiting then go to ed.

## 2017-11-22 NOTE — Telephone Encounter (Signed)
Noted. Pt notified and will call with a report tomorrow before noon if she hasn't had a bowel movement.

## 2017-11-22 NOTE — Telephone Encounter (Signed)
Spoke with pt. Pt picked up Linzess 290 samples on 11/19/17 and started it on 11/20/17. Pt feels bloated and uncomfortable after eating. Pt is aware that she needs to take Linzess 290 a little longer to give it time to fully work, increase her water and fruits/ veggies. Pt also took a dose of miralax and prune juice last night. Pt says her last bowel movement was Monday and she needs relief. Pt would like to know what else she can take to help her bowels move.

## 2017-11-30 ENCOUNTER — Ambulatory Visit (HOSPITAL_COMMUNITY)
Admission: RE | Admit: 2017-11-30 | Discharge: 2017-11-30 | Disposition: A | Discharge status: Home / Self Care | Payer: Medicare HMO | Source: Ambulatory Visit | Attending: Internal Medicine | Admitting: Internal Medicine

## 2017-11-30 ENCOUNTER — Encounter (HOSPITAL_COMMUNITY)
Admission: RE | Disposition: A | Discharge status: Home / Self Care | Payer: Self-pay | Source: Ambulatory Visit | Attending: Internal Medicine

## 2017-11-30 ENCOUNTER — Encounter (HOSPITAL_COMMUNITY): Payer: Self-pay | Admitting: *Deleted

## 2017-11-30 ENCOUNTER — Other Ambulatory Visit: Payer: Self-pay

## 2017-11-30 DIAGNOSIS — K210 Gastro-esophageal reflux disease with esophagitis: Secondary | ICD-10-CM | POA: Diagnosis not present

## 2017-11-30 DIAGNOSIS — K209 Esophagitis, unspecified: Secondary | ICD-10-CM

## 2017-11-30 DIAGNOSIS — K449 Diaphragmatic hernia without obstruction or gangrene: Secondary | ICD-10-CM | POA: Diagnosis not present

## 2017-11-30 DIAGNOSIS — Z7982 Long term (current) use of aspirin: Secondary | ICD-10-CM | POA: Insufficient documentation

## 2017-11-30 DIAGNOSIS — Z9013 Acquired absence of bilateral breasts and nipples: Secondary | ICD-10-CM | POA: Diagnosis not present

## 2017-11-30 DIAGNOSIS — M8580 Other specified disorders of bone density and structure, unspecified site: Secondary | ICD-10-CM | POA: Insufficient documentation

## 2017-11-30 DIAGNOSIS — D179 Benign lipomatous neoplasm, unspecified: Secondary | ICD-10-CM | POA: Diagnosis not present

## 2017-11-30 DIAGNOSIS — F17210 Nicotine dependence, cigarettes, uncomplicated: Secondary | ICD-10-CM | POA: Diagnosis not present

## 2017-11-30 DIAGNOSIS — I10 Essential (primary) hypertension: Secondary | ICD-10-CM | POA: Insufficient documentation

## 2017-11-30 DIAGNOSIS — Z79899 Other long term (current) drug therapy: Secondary | ICD-10-CM | POA: Diagnosis not present

## 2017-11-30 DIAGNOSIS — E785 Hyperlipidemia, unspecified: Secondary | ICD-10-CM | POA: Diagnosis not present

## 2017-11-30 DIAGNOSIS — K5900 Constipation, unspecified: Secondary | ICD-10-CM | POA: Insufficient documentation

## 2017-11-30 DIAGNOSIS — R1310 Dysphagia, unspecified: Secondary | ICD-10-CM | POA: Diagnosis not present

## 2017-11-30 DIAGNOSIS — R1319 Other dysphagia: Secondary | ICD-10-CM

## 2017-11-30 DIAGNOSIS — Z853 Personal history of malignant neoplasm of breast: Secondary | ICD-10-CM | POA: Diagnosis not present

## 2017-11-30 DIAGNOSIS — K222 Esophageal obstruction: Secondary | ICD-10-CM | POA: Diagnosis not present

## 2017-11-30 DIAGNOSIS — Z86010 Personal history of colonic polyps: Secondary | ICD-10-CM | POA: Diagnosis not present

## 2017-11-30 DIAGNOSIS — E039 Hypothyroidism, unspecified: Secondary | ICD-10-CM | POA: Insufficient documentation

## 2017-11-30 DIAGNOSIS — Z1211 Encounter for screening for malignant neoplasm of colon: Secondary | ICD-10-CM | POA: Diagnosis not present

## 2017-11-30 HISTORY — PX: MALONEY DILATION: SHX5535

## 2017-11-30 HISTORY — PX: COLONOSCOPY: SHX5424

## 2017-11-30 HISTORY — PX: ESOPHAGOGASTRODUODENOSCOPY: SHX5428

## 2017-11-30 SURGERY — COLONOSCOPY
Anesthesia: Moderate Sedation

## 2017-11-30 MED ORDER — SODIUM CHLORIDE 0.9 % IV SOLN
INTRAVENOUS | Status: DC
  Administered 2017-11-30: 11:00:00 via INTRAVENOUS

## 2017-11-30 MED ORDER — MEPERIDINE HCL 50 MG/ML IJ SOLN
INTRAMUSCULAR | Status: AC
  Filled 2017-11-30: qty 1

## 2017-11-30 MED ORDER — ONDANSETRON HCL 4 MG/2ML IJ SOLN
INTRAMUSCULAR | Status: DC | PRN
  Administered 2017-11-30: 4 mg via INTRAVENOUS

## 2017-11-30 MED ORDER — LIDOCAINE VISCOUS HCL 2 % MT SOLN
OROMUCOSAL | Status: AC
  Filled 2017-11-30: qty 15

## 2017-11-30 MED ORDER — ONDANSETRON HCL 4 MG/2ML IJ SOLN
INTRAMUSCULAR | Status: AC
  Filled 2017-11-30: qty 2

## 2017-11-30 MED ORDER — MIDAZOLAM HCL 5 MG/5ML IJ SOLN
INTRAMUSCULAR | Status: DC | PRN
  Administered 2017-11-30 (×6): 1 mg via INTRAVENOUS

## 2017-11-30 MED ORDER — MIDAZOLAM HCL 5 MG/5ML IJ SOLN
INTRAMUSCULAR | Status: AC
  Filled 2017-11-30: qty 5

## 2017-11-30 MED ORDER — LIDOCAINE VISCOUS HCL 2 % MT SOLN
OROMUCOSAL | Status: DC | PRN
  Administered 2017-11-30: 1 via OROMUCOSAL

## 2017-11-30 MED ORDER — MEPERIDINE HCL 100 MG/ML IJ SOLN
INTRAMUSCULAR | Status: DC | PRN
  Administered 2017-11-30: 25 mg
  Administered 2017-11-30: 15 mg via INTRAVENOUS

## 2017-11-30 NOTE — Interval H&P Note (Signed)
History and Physical Interval Note:  11/30/2017 11:21 AM  [name]  has presented today for surgery, with the diagnosis of history polyps, dysphagia  The various methods of treatment have been discussed with the patient and family. After consideration of risks, benefits and other options for treatment, the patient has consented to  Procedure(s) with comments: COLONOSCOPY (N/A) - 12:00pm ESOPHAGOGASTRODUODENOSCOPY (EGD) (N/A) MALONEY DILATION (N/A) as a surgical intervention .  The patient's history has been reviewed, patient examined, no change in status, stable for surgery.  I have reviewed the patient's chart and labs.  Questions were answered to the patient's satisfaction.     Robert Rourk No change. EGD with ED and colonoscopy today per plan.  The risks, benefits, limitations, imponderables and alternatives regarding both EGD and colonoscopy have been reviewed with the patient. Questions have been answered. All parties agreeable.

## 2017-11-30 NOTE — Op Note (Signed)
[facility] Patient Name: [name] Procedure Date: 11/30/2017 10:54 AM MRN: [id number] Date of Birth: 1940/12/11 Attending MD: Norvel Richards , MD CSN: [id number] Age: 77 Admit Type: Outpatient Procedure:                Upper GI endoscopy Indications:              Dysphagia Providers:                Norvel Richards, MD, Janeece Riggers, RN, Aram Candela Referring MD:              Medicines:                Midazolam 3 mg IV, Meperidine 25 mg IV, Ondansetron                            4 mg IV Complications:            No immediate complications. Estimated Blood Loss:     Estimated blood loss was minimal. Procedure:                After obtaining informed consent, the endoscope was                            passed under direct vision. Throughout the                            procedure, the patient's blood pressure, pulse, and                            oxygen saturations were monitored continuously. The                            [id number]-H190 ([id number]) scope was introduced through the                            mouth, and advanced to the second part of duodenum. Scope In: 11:34:45 AM Scope Out: 11:43:41 AM Total Procedure Duration: 0 hours 8 minutes 56 seconds  Findings:      A low-grade of narrowing Schatzki ring was found at the gastroesophageal       junction.      Esophagitis was found. distal erosions within 5 mm the GE junction       noted. No tumor. No Barrett's epithelium seen.      A medium-sized hiatal hernia was present.      The exam was otherwise without abnormality.      The duodenal bulb and second portion of the duodenum were normal. The       scope was withdrawn. Dilation was performed with a Maloney dilator with       mild resistance at 43 Fr. The dilation site was examined following       endoscope reinsertion and showed no change. Ring remained intact.       Three-quarter bites of the ring were taken to disrupt with the  biopsy       forceps. This was done effectively without apparent complication.  Impression:               - Low-grade of narrowing Schatzki ring. Dilated/                            disrupted as described above.                           - mild erosive refluxEsophagitis.                           - Medium-sized hiatal hernia.                           - The examination was otherwise normal.                           - Normal duodenal bulb and second portion of the                            duodenum.                           - No specimens collected. Moderate Sedation:      Moderate (conscious) sedation was administered by the endoscopy nurse       and supervised by the endoscopist. The following parameters were       monitored: oxygen saturation, heart rate, blood pressure, respiratory       rate, EKG, adequacy of pulmonary ventilation, and response to care.       Total physician intraservice time was 19 minutes. Recommendation:           - Patient has a contact number available for                            emergencies. The signs and symptoms of potential                            delayed complications were discussed with the                            patient. Return to normal activities tomorrow.                            Written discharge instructions were provided to the                            patient.                           - Resume previous diet. (Protonix 40 mg once daily.                           - Continue present medications.                           -                           -  No repeat upper endoscopy.                           - Return to GI office in 3 months. See colonoscopy                            report. Procedure Code(s):        --- Professional ---                           [phone], Esophagogastroduodenoscopy, flexible,                            transoral; diagnostic, including collection of                            specimen(s) by brushing or  washing, when performed                            (separate procedure)                           43450, Dilation of esophagus, by unguided sound or                            bougie, single or multiple passes                           G0500, Moderate sedation services provided by the                            same physician or other qualified health care                            professional performing a gastrointestinal                            endoscopic service that sedation supports,                            requiring the presence of an independent trained                            observer to assist in the monitoring of the                            patient's level of consciousness and physiological                            status; initial 15 minutes of intra-service time;                            patient age 21 years or older (additional time 57  be reported with [phone], as appropriate) Diagnosis Code(s):        --- Professional ---                           K22.2, Esophageal obstruction                           K20.9, Esophagitis, unspecified                           K44.9, Diaphragmatic hernia without obstruction or                            gangrene                           R13.10, Dysphagia, unspecified CPT copyright 2017 [employer] Medical [employer]. All rights reserved. The codes documented in this report are preliminary and upon coder review may  be revised to meet current compliance requirements. Cristopher Estimable. Faren Florence, MD Norvel Richards, MD 11/30/2017 11:49:02 AM This report has been signed electronically. Number of Addenda: 0

## 2017-11-30 NOTE — Discharge Instructions (Signed)
EGD Discharge instructions Please read the instructions outlined below and refer to this sheet in the next few weeks. These discharge instructions provide you with general information on caring for yourself after you leave the hospital. Your doctor may also give you specific instructions. While your treatment has been planned according to the most current medical practices available, unavoidable complications occasionally occur. If you have any problems or questions after discharge, please call your doctor. ACTIVITY  You may resume your regular activity but move at a slower pace for the next 24 hours.   Take frequent rest periods for the next 24 hours.   Walking will help expel (get rid of) the air and reduce the bloated feeling in your abdomen.   No driving for 24 hours (because of the anesthesia (medicine) used during the test).   You may shower.   Do not sign any important legal documents or operate any machinery for 24 hours (because of the anesthesia used during the test).  NUTRITION  Drink plenty of fluids.   You may resume your normal diet.   Begin with a light meal and progress to your normal diet.   Avoid alcoholic beverages for 24 hours or as instructed by your caregiver.  MEDICATIONS  You may resume your normal medications unless your caregiver tells you otherwise.  WHAT YOU CAN EXPECT TODAY  You may experience abdominal discomfort such as a feeling of fullness or gas pains.  FOLLOW-UP  Your doctor will discuss the results of your test with you.  SEEK IMMEDIATE MEDICAL ATTENTION IF ANY OF THE FOLLOWING OCCUR:  Excessive nausea (feeling sick to your stomach) and/or vomiting.   Severe abdominal pain and distention (swelling).   Trouble swallowing.   Temperature over 101 F (37.8 C).   Rectal bleeding or vomiting of blood.   Colonoscopy Discharge Instructions  Read the instructions outlined below and refer to this sheet in the next few weeks. These  discharge instructions provide you with general information on caring for yourself after you leave the hospital. Your doctor may also give you specific instructions. While your treatment has been planned according to the most current medical practices available, unavoidable complications occasionally occur. If you have any problems or questions after discharge, call Dr. Gala Romney at [phone]. ACTIVITY  You may resume your regular activity, but move at a slower pace for the next 24 hours.   Take frequent rest periods for the next 24 hours.   Walking will help get rid of the air and reduce the bloated feeling in your belly (abdomen).   No driving for 24 hours (because of the medicine (anesthesia) used during the test).    Do not sign any important legal documents or operate any machinery for 24 hours (because of the anesthesia used during the test).  NUTRITION  Drink plenty of fluids.   You may resume your normal diet as instructed by your doctor.   Begin with a light meal and progress to your normal diet. Heavy or fried foods are harder to digest and may make you feel sick to your stomach (nauseated).   Avoid alcoholic beverages for 24 hours or as instructed.  MEDICATIONS  You may resume your normal medications unless your doctor tells you otherwise.  WHAT YOU CAN EXPECT TODAY  Some feelings of bloating in the abdomen.   Passage of more gas than usual.   Spotting of blood in your stool or on the toilet paper.  IF YOU HAD POLYPS REMOVED DURING THE COLONOSCOPY:  No aspirin products for 7 days or as instructed.   No alcohol for 7 days or as instructed.   Eat a soft diet for the next 24 hours.  FINDING OUT THE RESULTS OF YOUR TEST Not all test results are available during your visit. If your test results are not back during the visit, make an appointment with your caregiver to find out the results. Do not assume everything is normal if you have not heard from your caregiver or the  medical facility. It is important for you to follow up on all of your test results.  SEEK IMMEDIATE MEDICAL ATTENTION IF:  You have more than a spotting of blood in your stool.   Your belly is swollen (abdominal distention).   You are nauseated or vomiting.   You have a temperature over 101.   You have abdominal pain or discomfort that is severe or gets worse throughout the day.   No more Linzess or Amitiza   GERD and constipation information provided  Begin Protonix 40 mg daily for reflux  I do not recommend a repeat colonoscopy unless new symptoms develop  Begin Benefiber 1 tablespoon daily  Take MiraLAX 17 g orally daily in the morning. If, in the evening, if you have not had a bowel movement on a given day, take another dose of MiraLAX  Office visit with [place] in 3 months   Constipation, Adult Constipation is when a person has fewer bowel movements in a week than normal, has difficulty having a bowel movement, or has stools that are dry, hard, or larger than normal. Constipation may be caused by an underlying condition. It may become worse with age if a person takes certain medicines and does not take in enough fluids. Follow these instructions at home: Eating and drinking   Eat foods that have a lot of fiber, such as fresh fruits and vegetables, whole grains, and beans.  Limit foods that are high in fat, low in fiber, or overly processed, such as french fries, hamburgers, cookies, candies, and soda.  Drink enough fluid to keep your urine clear or pale yellow. General instructions  Exercise regularly or as told by your health care provider.  Go to the restroom when you have the urge to go. Do not hold it in.  Take over-the-counter and prescription medicines only as told by your health care provider. These include any fiber supplements.  Practice pelvic floor retraining exercises, such as deep breathing while relaxing the lower abdomen and pelvic floor relaxation  during bowel movements.  Watch your condition for any changes.  Keep all follow-up visits as told by your health care provider. This is important. Contact a health care provider if:  You have pain that gets worse.  You have a fever.  You do not have a bowel movement after 4 days.  You vomit.  You are not hungry.  You lose weight.  You are bleeding from the anus.  You have thin, pencil-like stools. Get help right away if:  You have a fever and your symptoms suddenly get worse.  You leak stool or have blood in your stool.  Your abdomen is bloated.  You have severe pain in your abdomen.  You feel dizzy or you faint. This information is not intended to replace advice given to you by your health care provider. Make sure you discuss any questions you have with your health care provider. Document Released: 01/07/2004 Document Revised: 10/29/2015 Document Reviewed: 09/29/2015 Elsevier Interactive Patient Education  2018 [place].  Gastroesophageal Reflux Disease, Adult Normally, food travels down the esophagus and stays in the stomach to be digested. However, when a person has gastroesophageal reflux disease (GERD), food and stomach acid move back up into the esophagus. When this happens, the esophagus becomes sore and inflamed. Over time, GERD can create small holes (ulcers) in the lining of the esophagus. What are the causes? This condition is caused by a problem with the muscle between the esophagus and the stomach (lower esophageal sphincter, or LES). Normally, the LES muscle closes after food passes through the esophagus to the stomach. When the LES is weakened or abnormal, it does not close properly, and that allows food and stomach acid to go back up into the esophagus. The LES can be weakened by certain dietary substances, medicines, and medical conditions, including:  Tobacco use.  Pregnancy.  Having a hiatal hernia.  Heavy alcohol use.  Certain foods and  beverages, such as coffee, chocolate, onions, and peppermint.  What increases the risk? This condition is more likely to develop in:  People who have an increased body weight.  People who have connective tissue disorders.  People who use NSAID medicines.  What are the signs or symptoms? Symptoms of this condition include:  Heartburn.  Difficult or painful swallowing.  The feeling of having a lump in the throat.  Abitter taste in the mouth.  Bad breath.  Having a large amount of saliva.  Having an upset or bloated stomach.  Belching.  Chest pain.  Shortness of breath or wheezing.  Ongoing (chronic) cough or a night-time cough.  Wearing away of tooth enamel.  Weight loss.  Different conditions can cause chest pain. Make sure to see your health care provider if you experience chest pain. How is this diagnosed? Your health care provider will take a medical history and perform a physical exam. To determine if you have mild or severe GERD, your health care provider may also monitor how you respond to treatment. You may also have other tests, including:  An endoscopy toexamine your stomach and esophagus with a small camera.  A test thatmeasures the acidity level in your esophagus.  A test thatmeasures how much pressure is on your esophagus.  A barium swallow or modified barium swallow to show the shape, size, and functioning of your esophagus.  How is this treated? The goal of treatment is to help relieve your symptoms and to prevent complications. Treatment for this condition may vary depending on how severe your symptoms are. Your health care provider may recommend:  Changes to your diet.  Medicine.  Surgery.  Follow these instructions at home: Diet  Follow a diet as recommended by your health care provider. This may involve avoiding foods and drinks such as: ? Coffee and tea (with or without caffeine). ? Drinks that containalcohol. ? Energy drinks  and sports drinks. ? Carbonated drinks or sodas. ? Chocolate and cocoa. ? Peppermint and mint flavorings. ? Garlic and onions. ? Horseradish. ? Spicy and acidic foods, including peppers, chili powder, curry powder, vinegar, hot sauces, and barbecue sauce. ? Citrus fruit juices and citrus fruits, such as oranges, lemons, and limes. ? Tomato-based foods, such as red sauce, chili, salsa, and pizza with red sauce. ? Fried and fatty foods, such as donuts, french fries, potato chips, and high-fat dressings. ? High-fat meats, such as hot dogs and fatty cuts of red and white meats, such as rib eye steak, sausage, ham, and bacon. ? High-fat dairy  items, such as whole milk, butter, and cream cheese.  Eat small, frequent meals instead of large meals.  Avoid drinking large amounts of liquid with your meals.  Avoid eating meals during the 2-3 hours before bedtime.  Avoid lying down right after you eat.  Do not exercise right after you eat. General instructions  Pay attention to any changes in your symptoms.  Take over-the-counter and prescription medicines only as told by your health care provider. Do not take aspirin, ibuprofen, or other NSAIDs unless your health care provider told you to do so.  Do not use any tobacco products, including cigarettes, chewing tobacco, and e-cigarettes. If you need help quitting, ask your health care provider.  Wear loose-fitting clothing. Do not wear anything tight around your waist that causes pressure on your abdomen.  Raise (elevate) the head of your bed 6 inches (15cm).  Try to reduce your stress, such as with yoga or meditation. If you need help reducing stress, ask your health care provider.  If you are overweight, reduce your weight to an amount that is healthy for you. Ask your health care provider for guidance about a safe weight loss goal.  Keep all follow-up visits as told by your health care provider. This is important. Contact a health care  provider if:  You have new symptoms.  You have unexplained weight loss.  You have difficulty swallowing, or it hurts to swallow.  You have wheezing or a persistent cough.  Your symptoms do not improve with treatment.  You have a hoarse voice. Get help right away if:  You have pain in your arms, neck, jaw, teeth, or back.  You feel sweaty, dizzy, or light-headed.  You have chest pain or shortness of breath.  You vomit and your vomit looks like blood or coffee grounds.  You faint.  Your stool is bloody or black.  You cannot swallow, drink, or eat. This information is not intended to replace advice given to you by your health care provider. Make sure you discuss any questions you have with your health care provider. Document Released: 01/18/2005 Document Revised: 09/08/2015 Document Reviewed: 08/05/2014 Elsevier Interactive Patient Education  [employer].

## 2017-11-30 NOTE — Op Note (Signed)
[facility] Patient Name: [name] Procedure Date: 11/30/2017 10:47 AM MRN: [id number] Date of Birth: 05-28-1940 Attending MD: Norvel Richards , MD CSN: [id number] Age: 77 Admit Type: Outpatient Procedure:                Colonoscopy Indications:              High risk colon cancer surveillance: Personal                            history of colonic polyps Providers:                Norvel Richards, MD, Janeece Riggers, RN, Aram Candela Referring MD:              Medicines:                Midazolam 6 mg IV, Meperidine 40 mg IV Complications:            No immediate complications. Estimated Blood Loss:     Estimated blood loss: none. Procedure:                Pre-Anesthesia Assessment:                           - Prior to the procedure, a History and Physical                            was performed, and patient medications and                            allergies were reviewed. The patient's tolerance of                            previous anesthesia was also reviewed. The risks                            and benefits of the procedure and the sedation                            options and risks were discussed with the patient.                            All questions were answered, and informed consent                            was obtained. Prior Anticoagulants: The patient has                            taken no previous anticoagulant or antiplatelet                            agents. ASA Grade Assessment: II - A patient with  mild systemic disease. After reviewing the risks                            and benefits, the patient was deemed in                            satisfactory condition to undergo the procedure.                           After obtaining informed consent, the colonoscope                            was passed under direct vision. Throughout the                            procedure, the patient's  blood pressure, pulse, and                            oxygen saturations were monitored continuously. The                            CF-HQ190L ([id number]) scope was introduced through                            the anus and advanced to the the cecum, identified                            by appendiceal orifice and ileocecal valve. The                            colonoscopy was performed without difficulty. The                            patient tolerated the procedure well. The quality                            of the bowel preparation was adequate. The                            ileocecal valve, appendiceal orifice, and rectum                            were photographed. The entire colon was well                            visualized. Scope In: 11:51:08 AM Scope Out: 01:09:32 PM Scope Withdrawal Time: 0 hours 6 minutes 17 seconds  Total Procedure Duration: 0 hours 13 minutes 4 seconds  Findings:      The perianal and digital rectal examinations were normal.      The colon (entire examined portion) appeared normal.      No additional abnormalities were found on retroflexion. Impression:               - The entire examined colon is normal (colonic  lipoma).                           - No specimens collected. Moderate Sedation:      Moderate (conscious) sedation was administered by the endoscopy nurse       and supervised by the endoscopist. The following parameters were       monitored: oxygen saturation, heart rate, blood pressure, respiratory       rate, EKG, adequacy of pulmonary ventilation, and response to care.       Total physician intraservice time was 49 minutes. Recommendation:           - Patient has a contact number available for                            emergencies. The signs and symptoms of potential                            delayed complications were discussed with the                            patient. Return to normal activities tomorrow.                             Written discharge instructions were provided to the                            patient.                           - Resume previous diet. I do not recommend future                            colonoscopy unless new symptoms develop. See EGD                            report.                           - Continue present medications. For constipation,                            stop Amitiza and linzess. Begin Benefiber 1                            tablespoon daily. MiraLAX 17 g orally each morning                            with an additional dose in the evening if no bowel                            movement on any given day.                           - Return to my office in 3 months. Procedure Code(s):        --- Professional ---  45378, Colonoscopy, flexible; diagnostic, including                            collection of specimen(s) by brushing or washing,                            when performed (separate procedure)                           G0500, Moderate sedation services provided by the                            same physician or other qualified health care                            professional performing a gastrointestinal                            endoscopic service that sedation supports,                            requiring the presence of an independent trained                            observer to assist in the monitoring of the                            patient's level of consciousness and physiological                            status; initial 15 minutes of intra-service time;                            patient age 78 years or older (additional time may                            be reported with [phone], as appropriate)                           [phone], Moderate sedation services provided by the                            same physician or other qualified health care                            professional performing  the diagnostic or                            therapeutic service that the sedation supports,                            requiring the presence of an independent trained                            observer  to assist in the monitoring of the                            patient's level of consciousness and physiological                            status; each additional 15 minutes intraservice                            time (List separately in addition to code for                            primary service)                           [phone], Moderate sedation services provided by the                            same physician or other qualified health care                            professional performing the diagnostic or                            therapeutic service that the sedation supports,                            requiring the presence of an independent trained                            observer to assist in the monitoring of the                            patient's level of consciousness and physiological                            status; each additional 15 minutes intraservice                            time (List separately in addition to code for                            primary service) Diagnosis Code(s):        --- Professional ---                           Z86.010, Personal history of colonic polyps CPT copyright 2017 American Medical Association. All rights reserved. The codes documented in this report are preliminary and upon coder review may  be revised to meet current compliance requirements. Cristopher Estimable. Zyere Jiminez, MD Norvel Richards, MD 11/30/2017 12:12:41 PM This report has been signed electronically. Number of Addenda: 0

## 2017-12-04 DIAGNOSIS — M816 Localized osteoporosis [Lequesne]: Secondary | ICD-10-CM | POA: Diagnosis not present

## 2017-12-04 DIAGNOSIS — R911 Solitary pulmonary nodule: Secondary | ICD-10-CM | POA: Diagnosis not present

## 2017-12-04 DIAGNOSIS — R7301 Impaired fasting glucose: Secondary | ICD-10-CM | POA: Diagnosis not present

## 2017-12-04 DIAGNOSIS — E1165 Type 2 diabetes mellitus with hyperglycemia: Secondary | ICD-10-CM | POA: Diagnosis not present

## 2017-12-04 DIAGNOSIS — M810 Age-related osteoporosis without current pathological fracture: Secondary | ICD-10-CM | POA: Diagnosis not present

## 2017-12-04 DIAGNOSIS — E782 Mixed hyperlipidemia: Secondary | ICD-10-CM | POA: Diagnosis not present

## 2017-12-04 DIAGNOSIS — E039 Hypothyroidism, unspecified: Secondary | ICD-10-CM | POA: Diagnosis not present

## 2017-12-05 ENCOUNTER — Encounter (HOSPITAL_COMMUNITY): Payer: Self-pay | Admitting: Internal Medicine

## 2017-12-05 ENCOUNTER — Telehealth: Payer: Self-pay | Admitting: Internal Medicine

## 2017-12-05 NOTE — Telephone Encounter (Signed)
Spoke with pt. She has been taken Miralax and Benefiber daily. Asked pt how much of each otc medication she has been using and she was taking Miralax once daily at night and Benefiber once daily. Discussed RMR directions for Miralax. Pt is suppose to take 17g morning and night. Pt is going to add Miralax in the morning. If pt continues to have constipation problems, she will call back.

## 2017-12-05 NOTE — Telephone Encounter (Signed)
Pt has questions about taking Miralax and Benefiber. She has been taking these since Saturday and can not have a BM. She recently had a colonoscopy. Please call her at [phone]

## 2017-12-07 IMAGING — CT CT CHEST W/O CM
2 of 6 series · 14 of 36 positions shown, 18 images · non-contrast
Comparison: Multiple exams, including 12/17/2014

CLINICAL DATA: Follow up pulmonary nodule. History of breast
cancer.

EXAM:
CT CHEST WITHOUT CONTRAST
TECHNIQUE: Multidetector CT imaging of the chest was performed following the
standard protocol without IV contrast.

[Series 2: chestroutine 5.0 b40f · axial · 0.71mm/px · z∈[-331,-16]mm · 13 of 69 slices shown, 17 images]
[im 3/69  mediastinal]
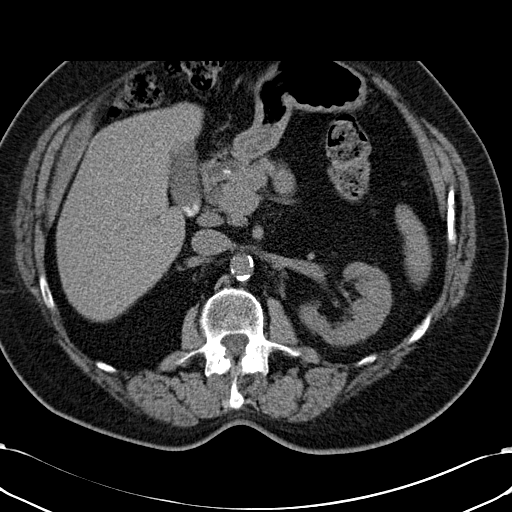
[im 3/69  lung]
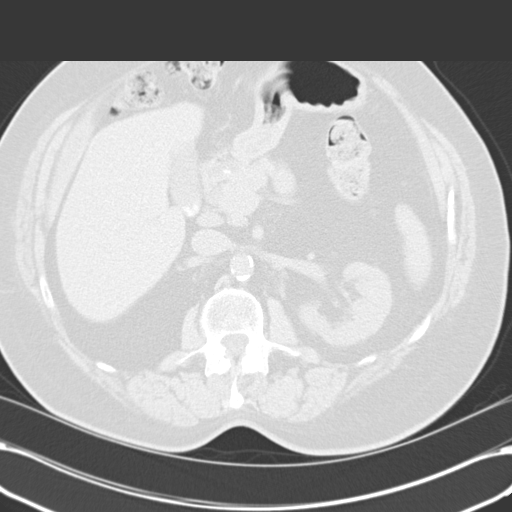
[im 9/69  lung]
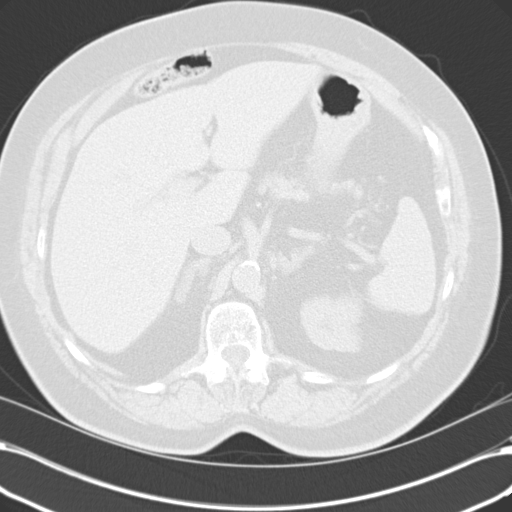
[im 15/69  lung]
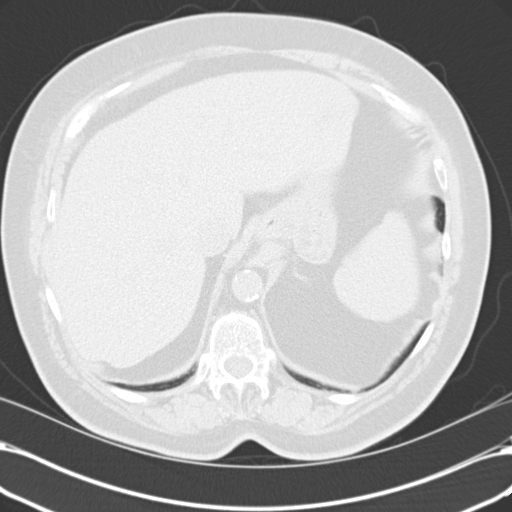
[im 18/69  lung]
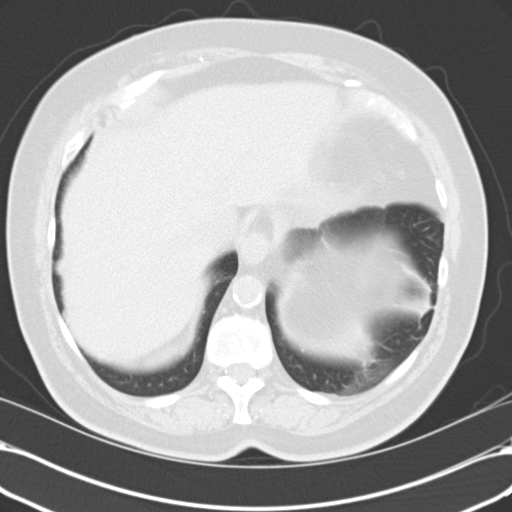
[im 24/69  mediastinal]
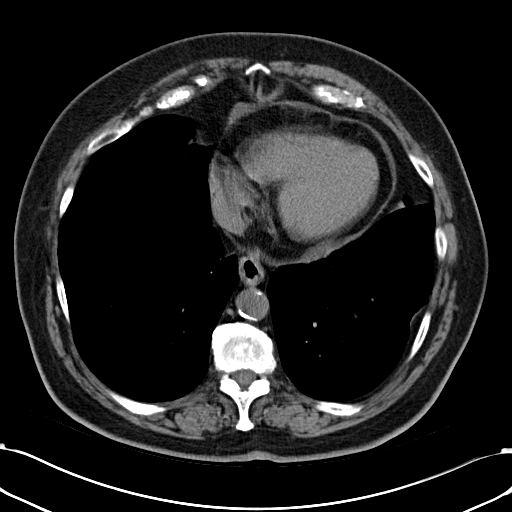
[im 24/69  lung]
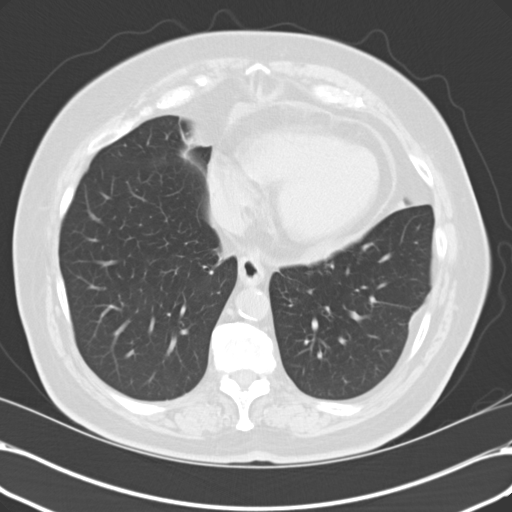
[im 30/69  lung]
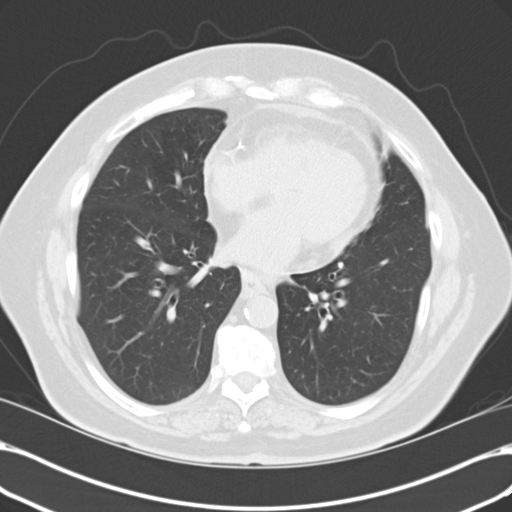
[im 36/69  lung]
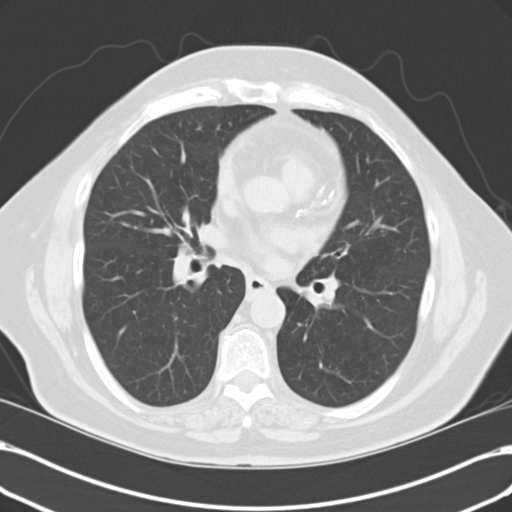
[im 39/69  lung]
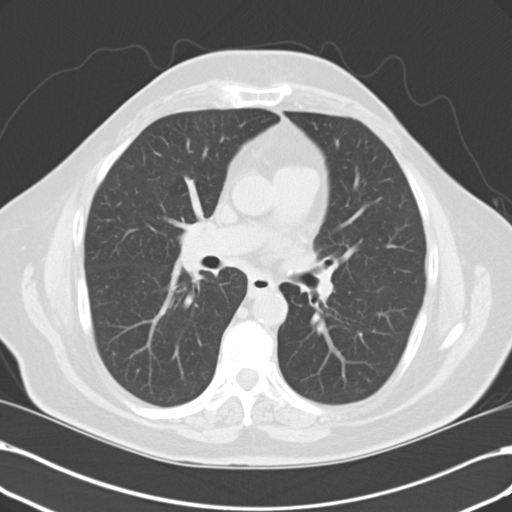
[im 45/69  mediastinal]
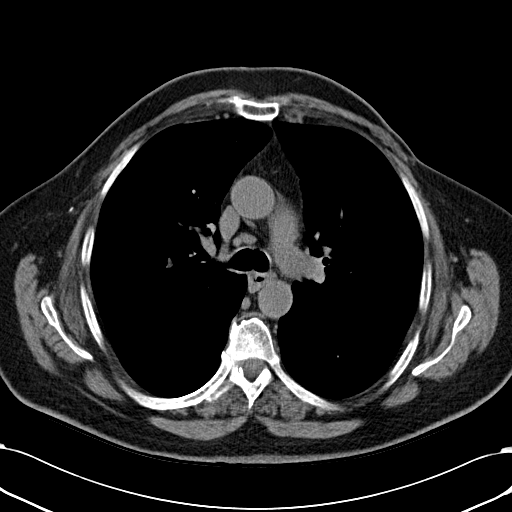
[im 45/69  lung]
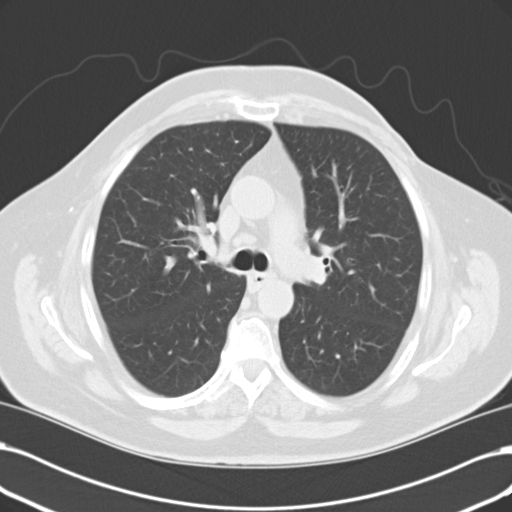
[im 51/69  lung]
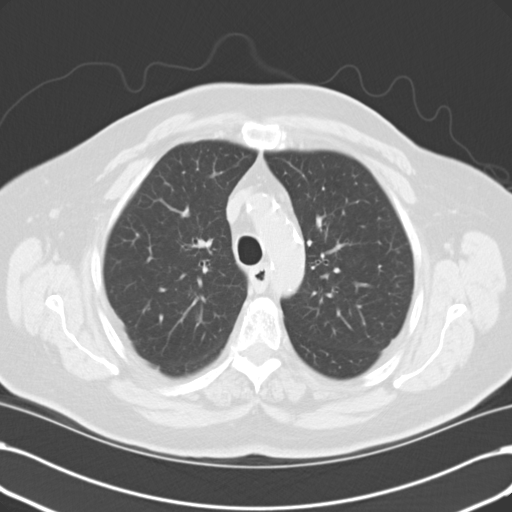
[im 54/69  lung]
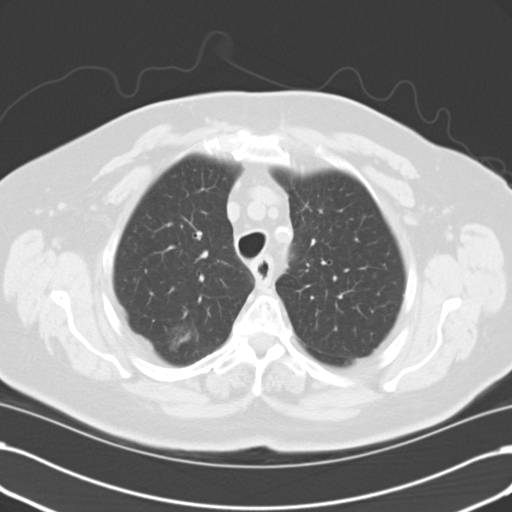
[im 60/69  lung]
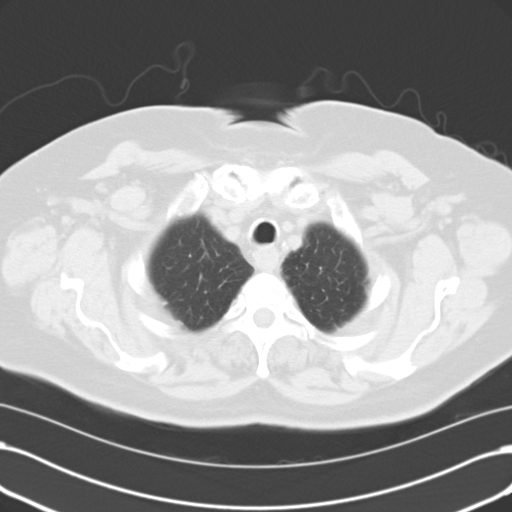
[im 66/69  mediastinal]
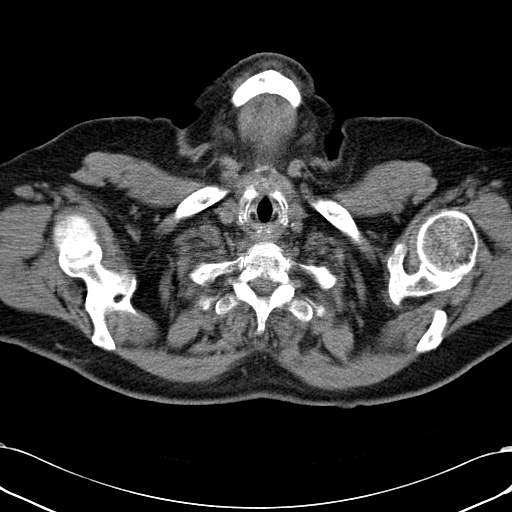
[im 66/69  lung]
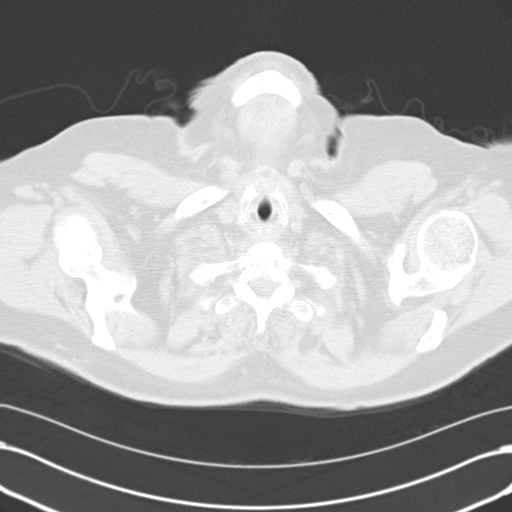

[Series 6: mpr coro 3mm · coronal · 0.71mm/px · 1 of 102 slices shown]
[im 51/102  lung]
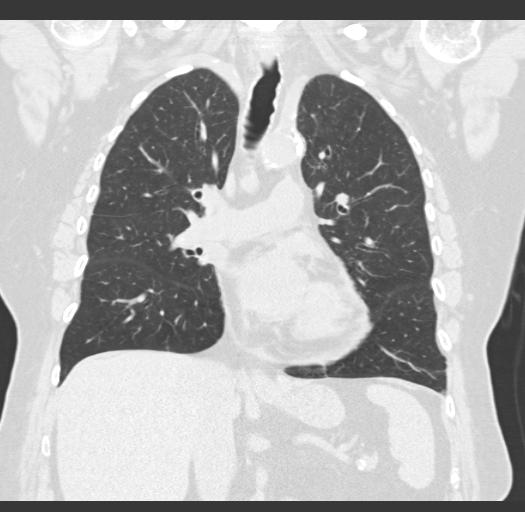

[14 of 36 positions shown; findings below may reference images not displayed]

FINDINGS: Mediastinum/Nodes: Coronary, aortic arch, and branch vessel
atherosclerotic vascular disease. Small mediastinal lymph nodes are
not pathologically enlarged by size criteria. Small pericardial
effusion, stable. Bilateral mastectomies. Stable mild diffuse
esophageal thickening.

Lungs/Pleura: Biapical pleural parenchymal scarring.

Ground-glass nodule posteriorly in the right upper lobe, 2.3 by
cm, not appreciably changed back from earliest cross-sectional
imaging of 09/09/2014.

Left upper lobe 0.9 by 0.8 cm nodule, essentially stable from
09/09/2014 as well, currently measured on image 23 series 4.

Mild scarring in the left lower lobe. No new nodule identified.
Chronic faint subpleural nodularity in both upper lobes appears
benign.

Upper abdomen: Layering small gallstones in the gallbladder. Small
splenic artery aneurysm 9 mm in diameter.

Musculoskeletal: Rotator cuff anchors in the left proximal humerus.
Thoracic spondylosis.
IMPRESSION: 1. Stable ground-glass nodule in the right upper lobe, and stable
left upper lobe pulmonary nodule at 8 mm average diameter. We have
now established 9 months of stability of both lesions. The right
upper lobe lesion warrants annual surveillance ; adapting current
guidelines for pulmonary nodule followup, and based on the 9 months
of stability, I would suggest follow up chest CT in 1 years time
unless the patient has clinical situation requires closer
observation.
2. Other imaging findings of potential clinical significance:
Coronary, aortic arch, and branch vessel atherosclerotic vascular
disease. Small stable pericardial effusion. Mild diffuse esophageal
thickening, query esophagitis. Cholelithiasis. 9 mm splenic artery
aneurysm.

## 2018-02-11 ENCOUNTER — Ambulatory Visit: Payer: Medicare HMO | Admitting: Gastroenterology

## 2018-02-11 ENCOUNTER — Encounter: Payer: Self-pay | Admitting: Gastroenterology

## 2018-02-11 ENCOUNTER — Telehealth: Payer: Self-pay | Admitting: Gastroenterology

## 2018-02-11 NOTE — Telephone Encounter (Signed)
PATIENT WAS A NO SHOW AND LETTER SENT  °

## 2018-02-26 DIAGNOSIS — R69 Illness, unspecified: Secondary | ICD-10-CM | POA: Diagnosis not present

## 2018-03-08 DIAGNOSIS — E039 Hypothyroidism, unspecified: Secondary | ICD-10-CM | POA: Diagnosis not present

## 2018-03-08 DIAGNOSIS — E1165 Type 2 diabetes mellitus with hyperglycemia: Secondary | ICD-10-CM | POA: Diagnosis not present

## 2018-03-08 DIAGNOSIS — R7301 Impaired fasting glucose: Secondary | ICD-10-CM | POA: Diagnosis not present

## 2018-03-11 DIAGNOSIS — R7301 Impaired fasting glucose: Secondary | ICD-10-CM | POA: Diagnosis not present

## 2018-03-11 DIAGNOSIS — M810 Age-related osteoporosis without current pathological fracture: Secondary | ICD-10-CM | POA: Diagnosis not present

## 2018-03-11 DIAGNOSIS — E039 Hypothyroidism, unspecified: Secondary | ICD-10-CM | POA: Diagnosis not present

## 2018-03-11 DIAGNOSIS — E782 Mixed hyperlipidemia: Secondary | ICD-10-CM | POA: Diagnosis not present

## 2018-03-11 DIAGNOSIS — E6609 Other obesity due to excess calories: Secondary | ICD-10-CM | POA: Diagnosis not present

## 2018-03-11 DIAGNOSIS — Z85828 Personal history of other malignant neoplasm of skin: Secondary | ICD-10-CM | POA: Diagnosis not present

## 2018-03-11 DIAGNOSIS — Z23 Encounter for immunization: Secondary | ICD-10-CM | POA: Diagnosis not present

## 2018-03-11 DIAGNOSIS — M25569 Pain in unspecified knee: Secondary | ICD-10-CM | POA: Diagnosis not present

## 2018-03-11 DIAGNOSIS — R911 Solitary pulmonary nodule: Secondary | ICD-10-CM | POA: Diagnosis not present

## 2018-03-11 DIAGNOSIS — Z8711 Personal history of peptic ulcer disease: Secondary | ICD-10-CM | POA: Diagnosis not present

## 2018-03-12 DIAGNOSIS — Z0000 Encounter for general adult medical examination without abnormal findings: Secondary | ICD-10-CM | POA: Diagnosis not present

## 2018-03-13 DIAGNOSIS — E1165 Type 2 diabetes mellitus with hyperglycemia: Secondary | ICD-10-CM | POA: Diagnosis not present

## 2018-05-16 DIAGNOSIS — J09X2 Influenza due to identified novel influenza A virus with other respiratory manifestations: Secondary | ICD-10-CM | POA: Diagnosis not present

## 2018-05-16 DIAGNOSIS — R0602 Shortness of breath: Secondary | ICD-10-CM | POA: Diagnosis not present

## 2018-05-29 DIAGNOSIS — R0602 Shortness of breath: Secondary | ICD-10-CM | POA: Diagnosis not present

## 2018-05-29 DIAGNOSIS — J060 Acute laryngopharyngitis: Secondary | ICD-10-CM | POA: Diagnosis not present

## 2018-08-29 DIAGNOSIS — E782 Mixed hyperlipidemia: Secondary | ICD-10-CM | POA: Diagnosis not present

## 2018-08-29 DIAGNOSIS — E039 Hypothyroidism, unspecified: Secondary | ICD-10-CM | POA: Diagnosis not present

## 2018-08-29 DIAGNOSIS — E1165 Type 2 diabetes mellitus with hyperglycemia: Secondary | ICD-10-CM | POA: Diagnosis not present

## 2018-08-29 DIAGNOSIS — R7301 Impaired fasting glucose: Secondary | ICD-10-CM | POA: Diagnosis not present

## 2018-09-04 DIAGNOSIS — J301 Allergic rhinitis due to pollen: Secondary | ICD-10-CM | POA: Diagnosis not present

## 2018-09-04 DIAGNOSIS — Z853 Personal history of malignant neoplasm of breast: Secondary | ICD-10-CM | POA: Diagnosis not present

## 2018-09-04 DIAGNOSIS — R7301 Impaired fasting glucose: Secondary | ICD-10-CM | POA: Diagnosis not present

## 2018-09-04 DIAGNOSIS — M810 Age-related osteoporosis without current pathological fracture: Secondary | ICD-10-CM | POA: Diagnosis not present

## 2018-09-04 DIAGNOSIS — R911 Solitary pulmonary nodule: Secondary | ICD-10-CM | POA: Diagnosis not present

## 2018-09-04 DIAGNOSIS — E669 Obesity, unspecified: Secondary | ICD-10-CM | POA: Diagnosis not present

## 2018-09-04 DIAGNOSIS — M25569 Pain in unspecified knee: Secondary | ICD-10-CM | POA: Diagnosis not present

## 2018-09-04 DIAGNOSIS — E782 Mixed hyperlipidemia: Secondary | ICD-10-CM | POA: Diagnosis not present

## 2018-09-04 DIAGNOSIS — E039 Hypothyroidism, unspecified: Secondary | ICD-10-CM | POA: Diagnosis not present

## 2018-09-05 DIAGNOSIS — Z0000 Encounter for general adult medical examination without abnormal findings: Secondary | ICD-10-CM | POA: Diagnosis not present

## 2018-09-20 DIAGNOSIS — R69 Illness, unspecified: Secondary | ICD-10-CM | POA: Diagnosis not present

## 2018-10-03 DIAGNOSIS — C50911 Malignant neoplasm of unspecified site of right female breast: Secondary | ICD-10-CM | POA: Diagnosis not present

## 2018-10-03 DIAGNOSIS — C50912 Malignant neoplasm of unspecified site of left female breast: Secondary | ICD-10-CM | POA: Diagnosis not present

## 2018-10-23 ENCOUNTER — Ambulatory Visit (HOSPITAL_COMMUNITY): Payer: Medicare HMO

## 2018-10-25 DIAGNOSIS — R7301 Impaired fasting glucose: Secondary | ICD-10-CM | POA: Diagnosis not present

## 2018-10-25 DIAGNOSIS — R911 Solitary pulmonary nodule: Secondary | ICD-10-CM | POA: Diagnosis not present

## 2018-10-25 DIAGNOSIS — M816 Localized osteoporosis [Lequesne]: Secondary | ICD-10-CM | POA: Diagnosis not present

## 2018-10-25 DIAGNOSIS — E039 Hypothyroidism, unspecified: Secondary | ICD-10-CM | POA: Diagnosis not present

## 2018-10-25 DIAGNOSIS — M810 Age-related osteoporosis without current pathological fracture: Secondary | ICD-10-CM | POA: Diagnosis not present

## 2018-10-25 DIAGNOSIS — E1165 Type 2 diabetes mellitus with hyperglycemia: Secondary | ICD-10-CM | POA: Diagnosis not present

## 2018-10-25 DIAGNOSIS — E782 Mixed hyperlipidemia: Secondary | ICD-10-CM | POA: Diagnosis not present

## 2018-11-04 ENCOUNTER — Other Ambulatory Visit: Payer: Self-pay

## 2018-11-04 ENCOUNTER — Other Ambulatory Visit: Payer: Medicare HMO

## 2018-11-04 DIAGNOSIS — Z20822 Contact with and (suspected) exposure to covid-19: Secondary | ICD-10-CM

## 2018-11-04 DIAGNOSIS — R6889 Other general symptoms and signs: Secondary | ICD-10-CM | POA: Diagnosis not present

## 2018-11-06 ENCOUNTER — Ambulatory Visit (HOSPITAL_COMMUNITY): Payer: Medicare HMO | Admitting: Hematology

## 2018-11-07 LAB — NOVEL CORONAVIRUS, NAA: SARS-CoV-2, NAA: NOT DETECTED

## 2018-12-10 DIAGNOSIS — E782 Mixed hyperlipidemia: Secondary | ICD-10-CM | POA: Diagnosis not present

## 2018-12-10 DIAGNOSIS — R7301 Impaired fasting glucose: Secondary | ICD-10-CM | POA: Diagnosis not present

## 2018-12-10 DIAGNOSIS — M810 Age-related osteoporosis without current pathological fracture: Secondary | ICD-10-CM | POA: Diagnosis not present

## 2018-12-10 DIAGNOSIS — E1165 Type 2 diabetes mellitus with hyperglycemia: Secondary | ICD-10-CM | POA: Diagnosis not present

## 2018-12-10 DIAGNOSIS — E039 Hypothyroidism, unspecified: Secondary | ICD-10-CM | POA: Diagnosis not present

## 2018-12-10 DIAGNOSIS — R911 Solitary pulmonary nodule: Secondary | ICD-10-CM | POA: Diagnosis not present

## 2018-12-10 DIAGNOSIS — M816 Localized osteoporosis [Lequesne]: Secondary | ICD-10-CM | POA: Diagnosis not present

## 2019-01-16 DIAGNOSIS — M810 Age-related osteoporosis without current pathological fracture: Secondary | ICD-10-CM | POA: Diagnosis not present

## 2019-01-16 DIAGNOSIS — E1165 Type 2 diabetes mellitus with hyperglycemia: Secondary | ICD-10-CM | POA: Diagnosis not present

## 2019-01-16 DIAGNOSIS — E782 Mixed hyperlipidemia: Secondary | ICD-10-CM | POA: Diagnosis not present

## 2019-01-16 DIAGNOSIS — E039 Hypothyroidism, unspecified: Secondary | ICD-10-CM | POA: Diagnosis not present

## 2019-01-16 DIAGNOSIS — R911 Solitary pulmonary nodule: Secondary | ICD-10-CM | POA: Diagnosis not present

## 2019-01-16 DIAGNOSIS — M816 Localized osteoporosis [Lequesne]: Secondary | ICD-10-CM | POA: Diagnosis not present

## 2019-01-16 DIAGNOSIS — R7301 Impaired fasting glucose: Secondary | ICD-10-CM | POA: Diagnosis not present

## 2019-01-31 DIAGNOSIS — R69 Illness, unspecified: Secondary | ICD-10-CM | POA: Diagnosis not present

## 2019-02-21 DIAGNOSIS — R7301 Impaired fasting glucose: Secondary | ICD-10-CM | POA: Diagnosis not present

## 2019-02-21 DIAGNOSIS — M816 Localized osteoporosis [Lequesne]: Secondary | ICD-10-CM | POA: Diagnosis not present

## 2019-02-21 DIAGNOSIS — E782 Mixed hyperlipidemia: Secondary | ICD-10-CM | POA: Diagnosis not present

## 2019-02-21 DIAGNOSIS — E039 Hypothyroidism, unspecified: Secondary | ICD-10-CM | POA: Diagnosis not present

## 2019-02-21 DIAGNOSIS — M810 Age-related osteoporosis without current pathological fracture: Secondary | ICD-10-CM | POA: Diagnosis not present

## 2019-02-21 DIAGNOSIS — E1165 Type 2 diabetes mellitus with hyperglycemia: Secondary | ICD-10-CM | POA: Diagnosis not present

## 2019-02-21 DIAGNOSIS — R911 Solitary pulmonary nodule: Secondary | ICD-10-CM | POA: Diagnosis not present

## 2019-02-24 DIAGNOSIS — R7301 Impaired fasting glucose: Secondary | ICD-10-CM | POA: Diagnosis not present

## 2019-02-24 DIAGNOSIS — M810 Age-related osteoporosis without current pathological fracture: Secondary | ICD-10-CM | POA: Diagnosis not present

## 2019-02-24 DIAGNOSIS — E1165 Type 2 diabetes mellitus with hyperglycemia: Secondary | ICD-10-CM | POA: Diagnosis not present

## 2019-02-24 DIAGNOSIS — R911 Solitary pulmonary nodule: Secondary | ICD-10-CM | POA: Diagnosis not present

## 2019-02-24 DIAGNOSIS — M816 Localized osteoporosis [Lequesne]: Secondary | ICD-10-CM | POA: Diagnosis not present

## 2019-02-24 DIAGNOSIS — E039 Hypothyroidism, unspecified: Secondary | ICD-10-CM | POA: Diagnosis not present

## 2019-02-24 DIAGNOSIS — E782 Mixed hyperlipidemia: Secondary | ICD-10-CM | POA: Diagnosis not present

## 2019-03-12 DIAGNOSIS — R7301 Impaired fasting glucose: Secondary | ICD-10-CM | POA: Diagnosis not present

## 2019-03-12 DIAGNOSIS — E039 Hypothyroidism, unspecified: Secondary | ICD-10-CM | POA: Diagnosis not present

## 2019-03-12 DIAGNOSIS — E782 Mixed hyperlipidemia: Secondary | ICD-10-CM | POA: Diagnosis not present

## 2019-03-12 DIAGNOSIS — E1165 Type 2 diabetes mellitus with hyperglycemia: Secondary | ICD-10-CM | POA: Diagnosis not present

## 2019-03-12 DIAGNOSIS — R7303 Prediabetes: Secondary | ICD-10-CM | POA: Diagnosis not present

## 2019-03-17 DIAGNOSIS — J301 Allergic rhinitis due to pollen: Secondary | ICD-10-CM | POA: Diagnosis not present

## 2019-03-17 DIAGNOSIS — R7301 Impaired fasting glucose: Secondary | ICD-10-CM | POA: Diagnosis not present

## 2019-03-17 DIAGNOSIS — Z853 Personal history of malignant neoplasm of breast: Secondary | ICD-10-CM | POA: Diagnosis not present

## 2019-03-17 DIAGNOSIS — E782 Mixed hyperlipidemia: Secondary | ICD-10-CM | POA: Diagnosis not present

## 2019-03-17 DIAGNOSIS — Z8711 Personal history of peptic ulcer disease: Secondary | ICD-10-CM | POA: Diagnosis not present

## 2019-03-17 DIAGNOSIS — M25569 Pain in unspecified knee: Secondary | ICD-10-CM | POA: Diagnosis not present

## 2019-03-17 DIAGNOSIS — R911 Solitary pulmonary nodule: Secondary | ICD-10-CM | POA: Diagnosis not present

## 2019-03-17 DIAGNOSIS — M810 Age-related osteoporosis without current pathological fracture: Secondary | ICD-10-CM | POA: Diagnosis not present

## 2019-03-17 DIAGNOSIS — E039 Hypothyroidism, unspecified: Secondary | ICD-10-CM | POA: Diagnosis not present

## 2019-04-03 DIAGNOSIS — M810 Age-related osteoporosis without current pathological fracture: Secondary | ICD-10-CM | POA: Diagnosis not present

## 2019-04-03 DIAGNOSIS — E039 Hypothyroidism, unspecified: Secondary | ICD-10-CM | POA: Diagnosis not present

## 2019-04-03 DIAGNOSIS — R911 Solitary pulmonary nodule: Secondary | ICD-10-CM | POA: Diagnosis not present

## 2019-04-03 DIAGNOSIS — J301 Allergic rhinitis due to pollen: Secondary | ICD-10-CM | POA: Diagnosis not present

## 2019-04-03 DIAGNOSIS — Z8711 Personal history of peptic ulcer disease: Secondary | ICD-10-CM | POA: Diagnosis not present

## 2019-04-03 DIAGNOSIS — Z853 Personal history of malignant neoplasm of breast: Secondary | ICD-10-CM | POA: Diagnosis not present

## 2019-04-03 DIAGNOSIS — R7301 Impaired fasting glucose: Secondary | ICD-10-CM | POA: Diagnosis not present

## 2019-04-03 DIAGNOSIS — E782 Mixed hyperlipidemia: Secondary | ICD-10-CM | POA: Diagnosis not present

## 2019-04-03 DIAGNOSIS — M25569 Pain in unspecified knee: Secondary | ICD-10-CM | POA: Diagnosis not present

## 2019-05-06 DIAGNOSIS — R69 Illness, unspecified: Secondary | ICD-10-CM | POA: Diagnosis not present

## 2019-05-14 DIAGNOSIS — E782 Mixed hyperlipidemia: Secondary | ICD-10-CM | POA: Diagnosis not present

## 2019-05-14 DIAGNOSIS — E039 Hypothyroidism, unspecified: Secondary | ICD-10-CM | POA: Diagnosis not present

## 2019-05-14 DIAGNOSIS — Z8711 Personal history of peptic ulcer disease: Secondary | ICD-10-CM | POA: Diagnosis not present

## 2019-05-14 DIAGNOSIS — Z853 Personal history of malignant neoplasm of breast: Secondary | ICD-10-CM | POA: Diagnosis not present

## 2019-05-14 DIAGNOSIS — R7301 Impaired fasting glucose: Secondary | ICD-10-CM | POA: Diagnosis not present

## 2019-05-14 DIAGNOSIS — M810 Age-related osteoporosis without current pathological fracture: Secondary | ICD-10-CM | POA: Diagnosis not present

## 2019-05-14 DIAGNOSIS — M25569 Pain in unspecified knee: Secondary | ICD-10-CM | POA: Diagnosis not present

## 2019-05-14 DIAGNOSIS — J301 Allergic rhinitis due to pollen: Secondary | ICD-10-CM | POA: Diagnosis not present

## 2019-05-14 DIAGNOSIS — R911 Solitary pulmonary nodule: Secondary | ICD-10-CM | POA: Diagnosis not present

## 2019-06-10 DIAGNOSIS — M810 Age-related osteoporosis without current pathological fracture: Secondary | ICD-10-CM | POA: Diagnosis not present

## 2019-06-10 DIAGNOSIS — E039 Hypothyroidism, unspecified: Secondary | ICD-10-CM | POA: Diagnosis not present

## 2019-06-10 DIAGNOSIS — E782 Mixed hyperlipidemia: Secondary | ICD-10-CM | POA: Diagnosis not present

## 2019-06-10 DIAGNOSIS — M816 Localized osteoporosis [Lequesne]: Secondary | ICD-10-CM | POA: Diagnosis not present

## 2019-06-10 DIAGNOSIS — R911 Solitary pulmonary nodule: Secondary | ICD-10-CM | POA: Diagnosis not present

## 2019-06-10 DIAGNOSIS — E1165 Type 2 diabetes mellitus with hyperglycemia: Secondary | ICD-10-CM | POA: Diagnosis not present

## 2019-06-10 DIAGNOSIS — R7301 Impaired fasting glucose: Secondary | ICD-10-CM | POA: Diagnosis not present

## 2019-07-23 DIAGNOSIS — R7301 Impaired fasting glucose: Secondary | ICD-10-CM | POA: Diagnosis not present

## 2019-07-23 DIAGNOSIS — E039 Hypothyroidism, unspecified: Secondary | ICD-10-CM | POA: Diagnosis not present

## 2019-07-23 DIAGNOSIS — E1165 Type 2 diabetes mellitus with hyperglycemia: Secondary | ICD-10-CM | POA: Diagnosis not present

## 2019-07-23 DIAGNOSIS — R911 Solitary pulmonary nodule: Secondary | ICD-10-CM | POA: Diagnosis not present

## 2019-07-23 DIAGNOSIS — M816 Localized osteoporosis [Lequesne]: Secondary | ICD-10-CM | POA: Diagnosis not present

## 2019-07-23 DIAGNOSIS — E782 Mixed hyperlipidemia: Secondary | ICD-10-CM | POA: Diagnosis not present

## 2019-07-23 DIAGNOSIS — M810 Age-related osteoporosis without current pathological fracture: Secondary | ICD-10-CM | POA: Diagnosis not present

## 2019-07-30 DIAGNOSIS — L814 Other melanin hyperpigmentation: Secondary | ICD-10-CM | POA: Diagnosis not present

## 2019-07-30 DIAGNOSIS — L820 Inflamed seborrheic keratosis: Secondary | ICD-10-CM | POA: Diagnosis not present

## 2019-07-30 DIAGNOSIS — L570 Actinic keratosis: Secondary | ICD-10-CM | POA: Diagnosis not present

## 2019-07-30 DIAGNOSIS — D1801 Hemangioma of skin and subcutaneous tissue: Secondary | ICD-10-CM | POA: Diagnosis not present

## 2019-07-30 DIAGNOSIS — L821 Other seborrheic keratosis: Secondary | ICD-10-CM | POA: Diagnosis not present

## 2019-07-30 DIAGNOSIS — Z85828 Personal history of other malignant neoplasm of skin: Secondary | ICD-10-CM | POA: Diagnosis not present

## 2019-07-30 DIAGNOSIS — D0439 Carcinoma in situ of skin of other parts of face: Secondary | ICD-10-CM | POA: Diagnosis not present

## 2019-07-30 DIAGNOSIS — L720 Epidermal cyst: Secondary | ICD-10-CM | POA: Diagnosis not present

## 2019-08-08 DIAGNOSIS — M816 Localized osteoporosis [Lequesne]: Secondary | ICD-10-CM | POA: Diagnosis not present

## 2019-08-08 DIAGNOSIS — R7301 Impaired fasting glucose: Secondary | ICD-10-CM | POA: Diagnosis not present

## 2019-08-08 DIAGNOSIS — E782 Mixed hyperlipidemia: Secondary | ICD-10-CM | POA: Diagnosis not present

## 2019-08-08 DIAGNOSIS — E039 Hypothyroidism, unspecified: Secondary | ICD-10-CM | POA: Diagnosis not present

## 2019-08-08 DIAGNOSIS — E1165 Type 2 diabetes mellitus with hyperglycemia: Secondary | ICD-10-CM | POA: Diagnosis not present

## 2019-08-08 DIAGNOSIS — R911 Solitary pulmonary nodule: Secondary | ICD-10-CM | POA: Diagnosis not present

## 2019-08-08 DIAGNOSIS — M810 Age-related osteoporosis without current pathological fracture: Secondary | ICD-10-CM | POA: Diagnosis not present

## 2019-09-09 DIAGNOSIS — R911 Solitary pulmonary nodule: Secondary | ICD-10-CM | POA: Diagnosis not present

## 2019-09-09 DIAGNOSIS — E039 Hypothyroidism, unspecified: Secondary | ICD-10-CM | POA: Diagnosis not present

## 2019-09-09 DIAGNOSIS — E782 Mixed hyperlipidemia: Secondary | ICD-10-CM | POA: Diagnosis not present

## 2019-09-09 DIAGNOSIS — M816 Localized osteoporosis [Lequesne]: Secondary | ICD-10-CM | POA: Diagnosis not present

## 2019-09-09 DIAGNOSIS — M810 Age-related osteoporosis without current pathological fracture: Secondary | ICD-10-CM | POA: Diagnosis not present

## 2019-09-09 DIAGNOSIS — E1165 Type 2 diabetes mellitus with hyperglycemia: Secondary | ICD-10-CM | POA: Diagnosis not present

## 2019-09-09 DIAGNOSIS — R7301 Impaired fasting glucose: Secondary | ICD-10-CM | POA: Diagnosis not present

## 2019-10-09 DIAGNOSIS — J301 Allergic rhinitis due to pollen: Secondary | ICD-10-CM | POA: Diagnosis not present

## 2019-10-09 DIAGNOSIS — E1165 Type 2 diabetes mellitus with hyperglycemia: Secondary | ICD-10-CM | POA: Diagnosis not present

## 2019-10-09 DIAGNOSIS — E782 Mixed hyperlipidemia: Secondary | ICD-10-CM | POA: Diagnosis not present

## 2019-10-09 DIAGNOSIS — E039 Hypothyroidism, unspecified: Secondary | ICD-10-CM | POA: Diagnosis not present

## 2019-10-09 DIAGNOSIS — J060 Acute laryngopharyngitis: Secondary | ICD-10-CM | POA: Diagnosis not present

## 2019-10-09 DIAGNOSIS — J018 Other acute sinusitis: Secondary | ICD-10-CM | POA: Diagnosis not present

## 2019-10-09 DIAGNOSIS — H6121 Impacted cerumen, right ear: Secondary | ICD-10-CM | POA: Diagnosis not present

## 2019-10-09 DIAGNOSIS — C50919 Malignant neoplasm of unspecified site of unspecified female breast: Secondary | ICD-10-CM | POA: Diagnosis not present

## 2019-10-09 DIAGNOSIS — E669 Obesity, unspecified: Secondary | ICD-10-CM | POA: Diagnosis not present

## 2019-10-10 DIAGNOSIS — Z853 Personal history of malignant neoplasm of breast: Secondary | ICD-10-CM | POA: Diagnosis not present

## 2019-10-10 DIAGNOSIS — R7301 Impaired fasting glucose: Secondary | ICD-10-CM | POA: Diagnosis not present

## 2019-10-10 DIAGNOSIS — E782 Mixed hyperlipidemia: Secondary | ICD-10-CM | POA: Diagnosis not present

## 2019-10-10 DIAGNOSIS — M810 Age-related osteoporosis without current pathological fracture: Secondary | ICD-10-CM | POA: Diagnosis not present

## 2019-10-10 DIAGNOSIS — E039 Hypothyroidism, unspecified: Secondary | ICD-10-CM | POA: Diagnosis not present

## 2019-10-10 DIAGNOSIS — Z8711 Personal history of peptic ulcer disease: Secondary | ICD-10-CM | POA: Diagnosis not present

## 2019-10-10 DIAGNOSIS — R911 Solitary pulmonary nodule: Secondary | ICD-10-CM | POA: Diagnosis not present

## 2019-10-10 DIAGNOSIS — M25569 Pain in unspecified knee: Secondary | ICD-10-CM | POA: Diagnosis not present

## 2019-10-10 DIAGNOSIS — J301 Allergic rhinitis due to pollen: Secondary | ICD-10-CM | POA: Diagnosis not present

## 2019-10-22 DIAGNOSIS — M25569 Pain in unspecified knee: Secondary | ICD-10-CM | POA: Diagnosis not present

## 2019-10-22 DIAGNOSIS — J301 Allergic rhinitis due to pollen: Secondary | ICD-10-CM | POA: Diagnosis not present

## 2019-10-22 DIAGNOSIS — M810 Age-related osteoporosis without current pathological fracture: Secondary | ICD-10-CM | POA: Diagnosis not present

## 2019-10-22 DIAGNOSIS — R911 Solitary pulmonary nodule: Secondary | ICD-10-CM | POA: Diagnosis not present

## 2019-10-22 DIAGNOSIS — R7301 Impaired fasting glucose: Secondary | ICD-10-CM | POA: Diagnosis not present

## 2019-10-22 DIAGNOSIS — E782 Mixed hyperlipidemia: Secondary | ICD-10-CM | POA: Diagnosis not present

## 2019-10-22 DIAGNOSIS — E039 Hypothyroidism, unspecified: Secondary | ICD-10-CM | POA: Diagnosis not present

## 2019-10-22 DIAGNOSIS — Z853 Personal history of malignant neoplasm of breast: Secondary | ICD-10-CM | POA: Diagnosis not present

## 2019-10-22 DIAGNOSIS — I10 Essential (primary) hypertension: Secondary | ICD-10-CM | POA: Diagnosis not present

## 2019-11-06 DIAGNOSIS — E782 Mixed hyperlipidemia: Secondary | ICD-10-CM | POA: Diagnosis not present

## 2019-11-06 DIAGNOSIS — E039 Hypothyroidism, unspecified: Secondary | ICD-10-CM | POA: Diagnosis not present

## 2019-11-06 DIAGNOSIS — R911 Solitary pulmonary nodule: Secondary | ICD-10-CM | POA: Diagnosis not present

## 2019-11-06 DIAGNOSIS — R7301 Impaired fasting glucose: Secondary | ICD-10-CM | POA: Diagnosis not present

## 2019-11-06 DIAGNOSIS — M25569 Pain in unspecified knee: Secondary | ICD-10-CM | POA: Diagnosis not present

## 2019-11-06 DIAGNOSIS — I10 Essential (primary) hypertension: Secondary | ICD-10-CM | POA: Diagnosis not present

## 2019-11-06 DIAGNOSIS — J301 Allergic rhinitis due to pollen: Secondary | ICD-10-CM | POA: Diagnosis not present

## 2019-11-06 DIAGNOSIS — M810 Age-related osteoporosis without current pathological fracture: Secondary | ICD-10-CM | POA: Diagnosis not present

## 2019-11-06 DIAGNOSIS — Z853 Personal history of malignant neoplasm of breast: Secondary | ICD-10-CM | POA: Diagnosis not present

## 2019-11-21 DIAGNOSIS — E669 Obesity, unspecified: Secondary | ICD-10-CM | POA: Diagnosis not present

## 2019-11-21 DIAGNOSIS — K219 Gastro-esophageal reflux disease without esophagitis: Secondary | ICD-10-CM | POA: Diagnosis not present

## 2019-11-21 DIAGNOSIS — G8929 Other chronic pain: Secondary | ICD-10-CM | POA: Diagnosis not present

## 2019-11-21 DIAGNOSIS — E785 Hyperlipidemia, unspecified: Secondary | ICD-10-CM | POA: Diagnosis not present

## 2019-11-21 DIAGNOSIS — Z008 Encounter for other general examination: Secondary | ICD-10-CM | POA: Diagnosis not present

## 2019-11-21 DIAGNOSIS — J301 Allergic rhinitis due to pollen: Secondary | ICD-10-CM | POA: Diagnosis not present

## 2019-11-21 DIAGNOSIS — I10 Essential (primary) hypertension: Secondary | ICD-10-CM | POA: Diagnosis not present

## 2019-11-21 DIAGNOSIS — K5900 Constipation, unspecified: Secondary | ICD-10-CM | POA: Diagnosis not present

## 2019-11-21 DIAGNOSIS — M1990 Unspecified osteoarthritis, unspecified site: Secondary | ICD-10-CM | POA: Diagnosis not present

## 2019-11-21 DIAGNOSIS — E039 Hypothyroidism, unspecified: Secondary | ICD-10-CM | POA: Diagnosis not present

## 2019-11-21 DIAGNOSIS — Z6832 Body mass index (BMI) 32.0-32.9, adult: Secondary | ICD-10-CM | POA: Diagnosis not present

## 2019-11-25 DIAGNOSIS — M25562 Pain in left knee: Secondary | ICD-10-CM | POA: Diagnosis not present

## 2019-11-25 DIAGNOSIS — M1712 Unilateral primary osteoarthritis, left knee: Secondary | ICD-10-CM | POA: Diagnosis not present

## 2019-12-05 ENCOUNTER — Encounter: Payer: Self-pay | Admitting: Genetic Counselor

## 2019-12-18 DIAGNOSIS — R69 Illness, unspecified: Secondary | ICD-10-CM | POA: Diagnosis not present

## 2019-12-23 DIAGNOSIS — M25569 Pain in unspecified knee: Secondary | ICD-10-CM | POA: Diagnosis not present

## 2019-12-23 DIAGNOSIS — E782 Mixed hyperlipidemia: Secondary | ICD-10-CM | POA: Diagnosis not present

## 2019-12-23 DIAGNOSIS — I10 Essential (primary) hypertension: Secondary | ICD-10-CM | POA: Diagnosis not present

## 2019-12-23 DIAGNOSIS — M810 Age-related osteoporosis without current pathological fracture: Secondary | ICD-10-CM | POA: Diagnosis not present

## 2019-12-23 DIAGNOSIS — Z853 Personal history of malignant neoplasm of breast: Secondary | ICD-10-CM | POA: Diagnosis not present

## 2019-12-23 DIAGNOSIS — E039 Hypothyroidism, unspecified: Secondary | ICD-10-CM | POA: Diagnosis not present

## 2019-12-23 DIAGNOSIS — R911 Solitary pulmonary nodule: Secondary | ICD-10-CM | POA: Diagnosis not present

## 2019-12-23 DIAGNOSIS — R7301 Impaired fasting glucose: Secondary | ICD-10-CM | POA: Diagnosis not present

## 2019-12-23 DIAGNOSIS — J301 Allergic rhinitis due to pollen: Secondary | ICD-10-CM | POA: Diagnosis not present

## 2020-02-05 DIAGNOSIS — E782 Mixed hyperlipidemia: Secondary | ICD-10-CM | POA: Diagnosis not present

## 2020-02-05 DIAGNOSIS — M25569 Pain in unspecified knee: Secondary | ICD-10-CM | POA: Diagnosis not present

## 2020-02-05 DIAGNOSIS — Z853 Personal history of malignant neoplasm of breast: Secondary | ICD-10-CM | POA: Diagnosis not present

## 2020-02-05 DIAGNOSIS — I10 Essential (primary) hypertension: Secondary | ICD-10-CM | POA: Diagnosis not present

## 2020-02-05 DIAGNOSIS — R911 Solitary pulmonary nodule: Secondary | ICD-10-CM | POA: Diagnosis not present

## 2020-02-05 DIAGNOSIS — M810 Age-related osteoporosis without current pathological fracture: Secondary | ICD-10-CM | POA: Diagnosis not present

## 2020-02-05 DIAGNOSIS — R7301 Impaired fasting glucose: Secondary | ICD-10-CM | POA: Diagnosis not present

## 2020-02-05 DIAGNOSIS — E039 Hypothyroidism, unspecified: Secondary | ICD-10-CM | POA: Diagnosis not present

## 2020-02-05 DIAGNOSIS — J301 Allergic rhinitis due to pollen: Secondary | ICD-10-CM | POA: Diagnosis not present

## 2020-02-12 DIAGNOSIS — M1712 Unilateral primary osteoarthritis, left knee: Secondary | ICD-10-CM | POA: Diagnosis not present

## 2020-02-12 DIAGNOSIS — M25562 Pain in left knee: Secondary | ICD-10-CM | POA: Diagnosis not present

## 2020-03-01 DIAGNOSIS — H43813 Vitreous degeneration, bilateral: Secondary | ICD-10-CM | POA: Diagnosis not present

## 2020-03-04 ENCOUNTER — Ambulatory Visit: Payer: Medicare HMO | Attending: Internal Medicine

## 2020-03-04 DIAGNOSIS — Z23 Encounter for immunization: Secondary | ICD-10-CM

## 2020-03-04 NOTE — Progress Notes (Signed)
   Covid-19 Vaccination Clinic  Name:  [name]    MRN: [id number] DOB: [birth year]  03/04/2020  [name] was observed post Covid-19 immunization for 15 minutes without incident. She was provided with Vaccine Information Sheet and instruction to access the V-Safe system.   [name] was instructed to call 911 with any severe reactions post vaccine: Marland Kitchen Difficulty breathing  . Swelling of face and throat  . A fast heartbeat  . A bad rash all over body  . Dizziness and weakness

## 2020-03-11 DIAGNOSIS — J301 Allergic rhinitis due to pollen: Secondary | ICD-10-CM | POA: Diagnosis not present

## 2020-03-11 DIAGNOSIS — Z853 Personal history of malignant neoplasm of breast: Secondary | ICD-10-CM | POA: Diagnosis not present

## 2020-03-11 DIAGNOSIS — R7301 Impaired fasting glucose: Secondary | ICD-10-CM | POA: Diagnosis not present

## 2020-03-11 DIAGNOSIS — I10 Essential (primary) hypertension: Secondary | ICD-10-CM | POA: Diagnosis not present

## 2020-03-11 DIAGNOSIS — E782 Mixed hyperlipidemia: Secondary | ICD-10-CM | POA: Diagnosis not present

## 2020-03-11 DIAGNOSIS — E039 Hypothyroidism, unspecified: Secondary | ICD-10-CM | POA: Diagnosis not present

## 2020-03-11 DIAGNOSIS — R911 Solitary pulmonary nodule: Secondary | ICD-10-CM | POA: Diagnosis not present

## 2020-03-11 DIAGNOSIS — M810 Age-related osteoporosis without current pathological fracture: Secondary | ICD-10-CM | POA: Diagnosis not present

## 2020-03-11 DIAGNOSIS — M25569 Pain in unspecified knee: Secondary | ICD-10-CM | POA: Diagnosis not present

## 2020-03-25 DIAGNOSIS — J302 Other seasonal allergic rhinitis: Secondary | ICD-10-CM | POA: Diagnosis not present

## 2020-03-25 DIAGNOSIS — J019 Acute sinusitis, unspecified: Secondary | ICD-10-CM | POA: Diagnosis not present

## 2020-04-08 DIAGNOSIS — R7301 Impaired fasting glucose: Secondary | ICD-10-CM | POA: Diagnosis not present

## 2020-04-08 DIAGNOSIS — M810 Age-related osteoporosis without current pathological fracture: Secondary | ICD-10-CM | POA: Diagnosis not present

## 2020-04-08 DIAGNOSIS — R911 Solitary pulmonary nodule: Secondary | ICD-10-CM | POA: Diagnosis not present

## 2020-04-08 DIAGNOSIS — E1165 Type 2 diabetes mellitus with hyperglycemia: Secondary | ICD-10-CM | POA: Diagnosis not present

## 2020-04-08 DIAGNOSIS — E782 Mixed hyperlipidemia: Secondary | ICD-10-CM | POA: Diagnosis not present

## 2020-04-08 DIAGNOSIS — R7303 Prediabetes: Secondary | ICD-10-CM | POA: Diagnosis not present

## 2020-04-08 DIAGNOSIS — E039 Hypothyroidism, unspecified: Secondary | ICD-10-CM | POA: Diagnosis not present

## 2020-04-08 DIAGNOSIS — I10 Essential (primary) hypertension: Secondary | ICD-10-CM | POA: Diagnosis not present

## 2020-04-08 DIAGNOSIS — Z853 Personal history of malignant neoplasm of breast: Secondary | ICD-10-CM | POA: Diagnosis not present

## 2020-04-08 DIAGNOSIS — L03312 Cellulitis of back [any part except buttock]: Secondary | ICD-10-CM | POA: Diagnosis not present

## 2020-04-12 DIAGNOSIS — Z0001 Encounter for general adult medical examination with abnormal findings: Secondary | ICD-10-CM | POA: Diagnosis not present

## 2020-04-12 DIAGNOSIS — R911 Solitary pulmonary nodule: Secondary | ICD-10-CM | POA: Diagnosis not present

## 2020-04-12 DIAGNOSIS — J301 Allergic rhinitis due to pollen: Secondary | ICD-10-CM | POA: Diagnosis not present

## 2020-04-12 DIAGNOSIS — Z23 Encounter for immunization: Secondary | ICD-10-CM | POA: Diagnosis not present

## 2020-04-12 DIAGNOSIS — M810 Age-related osteoporosis without current pathological fracture: Secondary | ICD-10-CM | POA: Diagnosis not present

## 2020-04-12 DIAGNOSIS — Z853 Personal history of malignant neoplasm of breast: Secondary | ICD-10-CM | POA: Diagnosis not present

## 2020-04-12 DIAGNOSIS — M25569 Pain in unspecified knee: Secondary | ICD-10-CM | POA: Diagnosis not present

## 2020-04-12 DIAGNOSIS — E782 Mixed hyperlipidemia: Secondary | ICD-10-CM | POA: Diagnosis not present

## 2020-04-12 DIAGNOSIS — E039 Hypothyroidism, unspecified: Secondary | ICD-10-CM | POA: Diagnosis not present

## 2020-04-12 DIAGNOSIS — R7301 Impaired fasting glucose: Secondary | ICD-10-CM | POA: Diagnosis not present

## 2020-04-23 DIAGNOSIS — R911 Solitary pulmonary nodule: Secondary | ICD-10-CM | POA: Diagnosis not present

## 2020-04-23 DIAGNOSIS — E782 Mixed hyperlipidemia: Secondary | ICD-10-CM | POA: Diagnosis not present

## 2020-04-23 DIAGNOSIS — R7301 Impaired fasting glucose: Secondary | ICD-10-CM | POA: Diagnosis not present

## 2020-04-23 DIAGNOSIS — J301 Allergic rhinitis due to pollen: Secondary | ICD-10-CM | POA: Diagnosis not present

## 2020-04-23 DIAGNOSIS — Z853 Personal history of malignant neoplasm of breast: Secondary | ICD-10-CM | POA: Diagnosis not present

## 2020-04-23 DIAGNOSIS — M810 Age-related osteoporosis without current pathological fracture: Secondary | ICD-10-CM | POA: Diagnosis not present

## 2020-04-23 DIAGNOSIS — I10 Essential (primary) hypertension: Secondary | ICD-10-CM | POA: Diagnosis not present

## 2020-04-23 DIAGNOSIS — M25569 Pain in unspecified knee: Secondary | ICD-10-CM | POA: Diagnosis not present

## 2020-04-23 DIAGNOSIS — E039 Hypothyroidism, unspecified: Secondary | ICD-10-CM | POA: Diagnosis not present

## 2020-06-10 DIAGNOSIS — M1712 Unilateral primary osteoarthritis, left knee: Secondary | ICD-10-CM | POA: Diagnosis not present

## 2020-06-18 DIAGNOSIS — M1712 Unilateral primary osteoarthritis, left knee: Secondary | ICD-10-CM | POA: Diagnosis not present

## 2020-06-18 DIAGNOSIS — R531 Weakness: Secondary | ICD-10-CM | POA: Diagnosis not present

## 2020-06-18 DIAGNOSIS — R262 Difficulty in walking, not elsewhere classified: Secondary | ICD-10-CM | POA: Diagnosis not present

## 2020-06-21 DIAGNOSIS — E782 Mixed hyperlipidemia: Secondary | ICD-10-CM | POA: Diagnosis not present

## 2020-06-21 DIAGNOSIS — R911 Solitary pulmonary nodule: Secondary | ICD-10-CM | POA: Diagnosis not present

## 2020-06-21 DIAGNOSIS — E1165 Type 2 diabetes mellitus with hyperglycemia: Secondary | ICD-10-CM | POA: Diagnosis not present

## 2020-06-21 DIAGNOSIS — E039 Hypothyroidism, unspecified: Secondary | ICD-10-CM | POA: Diagnosis not present

## 2020-06-21 DIAGNOSIS — M816 Localized osteoporosis [Lequesne]: Secondary | ICD-10-CM | POA: Diagnosis not present

## 2020-07-09 DIAGNOSIS — Z85828 Personal history of other malignant neoplasm of skin: Secondary | ICD-10-CM | POA: Diagnosis not present

## 2020-07-09 DIAGNOSIS — L821 Other seborrheic keratosis: Secondary | ICD-10-CM | POA: Diagnosis not present

## 2020-07-09 DIAGNOSIS — L814 Other melanin hyperpigmentation: Secondary | ICD-10-CM | POA: Diagnosis not present

## 2020-07-09 DIAGNOSIS — C44519 Basal cell carcinoma of skin of other part of trunk: Secondary | ICD-10-CM | POA: Diagnosis not present

## 2020-07-09 DIAGNOSIS — L570 Actinic keratosis: Secondary | ICD-10-CM | POA: Diagnosis not present

## 2020-07-21 DIAGNOSIS — M816 Localized osteoporosis [Lequesne]: Secondary | ICD-10-CM | POA: Diagnosis not present

## 2020-07-21 DIAGNOSIS — E782 Mixed hyperlipidemia: Secondary | ICD-10-CM | POA: Diagnosis not present

## 2020-07-21 DIAGNOSIS — R911 Solitary pulmonary nodule: Secondary | ICD-10-CM | POA: Diagnosis not present

## 2020-07-21 DIAGNOSIS — R7301 Impaired fasting glucose: Secondary | ICD-10-CM | POA: Diagnosis not present

## 2020-07-21 DIAGNOSIS — M810 Age-related osteoporosis without current pathological fracture: Secondary | ICD-10-CM | POA: Diagnosis not present

## 2020-07-21 DIAGNOSIS — E1165 Type 2 diabetes mellitus with hyperglycemia: Secondary | ICD-10-CM | POA: Diagnosis not present

## 2020-07-21 DIAGNOSIS — E039 Hypothyroidism, unspecified: Secondary | ICD-10-CM | POA: Diagnosis not present

## 2020-09-10 DIAGNOSIS — E1165 Type 2 diabetes mellitus with hyperglycemia: Secondary | ICD-10-CM | POA: Diagnosis not present

## 2020-09-10 DIAGNOSIS — I10 Essential (primary) hypertension: Secondary | ICD-10-CM | POA: Diagnosis not present

## 2020-10-07 DIAGNOSIS — R5383 Other fatigue: Secondary | ICD-10-CM | POA: Diagnosis not present

## 2020-10-07 DIAGNOSIS — R7301 Impaired fasting glucose: Secondary | ICD-10-CM | POA: Diagnosis not present

## 2020-10-07 DIAGNOSIS — E538 Deficiency of other specified B group vitamins: Secondary | ICD-10-CM | POA: Diagnosis not present

## 2020-10-07 DIAGNOSIS — E785 Hyperlipidemia, unspecified: Secondary | ICD-10-CM | POA: Diagnosis not present

## 2020-10-07 DIAGNOSIS — E559 Vitamin D deficiency, unspecified: Secondary | ICD-10-CM | POA: Diagnosis not present

## 2020-10-14 DIAGNOSIS — R911 Solitary pulmonary nodule: Secondary | ICD-10-CM | POA: Diagnosis not present

## 2020-10-14 DIAGNOSIS — R7301 Impaired fasting glucose: Secondary | ICD-10-CM | POA: Diagnosis not present

## 2020-10-14 DIAGNOSIS — Z853 Personal history of malignant neoplasm of breast: Secondary | ICD-10-CM | POA: Diagnosis not present

## 2020-10-14 DIAGNOSIS — M25562 Pain in left knee: Secondary | ICD-10-CM | POA: Diagnosis not present

## 2020-10-14 DIAGNOSIS — M810 Age-related osteoporosis without current pathological fracture: Secondary | ICD-10-CM | POA: Diagnosis not present

## 2020-10-14 DIAGNOSIS — E039 Hypothyroidism, unspecified: Secondary | ICD-10-CM | POA: Diagnosis not present

## 2020-10-14 DIAGNOSIS — J302 Other seasonal allergic rhinitis: Secondary | ICD-10-CM | POA: Diagnosis not present

## 2020-10-14 DIAGNOSIS — E785 Hyperlipidemia, unspecified: Secondary | ICD-10-CM | POA: Diagnosis not present

## 2020-10-14 DIAGNOSIS — E538 Deficiency of other specified B group vitamins: Secondary | ICD-10-CM | POA: Diagnosis not present

## 2020-10-21 DIAGNOSIS — I10 Essential (primary) hypertension: Secondary | ICD-10-CM | POA: Diagnosis not present

## 2020-10-21 DIAGNOSIS — E1165 Type 2 diabetes mellitus with hyperglycemia: Secondary | ICD-10-CM | POA: Diagnosis not present

## 2020-11-21 DIAGNOSIS — E7800 Pure hypercholesterolemia, unspecified: Secondary | ICD-10-CM | POA: Diagnosis not present

## 2020-11-21 DIAGNOSIS — E1165 Type 2 diabetes mellitus with hyperglycemia: Secondary | ICD-10-CM | POA: Diagnosis not present

## 2020-11-21 DIAGNOSIS — I10 Essential (primary) hypertension: Secondary | ICD-10-CM | POA: Diagnosis not present

## 2020-12-06 DIAGNOSIS — G8929 Other chronic pain: Secondary | ICD-10-CM | POA: Diagnosis not present

## 2020-12-06 DIAGNOSIS — E785 Hyperlipidemia, unspecified: Secondary | ICD-10-CM | POA: Diagnosis not present

## 2020-12-06 DIAGNOSIS — I739 Peripheral vascular disease, unspecified: Secondary | ICD-10-CM | POA: Diagnosis not present

## 2020-12-06 DIAGNOSIS — E039 Hypothyroidism, unspecified: Secondary | ICD-10-CM | POA: Diagnosis not present

## 2020-12-06 DIAGNOSIS — I10 Essential (primary) hypertension: Secondary | ICD-10-CM | POA: Diagnosis not present

## 2020-12-06 DIAGNOSIS — E559 Vitamin D deficiency, unspecified: Secondary | ICD-10-CM | POA: Diagnosis not present

## 2020-12-06 DIAGNOSIS — E669 Obesity, unspecified: Secondary | ICD-10-CM | POA: Diagnosis not present

## 2020-12-06 DIAGNOSIS — R69 Illness, unspecified: Secondary | ICD-10-CM | POA: Diagnosis not present

## 2020-12-06 DIAGNOSIS — E538 Deficiency of other specified B group vitamins: Secondary | ICD-10-CM | POA: Diagnosis not present

## 2020-12-22 DIAGNOSIS — I10 Essential (primary) hypertension: Secondary | ICD-10-CM | POA: Diagnosis not present

## 2020-12-22 DIAGNOSIS — E1165 Type 2 diabetes mellitus with hyperglycemia: Secondary | ICD-10-CM | POA: Diagnosis not present

## 2021-01-17 DIAGNOSIS — R69 Illness, unspecified: Secondary | ICD-10-CM | POA: Diagnosis not present

## 2021-01-17 DIAGNOSIS — R0981 Nasal congestion: Secondary | ICD-10-CM | POA: Diagnosis not present

## 2021-01-17 DIAGNOSIS — Z1152 Encounter for screening for COVID-19: Secondary | ICD-10-CM | POA: Diagnosis not present

## 2021-01-17 DIAGNOSIS — J069 Acute upper respiratory infection, unspecified: Secondary | ICD-10-CM | POA: Diagnosis not present

## 2021-01-17 DIAGNOSIS — F411 Generalized anxiety disorder: Secondary | ICD-10-CM | POA: Diagnosis not present

## 2021-01-21 DIAGNOSIS — I10 Essential (primary) hypertension: Secondary | ICD-10-CM | POA: Diagnosis not present

## 2021-01-21 DIAGNOSIS — E1165 Type 2 diabetes mellitus with hyperglycemia: Secondary | ICD-10-CM | POA: Diagnosis not present

## 2021-02-21 DIAGNOSIS — E1165 Type 2 diabetes mellitus with hyperglycemia: Secondary | ICD-10-CM | POA: Diagnosis not present

## 2021-02-21 DIAGNOSIS — I10 Essential (primary) hypertension: Secondary | ICD-10-CM | POA: Diagnosis not present

## 2021-04-22 DIAGNOSIS — E782 Mixed hyperlipidemia: Secondary | ICD-10-CM | POA: Diagnosis not present

## 2021-04-22 DIAGNOSIS — I10 Essential (primary) hypertension: Secondary | ICD-10-CM | POA: Diagnosis not present

## 2021-05-24 DIAGNOSIS — I10 Essential (primary) hypertension: Secondary | ICD-10-CM | POA: Diagnosis not present

## 2021-07-01 DIAGNOSIS — E039 Hypothyroidism, unspecified: Secondary | ICD-10-CM | POA: Diagnosis not present

## 2021-07-01 DIAGNOSIS — E538 Deficiency of other specified B group vitamins: Secondary | ICD-10-CM | POA: Diagnosis not present

## 2021-07-01 DIAGNOSIS — R7301 Impaired fasting glucose: Secondary | ICD-10-CM | POA: Diagnosis not present

## 2021-07-01 DIAGNOSIS — I10 Essential (primary) hypertension: Secondary | ICD-10-CM | POA: Diagnosis not present

## 2021-07-01 DIAGNOSIS — E559 Vitamin D deficiency, unspecified: Secondary | ICD-10-CM | POA: Diagnosis not present

## 2021-07-07 DIAGNOSIS — J302 Other seasonal allergic rhinitis: Secondary | ICD-10-CM | POA: Diagnosis not present

## 2021-07-07 DIAGNOSIS — R7301 Impaired fasting glucose: Secondary | ICD-10-CM | POA: Diagnosis not present

## 2021-07-07 DIAGNOSIS — M25562 Pain in left knee: Secondary | ICD-10-CM | POA: Diagnosis not present

## 2021-07-07 DIAGNOSIS — M810 Age-related osteoporosis without current pathological fracture: Secondary | ICD-10-CM | POA: Diagnosis not present

## 2021-07-07 DIAGNOSIS — Z853 Personal history of malignant neoplasm of breast: Secondary | ICD-10-CM | POA: Diagnosis not present

## 2021-07-07 DIAGNOSIS — R911 Solitary pulmonary nodule: Secondary | ICD-10-CM | POA: Diagnosis not present

## 2021-07-07 DIAGNOSIS — E039 Hypothyroidism, unspecified: Secondary | ICD-10-CM | POA: Diagnosis not present

## 2021-07-07 DIAGNOSIS — E538 Deficiency of other specified B group vitamins: Secondary | ICD-10-CM | POA: Diagnosis not present

## 2021-07-07 DIAGNOSIS — E785 Hyperlipidemia, unspecified: Secondary | ICD-10-CM | POA: Diagnosis not present

## 2021-07-08 ENCOUNTER — Other Ambulatory Visit: Payer: Self-pay | Admitting: Internal Medicine

## 2021-07-08 ENCOUNTER — Other Ambulatory Visit (HOSPITAL_COMMUNITY): Payer: Self-pay | Admitting: Internal Medicine

## 2021-07-08 DIAGNOSIS — J069 Acute upper respiratory infection, unspecified: Secondary | ICD-10-CM

## 2021-07-08 DIAGNOSIS — R0602 Shortness of breath: Secondary | ICD-10-CM

## 2021-07-22 DIAGNOSIS — E1165 Type 2 diabetes mellitus with hyperglycemia: Secondary | ICD-10-CM | POA: Diagnosis not present

## 2021-07-22 DIAGNOSIS — I10 Essential (primary) hypertension: Secondary | ICD-10-CM | POA: Diagnosis not present

## 2021-08-05 ENCOUNTER — Ambulatory Visit (HOSPITAL_COMMUNITY)
Admission: RE | Admit: 2021-08-05 | Discharge: 2021-08-05 | Disposition: A | Discharge status: Home / Self Care | Payer: Medicare HMO | Source: Ambulatory Visit | Attending: Internal Medicine | Admitting: Internal Medicine

## 2021-08-05 DIAGNOSIS — R0602 Shortness of breath: Secondary | ICD-10-CM | POA: Diagnosis not present

## 2021-08-05 DIAGNOSIS — I700 Atherosclerosis of aorta: Secondary | ICD-10-CM | POA: Diagnosis not present

## 2021-08-05 DIAGNOSIS — R918 Other nonspecific abnormal finding of lung field: Secondary | ICD-10-CM | POA: Diagnosis not present

## 2021-08-05 DIAGNOSIS — J069 Acute upper respiratory infection, unspecified: Secondary | ICD-10-CM | POA: Insufficient documentation

## 2021-08-23 ENCOUNTER — Inpatient Hospital Stay (HOSPITAL_COMMUNITY): Payer: Medicare HMO | Attending: Hematology | Admitting: Hematology

## 2021-08-23 ENCOUNTER — Other Ambulatory Visit (HOSPITAL_COMMUNITY): Payer: Self-pay

## 2021-08-23 DIAGNOSIS — Z803 Family history of malignant neoplasm of breast: Secondary | ICD-10-CM | POA: Insufficient documentation

## 2021-08-23 DIAGNOSIS — Z801 Family history of malignant neoplasm of trachea, bronchus and lung: Secondary | ICD-10-CM | POA: Insufficient documentation

## 2021-08-23 DIAGNOSIS — Z853 Personal history of malignant neoplasm of breast: Secondary | ICD-10-CM | POA: Insufficient documentation

## 2021-08-23 DIAGNOSIS — F17210 Nicotine dependence, cigarettes, uncomplicated: Secondary | ICD-10-CM | POA: Insufficient documentation

## 2021-08-23 DIAGNOSIS — R911 Solitary pulmonary nodule: Secondary | ICD-10-CM | POA: Insufficient documentation

## 2021-08-23 DIAGNOSIS — Z808 Family history of malignant neoplasm of other organs or systems: Secondary | ICD-10-CM | POA: Insufficient documentation

## 2021-08-23 DIAGNOSIS — Z9013 Acquired absence of bilateral breasts and nipples: Secondary | ICD-10-CM | POA: Insufficient documentation

## 2021-08-23 DIAGNOSIS — R69 Illness, unspecified: Secondary | ICD-10-CM | POA: Diagnosis not present

## 2021-08-23 MED ORDER — DIAZEPAM 5 MG PO TABS: 5.0000 mg | ORAL_TABLET | Freq: Once | ORAL | 0 refills | Status: AC

## 2021-08-23 NOTE — Progress Notes (Signed)
? ?[facility] ?[address] ?[place], [place] [address] ? ? ?CLINIC:  ?Medical Oncology/Hematology ? ?CONSULT NOTE ? ?Patient Care Team: ?Celene Squibb, MD as PCP - General (Internal Medicine) ?Derek Jack, MD as Medical Oncologist (Medical Oncology) ?Brien Mates, RN as Oncology Nurse Navigator (Medical Oncology) ? ?CHIEF COMPLAINTS/PURPOSE OF CONSULTATION:  ?Evaluation of enlarging right upper lobe pulmonary nodule ? ?HISTORY OF PRESENTING ILLNESS:  ?[name] 81 y.o. female is here because of evaluation of enlarging right upper lobe pulmonary nodule, at the request of Dr. Nevada Crane. ? ?Today she reports feeling well. She reports occasional cough productive of clear sputum.  She denies night sweats and weight loss. She reports recent sinus infection which has accompanied by a low grade fever. She has a history of bilateral breast cancer treated with bilateral mastectomy. 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 She reports chronic pain in her left leg due to a surgery she had in that leg at 81 years old. She denies history of CVA and MI. She report occasional ankle swellings.  ? 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 She denies chemical asbestos exposure. She smokes 2 packs per week and has smoked since she was 50 years old. Her brother has lung cancer, her mother, a niece, and her sister had breast cancer, her paternal aunt had melanoma, her paternal first cousin had thyroid and breast cancer, and another niece had leukemia.  ? ?MEDICAL HISTORY:  ?Past Medical History:  ?Diagnosis Date  ? Abnormal heart rhythms   ? Allergic rhinitis   ? Anxiety   ? Arthritis   ? Bronchitis   ? Chest wall pain   ? ANTERIOR  ? Depression   ? Dysuria   ? Eustachian tube dysfunction   ? Gallstones   ? Gallstones   ? GERD (gastroesophageal reflux disease)   ? Hyperlipidemia   ? Hypertension   ? Hypothyroidism   ? Hypoxemia   ?  Incontinence   ? Liver function study, abnormal   ? Malaise and fatigue   ? Microscopic hematuria   ? Osteopenia   ? Personal history of colonic polyps   ? Primary breast malignancy ([place])   ? Snoring   ? Tobacco abuse   ? Ulcer   ? ? ?SURGICAL HISTORY: ?Past Surgical History:  ?Procedure Laterality Date  ? BILATERAL MASTECTOMY    ? COLONOSCOPY  4/07  ? BHA:[id number] internal hemorrhoids otherwise normal  ? COLONOSCOPY  05/25/2010  ? IOX:BDZHGDJM hemorrhoids, otherwise normal rectum and colon.(POOR PREP). Due for surveillance 2017.   ? COLONOSCOPY  04/22/2002    ? RMR: Internal hemorrhoids, otherwise normal rectum  Polyps in the left colon resected with snare  ? COLONOSCOPY N/A 11/30/2017  ? Procedure: COLONOSCOPY;  Surgeon: Daneil Dolin, MD;  [facility];  Service: Endoscopy;  Laterality: N/A;  12:00pm  ? ESOPHAGOGASTRODUODENOSCOPY  05/25/2010  ? RMR:EGD distal esophageal erosions consistent with mild erosive reflux esophagitis, otherwise unremarkable esophagus status post passage of a 56-French Maloney dilator/Small hiatal hernia/Gastric ulcer with negative H.pylori. Overdue for surveillance.   ? ESOPHAGOGASTRODUODENOSCOPY N/A 03/10/2013  ? Dr. Gala Romney: Schatzki's ring s/p dilation, erosive reflux esophagitis, hiatal hernia, gastric erosions, negative h.pylori  ? ESOPHAGOGASTRODUODENOSCOPY N/A 11/30/2017  ? Procedure: ESOPHAGOGASTRODUODENOSCOPY (EGD);  Surgeon: Daneil Dolin, MD;  [facility];  Service: Endoscopy;  Laterality: N/A;  ? LEFT LEG SURGERY    ?  MULTIPLE DUE TO FX  ? LEFT RTC TEAR    ? [place] N/A 03/10/2013  ? Procedure: MALONEY DILATION;  Surgeon: Daneil Dolin, MD;  [facility];  Service: Endoscopy;  Laterality: N/A;  ? MALONEY DILATION N/A 11/30/2017  ? Procedure: MALONEY DILATION;  Surgeon: Daneil Dolin, MD;  [facility];  Service: Endoscopy;  Laterality: N/A;  ? SAVORY DILATION N/A 03/10/2013  ? Procedure: SAVORY DILATION;  Surgeon: Daneil Dolin, MD;  [facility];  Service: Endoscopy;  Laterality: N/A;  ? TONSILECTOMY, ADENOIDECTOMY, BILATERAL MYRINGOTOMY AND TUBES    ? TONSILLECTOMY    ? VESICOVAGINAL FISTULA CLOSURE W/ TAH    ? excessive bleeding-benign  ? ? ?SOCIAL HISTORY: ?Social History  ? ?Socioeconomic History  ? Marital status: Single  ?  Spouse name: Not on file  ? Number of children: Not on file  ? Years of education: Not on file  ? Highest education level: Not on file  ?Occupational History  ? Occupation: retired  ?Tobacco Use  ? Smoking status: Every Day  ?  Packs/day: 0.25  ?  Years: 25.00  ?  Pack years: 6.25  ?  Types: Cigarettes  ? Smokeless tobacco: Never  ? Tobacco comments:  ?  Smokes a pack of cigarettes about every 2-3 days  ?Vaping Use  ? Vaping Use: Never used  ?Substance and Sexual Activity  ? Alcohol use: No  ? Drug use: No  ? Sexual activity: Not Currently  ?Other Topics Concern  ? Not on file  ?Social History Narrative  ? Not on file  ? ?Social Determinants of Health  ? ?Financial Resource Strain: Not on file  ?Food Insecurity: Not on file  ?Transportation Needs: Not on file  ?Physical Activity: Not on file  ?Stress: Not on file  ?Social Connections: Not on file  ?Intimate Partner Violence: Not on file  ? ? ?FAMILY HISTORY: ?Family History  ?Problem Relation Age of Onset  ? Cancer Mother   ? Heart disease Father   ? Heart disease Brother   ? Cancer Sister   ? Aneurysm Sister   ? Colon cancer Neg Hx   ? ? ?ALLERGIES:  ?is allergic to sulfonamide derivatives. ? ?MEDICATIONS:  ?Current Outpatient Medications  ?Medication Sig Dispense Refill  ? acetaminophen (TYLENOL) 500 MG tablet Take 500-1,000 mg by mouth daily as needed (for arthritis pain.).     ? amLODipine (NORVASC) 10 MG tablet Take 10 mg by mouth daily.      ? aspirin EC 81 MG tablet Take 81 mg by mouth daily.    ? atorvastatin (LIPITOR) 40 MG tablet Take 40 mg by mouth daily.    ? carvedilol (COREG) 3.125 MG tablet Take 3.125 mg by mouth 2 (two) times  daily with a meal.    ? Cholecalciferol (VITAMIN D3) 1.25 MG (50000 UT) CAPS Take 1,000 mcg by mouth.    ? fexofenadine (ALLEGRA) 180 MG tablet Take 180 mg by mouth daily.    ? hydrochlorothiazide (HYDRODIURIL) 25 MG tablet Take 25 mg by mouth daily.    ? levothyroxine (SYNTHROID, LEVOTHROID) 137 MCG tablet Take 137 mcg by mouth daily before breakfast.   5  ? lisinopril (ZESTRIL) 40 MG tablet Take 40 mg by mouth daily.    ? vitamin B-12 (CYANOCOBALAMIN) 1000 MCG tablet Take 1,000 mcg by mouth daily.    ? diazepam (VALIUM) 5 MG tablet Take 1 tablet (5 mg total) by mouth once for  1 dose. Take 1 hour prior to scan. 1 tablet 0  ? ?No current facility-administered medications for this visit.  ? ? ?REVIEW OF SYSTEMS:   ?Review of Systems  ?Constitutional:  Positive for fatigue and fever. Negative for appetite change and unexpected weight change.  ?Respiratory:  Positive for cough and shortness of breath.   ?Cardiovascular:  Positive for leg swelling (occasional).  ?Endocrine: Negative for hot flashes.  ?Musculoskeletal:  Positive for arthralgias (L leg).  ?All other systems reviewed and are negative. ? ? ?PHYSICAL EXAMINATION: ?ECOG PERFORMANCE STATUS: 1 - Symptomatic but completely ambulatory ? ?Vitals:  ? 08/23/21 0805  ?BP: 139/68  ?Pulse: 76  ?Resp: 18  ?Temp: (!) 96.6 ?F (35.9 ?C)  ?SpO2: 95%  ? ?Filed Weights  ? 08/23/21 0805  ?Weight: 197 lb 8.5 oz (89.6 kg)  ? ?Physical Exam ?Vitals reviewed.  ?Constitutional:   ?   Appearance: Normal appearance. She is obese.  ?Cardiovascular:  ?   Rate and Rhythm: Normal rate and regular rhythm.  ?   Pulses: Normal pulses.  ?   Heart sounds: Normal heart sounds.  ?Pulmonary:  ?   Effort: Pulmonary effort is normal.  ?   Breath sounds: Normal breath sounds.  ?Chest:  ?Breasts: ?   Right: No swelling, bleeding, mass, skin change (mastectomy site WNL) or tenderness.  ?   Left: No swelling, bleeding, mass, skin change (mastectomy site WNL) or tenderness.  ?Abdominal:  ?    Palpations: Abdomen is soft. There is no hepatomegaly, splenomegaly or mass.  ?   Tenderness: There is no abdominal tenderness.  ?   Hernia: A hernia is present.  ?Musculoskeletal:  ?   Right lower leg: No edema.  ?   Left low

## 2021-08-23 NOTE — Patient Instructions (Addendum)
[place] at [facility] ?Discharge Instructions ? ?You were seen and examined today by Dr. Delton Coombes. Dr. Delton Coombes is a medical oncologist, meaning that he specializes in the treatment of cancer diagnoses. Dr. Delton Coombes discussed your past medical history, family history of cancers, and the events that led to you being here today. ? ?You were referred to Dr. Delton Coombes by Dr. Nevada Crane due to an abnormal CT scan which revealed a nodule in the upper lobe of your right lung. This was noted previously but it appears to be bigger than in was in previous studies. ? ?Dr. Delton Coombes has recommended a PET scan. A PET scan is a specialized CT scan that illuminates where there is cancer present in the body. If the enlarging nodule noted in your lung lights up, that is indicative of cancer and would require additional work-up. ? ?Follow-up with Dr. Delton Coombes following the PET scan. ? ? ?Thank you for choosing [place] at [facility] to provide your oncology and hematology care.  To afford each patient quality time with our provider, please arrive at least 15 minutes before your scheduled appointment time.  ? ?If you have a lab appointment with the [facility] please come in thru the Main Entrance and check in at the main information desk. ? ?You need to re-schedule your appointment should you arrive 10 or more minutes late.  We strive to give you quality time with our providers, and arriving late affects you and other patients whose appointments are after yours.  Also, if you no show three or more times for appointments you may be dismissed from the clinic at the providers discretion.     ?Again, thank you for choosing [facility].  Our hope is that these requests will decrease the amount of time that you wait before being seen by our physicians.       ?_____________________________________________________________ ? ?Should you have questions after your visit to  [facility], please contact our office at [phone] and follow the prompts.  Our office hours are 8:00 a.m. and 4:30 p.m. Monday - Friday.  Please note that voicemails left after 4:00 p.m. may not be returned until the following business day.  We are closed weekends and major holidays.  You do have access to a nurse 24-7, just call the main number to the clinic [phone] and do not press any options, hold on the line and a nurse will answer the phone.   ? ?For prescription refill requests, have your pharmacy contact our office and allow 72 hours.   ? ?Due to Covid, you will need to wear a mask upon entering the hospital. If you do not have a mask, a mask will be given to you at the Main Entrance upon arrival. For doctor visits, patients may have 1 support person age 79 or older with them. For treatment visits, patients can not have anyone with them due to social distancing guidelines and our immunocompromised population.  ? ? ? ?

## 2021-09-01 ENCOUNTER — Encounter (HOSPITAL_COMMUNITY)
Admission: RE | Admit: 2021-09-01 | Discharge: 2021-09-01 | Disposition: A | Discharge status: Home / Self Care | Payer: Medicare HMO | Source: Ambulatory Visit | Attending: Hematology | Admitting: Hematology

## 2021-09-01 DIAGNOSIS — I6529 Occlusion and stenosis of unspecified carotid artery: Secondary | ICD-10-CM | POA: Diagnosis not present

## 2021-09-01 DIAGNOSIS — R911 Solitary pulmonary nodule: Secondary | ICD-10-CM | POA: Insufficient documentation

## 2021-09-01 DIAGNOSIS — K439 Ventral hernia without obstruction or gangrene: Secondary | ICD-10-CM | POA: Diagnosis not present

## 2021-09-01 DIAGNOSIS — J984 Other disorders of lung: Secondary | ICD-10-CM | POA: Diagnosis not present

## 2021-09-01 DIAGNOSIS — K8020 Calculus of gallbladder without cholecystitis without obstruction: Secondary | ICD-10-CM | POA: Diagnosis not present

## 2021-09-01 MED ORDER — FLUDEOXYGLUCOSE F - 18 (FDG) INJECTION
11.4000 | Freq: Once | INTRAVENOUS | Status: AC | PRN
  Administered 2021-09-01: 11.4 via INTRAVENOUS

## 2021-09-06 ENCOUNTER — Inpatient Hospital Stay (HOSPITAL_BASED_OUTPATIENT_CLINIC_OR_DEPARTMENT_OTHER): Payer: Medicare HMO | Admitting: Hematology

## 2021-09-06 VITALS — BP 132/78 | HR 71 | Temp 97.6°F | Resp 18 | Ht 63.19 in | Wt 197.1 lb

## 2021-09-06 DIAGNOSIS — Z9013 Acquired absence of bilateral breasts and nipples: Secondary | ICD-10-CM | POA: Diagnosis not present

## 2021-09-06 DIAGNOSIS — Z808 Family history of malignant neoplasm of other organs or systems: Secondary | ICD-10-CM | POA: Diagnosis not present

## 2021-09-06 DIAGNOSIS — R911 Solitary pulmonary nodule: Secondary | ICD-10-CM

## 2021-09-06 DIAGNOSIS — Z853 Personal history of malignant neoplasm of breast: Secondary | ICD-10-CM | POA: Diagnosis not present

## 2021-09-06 DIAGNOSIS — Z803 Family history of malignant neoplasm of breast: Secondary | ICD-10-CM | POA: Diagnosis not present

## 2021-09-06 DIAGNOSIS — Z801 Family history of malignant neoplasm of trachea, bronchus and lung: Secondary | ICD-10-CM | POA: Diagnosis not present

## 2021-09-06 DIAGNOSIS — R69 Illness, unspecified: Secondary | ICD-10-CM | POA: Diagnosis not present

## 2021-09-06 NOTE — Progress Notes (Signed)
? ?[name] ?[address] ?[place], [place] [address] ? ? ?CLINIC:  ?Medical Oncology/Hematology ? ?PCP:  ?Susan Squibb, MD ?[address] [place] [address] ?[phone] ? ? ?REASON FOR VISIT:  ?Follow-up for enlarging right upper lobe pulmonary nodule ? ?PRIOR THERAPY: none ? ?NGS Results: not done ? ?CURRENT THERAPY: under work-up ? ?BRIEF ONCOLOGIC HISTORY:  ?Oncology History  ? No history exists.  ? ? ?CANCER STAGING: ?Cancer Staging  ?No matching staging information was found for the patient. ? ?INTERVAL HISTORY:  ?[name], a 81 y.o. female, returns for routine follow-up of her enlarging right upper lobe pulmonary nodule. [name] was last seen on 08/23/2021.  ? ?Today she reports feeling good. She denies rash.  ? ?REVIEW OF SYSTEMS:  ?Review of Systems  ?Constitutional:  Negative for appetite change and fatigue.  ?Respiratory:  Positive for cough and shortness of breath.   ?Gastrointestinal:  Positive for constipation.  ?Musculoskeletal:  Positive for arthralgias (8/10 L leg).  ?Skin:  Negative for rash.  ?Neurological:  Positive for dizziness.  ?All other systems reviewed and are negative. ? ?PAST MEDICAL/SURGICAL HISTORY:  ?Past Medical History:  ?Diagnosis Date  ? Abnormal heart rhythms   ? Allergic rhinitis   ? Anxiety   ? Arthritis   ? Bronchitis   ? Chest wall pain   ? ANTERIOR  ? Depression   ? Dysuria   ? Eustachian tube dysfunction   ? Gallstones   ? Gallstones   ? GERD (gastroesophageal reflux disease)   ? Hyperlipidemia   ? Hypertension   ? Hypothyroidism   ? Hypoxemia   ? Incontinence   ? Liver function study, abnormal   ? Malaise and fatigue   ? Microscopic hematuria   ? Osteopenia   ? Personal history of colonic polyps   ? Primary breast malignancy ([place])   ? Snoring   ? Tobacco abuse   ? Ulcer   ? ?Past Surgical History:  ?Procedure Laterality Date  ? BILATERAL MASTECTOMY    ? COLONOSCOPY  4/07  ? HWE:XHBZJIR internal hemorrhoids otherwise normal  ? COLONOSCOPY   05/25/2010  ? CVE:[id number] hemorrhoids, otherwise normal rectum and colon.(POOR PREP). Due for surveillance 2017.   ? COLONOSCOPY  04/22/2002    ? RMR: Internal hemorrhoids, otherwise normal rectum  Polyps in the left colon resected with snare  ? COLONOSCOPY N/A 11/30/2017  ? Procedure: COLONOSCOPY;  Surgeon: Daneil Dolin, MD;  [facility];  Service: Endoscopy;  Laterality: N/A;  12:00pm  ? ESOPHAGOGASTRODUODENOSCOPY  05/25/2010  ? RMR:EGD distal esophageal erosions consistent with mild erosive reflux esophagitis, otherwise unremarkable esophagus status post passage of a 56-French Maloney dilator/Small hiatal hernia/Gastric ulcer with negative H.pylori. Overdue for surveillance.   ? ESOPHAGOGASTRODUODENOSCOPY N/A 03/10/2013  ? Dr. Gala Romney: Schatzki's ring s/p dilation, erosive reflux esophagitis, hiatal hernia, gastric erosions, negative h.pylori  ? ESOPHAGOGASTRODUODENOSCOPY N/A 11/30/2017  ? Procedure: ESOPHAGOGASTRODUODENOSCOPY (EGD);  Surgeon: Daneil Dolin, MD;  [facility];  Service: Endoscopy;  Laterality: N/A;  ? LEFT LEG SURGERY    ? MULTIPLE DUE TO FX  ? LEFT RTC TEAR    ? [place] N/A 03/10/2013  ? Procedure: MALONEY DILATION;  Surgeon: Daneil Dolin, MD;  [facility];  Service: Endoscopy;  Laterality: N/A;  ? MALONEY DILATION N/A 11/30/2017  ? Procedure: MALONEY DILATION;  Surgeon: Daneil Dolin, MD;  [facility];  Service: Endoscopy;  Laterality:  N/A;  ? SAVORY DILATION N/A 03/10/2013  ? Procedure: SAVORY DILATION;  Surgeon: Daneil Dolin, MD;  [facility];  Service: Endoscopy;  Laterality: N/A;  ? TONSILECTOMY, ADENOIDECTOMY, BILATERAL MYRINGOTOMY AND TUBES    ? TONSILLECTOMY    ? VESICOVAGINAL FISTULA CLOSURE W/ TAH    ? excessive bleeding-benign  ? ? ?SOCIAL HISTORY:  ?Social History  ? ?Socioeconomic History  ? Marital status: Single  ?  Spouse name: Not on file  ? Number of children: Not on file  ? Years of education: Not on file   ? Highest education level: Not on file  ?Occupational History  ? Occupation: retired  ?Tobacco Use  ? Smoking status: Every Day  ?  Packs/day: 0.25  ?  Years: 25.00  ?  Pack years: 6.25  ?  Types: Cigarettes  ? Smokeless tobacco: Never  ? Tobacco comments:  ?  Smokes a pack of cigarettes about every 2-3 days  ?Vaping Use  ? Vaping Use: Never used  ?Substance and Sexual Activity  ? Alcohol use: No  ? Drug use: No  ? Sexual activity: Not Currently  ?Other Topics Concern  ? Not on file  ?Social History Narrative  ? Not on file  ? ?Social Determinants of Health  ? ?Financial Resource Strain: Not on file  ?Food Insecurity: Not on file  ?Transportation Needs: Not on file  ?Physical Activity: Not on file  ?Stress: Not on file  ?Social Connections: Not on file  ?Intimate Partner Violence: Not on file  ? ? ?FAMILY HISTORY:  ?Family History  ?Problem Relation Age of Onset  ? Cancer Mother   ? Heart disease Father   ? Heart disease Brother   ? Cancer Sister   ? Aneurysm Sister   ? Colon cancer Neg Hx   ? ? ?CURRENT MEDICATIONS:  ?Current Outpatient Medications  ?Medication Sig Dispense Refill  ? acetaminophen (TYLENOL) 500 MG tablet Take 500-1,000 mg by mouth daily as needed (for arthritis pain.).     ? amLODipine (NORVASC) 10 MG tablet Take 10 mg by mouth daily.      ? aspirin EC 81 MG tablet Take 81 mg by mouth daily.    ? atorvastatin (LIPITOR) 40 MG tablet Take 40 mg by mouth daily.    ? carvedilol (COREG) 3.125 MG tablet Take 3.125 mg by mouth 2 (two) times daily with a meal.    ? Cholecalciferol (VITAMIN D3) 1.25 MG (50000 UT) CAPS Take 1,000 mcg by mouth.    ? fexofenadine (ALLEGRA) 180 MG tablet Take 180 mg by mouth daily.    ? hydrochlorothiazide (HYDRODIURIL) 25 MG tablet Take 25 mg by mouth daily.    ? levothyroxine (SYNTHROID, LEVOTHROID) 137 MCG tablet Take 137 mcg by mouth daily before breakfast.   5  ? lisinopril (ZESTRIL) 40 MG tablet Take 40 mg by mouth daily.    ? vitamin B-12 (CYANOCOBALAMIN) 1000 MCG  tablet Take 1,000 mcg by mouth daily.    ? ?No current facility-administered medications for this visit.  ? ? ?ALLERGIES:  ?Allergies  ?Allergen Reactions  ? Sulfonamide Derivatives   ?  REACTION: "Tongue swells up"  ? ? ?PHYSICAL EXAM:  ?Performance status (ECOG): 1 - Symptomatic but completely ambulatory ? ?There were no vitals filed for this visit. ?Wt Readings from Last 3 Encounters:  ?08/23/21 197 lb 8.5 oz (89.6 kg)  ?11/30/17 200 lb (90.7 kg)  ?11/15/17 203 lb (92.1 kg)  ? ?Physical Exam ?Vitals reviewed.  ?Constitutional:   ?  Appearance: Normal appearance.  ?Cardiovascular:  ?   Rate and Rhythm: Normal rate and regular rhythm.  ?   Pulses: Normal pulses.  ?   Heart sounds: Normal heart sounds.  ?Pulmonary:  ?   Effort: Pulmonary effort is normal.  ?   Breath sounds: Normal breath sounds.  ?Abdominal:  ?   General: A surgical scar is present.  ?   Palpations: There is no mass.  ?   Tenderness: There is no abdominal tenderness.  ?Neurological:  ?   General: No focal deficit present.  ?   Mental Status: She is alert and oriented to person, place, and time.  ?Psychiatric:     ?   Mood and Affect: Mood normal.     ?   Behavior: Behavior normal.  ?  ? ?LABORATORY DATA:  ?I have reviewed the labs as listed.  ? ?  Latest Ref Rng & Units 02/28/2013  ? 12:00 AM 08/11/2011  ? 11:41 AM 02/09/2011  ?  9:40 AM  ?CBC  ?WBC 4.0 - 10.5 K/uL  7.5   7.9    ?Hemoglobin g/dL 13.7      13.2   13.5    ?Hematocrit % 40      39.9   40.6    ?Platelets K/?L 286      264   251    ?  ? This result is from an external source.  ? ? ?  Latest Ref Rng & Units 02/28/2013  ? 12:00 AM 08/11/2011  ? 11:41 AM 02/09/2011  ?  9:40 AM  ?CMP  ?Glucose 70 - 99 mg/dL  96   108    ?BUN 6 - 23 mg/dL  14   11    ?Creatinine 0.50 - 1.10 mg/dL  0.52   0.54    ?Sodium 135 - 145 mEq/L  138   138    ?Potassium 3.5 - 5.1 mEq/L  3.6   3.6    ?Chloride 96 - 112 mEq/L  100   100    ?CO2 19 - 32 mEq/L  30   29    ?Calcium 8.4 - 10.5 mg/dL  9.8   9.9    ?Total  Protein 6.0 - 8.3 g/dL  6.9   6.9    ?Total Bilirubin mg/dL 0.4      0.4   0.4    ?Alkaline Phos 39 - 117 U/L  99   114    ?AST U/L '27      20   19    '$ ?ALT 7 - 35 U/L 42      24   27    ?  ? This result is from

## 2021-09-06 NOTE — Patient Instructions (Signed)
[place] at [facility] ?Discharge Instructions ? ? ?You were seen and examined today by Dr. Delton Coombes. ? ?He reviewed the results of your PET scan. There is an area in the lung that is suspicious for a slow growing cancer. He discussed the options of treatment vs watching this. Treatment options would be surgery or radiation. Dr. Raliegh Ip recommends waiting and watching and repeat a scan in 6 months. In 6 months, if the area has grown significantly, we will pursue treatment with radiation.  ? ?We will see you back in 6 months after CT scan.  ? ? ? ? ?Thank you for choosing [place] at [facility] to provide your oncology and hematology care.  To afford each patient quality time with our provider, please arrive at least 15 minutes before your scheduled appointment time.  ? ?If you have a lab appointment with the [facility] please come in thru the Main Entrance and check in at the main information desk. ? ?You need to re-schedule your appointment should you arrive 10 or more minutes late.  We strive to give you quality time with our providers, and arriving late affects you and other patients whose appointments are after yours.  Also, if you no show three or more times for appointments you may be dismissed from the clinic at the providers discretion.     ?Again, thank you for choosing [facility].  Our hope is that these requests will decrease the amount of time that you wait before being seen by our physicians.       ?_____________________________________________________________ ? ?Should you have questions after your visit to [facility], please contact our office at [phone] and follow the prompts.  Our office hours are 8:00 a.m. and 4:30 p.m. Monday - Friday.  Please note that voicemails left after 4:00 p.m. may not be returned until the following business day.  We are closed weekends and major holidays.  You do have access to a nurse  24-7, just call the main number to the clinic [phone] and do not press any options, hold on the line and a nurse will answer the phone.   ? ?For prescription refill requests, have your pharmacy contact our office and allow 72 hours.   ? ?Due to Covid, you will need to wear a mask upon entering the hospital. If you do not have a mask, a mask will be given to you at the Main Entrance upon arrival. For doctor visits, patients may have 1 support person age 53 or older with them. For treatment visits, patients can not have anyone with them due to social distancing guidelines and our immunocompromised population.  ? ?   ?

## 2021-10-07 DIAGNOSIS — L820 Inflamed seborrheic keratosis: Secondary | ICD-10-CM | POA: Diagnosis not present

## 2021-10-07 DIAGNOSIS — L570 Actinic keratosis: Secondary | ICD-10-CM | POA: Diagnosis not present

## 2021-10-07 DIAGNOSIS — D0462 Carcinoma in situ of skin of left upper limb, including shoulder: Secondary | ICD-10-CM | POA: Diagnosis not present

## 2021-10-07 DIAGNOSIS — L821 Other seborrheic keratosis: Secondary | ICD-10-CM | POA: Diagnosis not present

## 2021-10-07 DIAGNOSIS — L720 Epidermal cyst: Secondary | ICD-10-CM | POA: Diagnosis not present

## 2021-10-07 DIAGNOSIS — Z85828 Personal history of other malignant neoplasm of skin: Secondary | ICD-10-CM | POA: Diagnosis not present

## 2021-10-07 DIAGNOSIS — C44519 Basal cell carcinoma of skin of other part of trunk: Secondary | ICD-10-CM | POA: Diagnosis not present

## 2021-10-21 DIAGNOSIS — E039 Hypothyroidism, unspecified: Secondary | ICD-10-CM | POA: Diagnosis not present

## 2021-10-21 DIAGNOSIS — I10 Essential (primary) hypertension: Secondary | ICD-10-CM | POA: Diagnosis not present

## 2021-10-21 DIAGNOSIS — M810 Age-related osteoporosis without current pathological fracture: Secondary | ICD-10-CM | POA: Diagnosis not present

## 2021-10-21 DIAGNOSIS — E785 Hyperlipidemia, unspecified: Secondary | ICD-10-CM | POA: Diagnosis not present

## 2021-11-08 DIAGNOSIS — E538 Deficiency of other specified B group vitamins: Secondary | ICD-10-CM | POA: Diagnosis not present

## 2021-11-08 DIAGNOSIS — E039 Hypothyroidism, unspecified: Secondary | ICD-10-CM | POA: Diagnosis not present

## 2021-11-08 DIAGNOSIS — E559 Vitamin D deficiency, unspecified: Secondary | ICD-10-CM | POA: Diagnosis not present

## 2021-11-08 DIAGNOSIS — R7301 Impaired fasting glucose: Secondary | ICD-10-CM | POA: Diagnosis not present

## 2021-11-15 DIAGNOSIS — R7301 Impaired fasting glucose: Secondary | ICD-10-CM | POA: Diagnosis not present

## 2021-11-15 DIAGNOSIS — E039 Hypothyroidism, unspecified: Secondary | ICD-10-CM | POA: Diagnosis not present

## 2021-11-15 DIAGNOSIS — Z853 Personal history of malignant neoplasm of breast: Secondary | ICD-10-CM | POA: Diagnosis not present

## 2021-11-15 DIAGNOSIS — R911 Solitary pulmonary nodule: Secondary | ICD-10-CM | POA: Diagnosis not present

## 2021-11-15 DIAGNOSIS — M810 Age-related osteoporosis without current pathological fracture: Secondary | ICD-10-CM | POA: Diagnosis not present

## 2021-11-15 DIAGNOSIS — E785 Hyperlipidemia, unspecified: Secondary | ICD-10-CM | POA: Diagnosis not present

## 2021-11-15 DIAGNOSIS — I10 Essential (primary) hypertension: Secondary | ICD-10-CM | POA: Diagnosis not present

## 2021-11-15 DIAGNOSIS — E6609 Other obesity due to excess calories: Secondary | ICD-10-CM | POA: Diagnosis not present

## 2021-11-15 DIAGNOSIS — E538 Deficiency of other specified B group vitamins: Secondary | ICD-10-CM | POA: Diagnosis not present

## 2021-11-15 DIAGNOSIS — J302 Other seasonal allergic rhinitis: Secondary | ICD-10-CM | POA: Diagnosis not present

## 2021-11-15 DIAGNOSIS — M25562 Pain in left knee: Secondary | ICD-10-CM | POA: Diagnosis not present

## 2021-11-15 DIAGNOSIS — F411 Generalized anxiety disorder: Secondary | ICD-10-CM | POA: Diagnosis not present

## 2021-11-21 DIAGNOSIS — H52229 Regular astigmatism, unspecified eye: Secondary | ICD-10-CM | POA: Diagnosis not present

## 2022-02-01 DIAGNOSIS — M1712 Unilateral primary osteoarthritis, left knee: Secondary | ICD-10-CM | POA: Diagnosis not present

## 2022-02-10 DIAGNOSIS — M25662 Stiffness of left knee, not elsewhere classified: Secondary | ICD-10-CM | POA: Diagnosis not present

## 2022-02-10 DIAGNOSIS — M25562 Pain in left knee: Secondary | ICD-10-CM | POA: Diagnosis not present

## 2022-02-14 DIAGNOSIS — M25662 Stiffness of left knee, not elsewhere classified: Secondary | ICD-10-CM | POA: Diagnosis not present

## 2022-02-14 DIAGNOSIS — M25562 Pain in left knee: Secondary | ICD-10-CM | POA: Diagnosis not present

## 2022-02-22 DIAGNOSIS — M25562 Pain in left knee: Secondary | ICD-10-CM | POA: Diagnosis not present

## 2022-02-22 DIAGNOSIS — M25662 Stiffness of left knee, not elsewhere classified: Secondary | ICD-10-CM | POA: Diagnosis not present

## 2022-02-23 DIAGNOSIS — M25562 Pain in left knee: Secondary | ICD-10-CM | POA: Diagnosis not present

## 2022-02-23 DIAGNOSIS — M25662 Stiffness of left knee, not elsewhere classified: Secondary | ICD-10-CM | POA: Diagnosis not present

## 2022-02-27 DIAGNOSIS — M25562 Pain in left knee: Secondary | ICD-10-CM | POA: Diagnosis not present

## 2022-02-27 DIAGNOSIS — M25662 Stiffness of left knee, not elsewhere classified: Secondary | ICD-10-CM | POA: Diagnosis not present

## 2022-03-03 ENCOUNTER — Ambulatory Visit (HOSPITAL_COMMUNITY)
Admission: RE | Admit: 2022-03-03 | Discharge: 2022-03-03 | Disposition: A | Discharge status: Home / Self Care | Payer: Medicare HMO | Source: Ambulatory Visit | Attending: Hematology | Admitting: Hematology

## 2022-03-03 ENCOUNTER — Inpatient Hospital Stay: Payer: Medicare HMO | Attending: Hematology

## 2022-03-03 DIAGNOSIS — Z809 Family history of malignant neoplasm, unspecified: Secondary | ICD-10-CM | POA: Diagnosis not present

## 2022-03-03 DIAGNOSIS — R911 Solitary pulmonary nodule: Secondary | ICD-10-CM | POA: Insufficient documentation

## 2022-03-03 DIAGNOSIS — R69 Illness, unspecified: Secondary | ICD-10-CM | POA: Diagnosis not present

## 2022-03-03 DIAGNOSIS — C50919 Malignant neoplasm of unspecified site of unspecified female breast: Secondary | ICD-10-CM | POA: Diagnosis not present

## 2022-03-03 DIAGNOSIS — F17210 Nicotine dependence, cigarettes, uncomplicated: Secondary | ICD-10-CM | POA: Insufficient documentation

## 2022-03-03 DIAGNOSIS — R918 Other nonspecific abnormal finding of lung field: Secondary | ICD-10-CM | POA: Diagnosis not present

## 2022-03-03 LAB — CBC WITH DIFFERENTIAL/PLATELET
Abs Immature Granulocytes: 0.04 10*3/uL (ref 0.00–0.07)
Basophils Absolute: 0.1 10*3/uL (ref 0.0–0.1)
Basophils Relative: 1 %
Eosinophils Absolute: 0.3 10*3/uL (ref 0.0–0.5)
Eosinophils Relative: 3 %
HCT: 39.6 % (ref 36.0–46.0)
Hemoglobin: 13.3 g/dL (ref 12.0–15.0)
Immature Granulocytes: 0 %
Lymphocytes Relative: 22 %
Lymphs Abs: 2 10*3/uL (ref 0.7–4.0)
MCH: 30.7 pg (ref 26.0–34.0)
MCHC: 33.6 g/dL (ref 30.0–36.0)
MCV: 91.5 fL (ref 80.0–100.0)
Monocytes Absolute: 0.8 10*3/uL (ref 0.1–1.0)
Monocytes Relative: 8 %
Neutro Abs: 6 10*3/uL (ref 1.7–7.7)
Neutrophils Relative %: 66 %
Platelets: 275 10*3/uL (ref 150–400)
RBC: 4.33 MIL/uL (ref 3.87–5.11)
RDW: 12.9 % (ref 11.5–15.5)
WBC: 9.2 10*3/uL (ref 4.0–10.5)
nRBC: 0 % (ref 0.0–0.2)

## 2022-03-03 LAB — COMPREHENSIVE METABOLIC PANEL
ALT: 19 U/L (ref 0–44)
AST: 18 U/L (ref 15–41)
Albumin: 4 g/dL (ref 3.5–5.0)
Alkaline Phosphatase: 95 U/L (ref 38–126)
Anion gap: 9 (ref 5–15)
BUN: 19 mg/dL (ref 8–23)
CO2: 27 mmol/L (ref 22–32)
Calcium: 9.5 mg/dL (ref 8.9–10.3)
Chloride: 99 mmol/L (ref 98–111)
Creatinine, Ser: 0.76 mg/dL (ref 0.44–1.00)
GFR, Estimated: >60 mL/min (ref >60)
Glucose, Bld: 113 mg/dL — ABNORMAL HIGH (ref 70–99)
Potassium: 4.2 mmol/L (ref 3.5–5.1)
Sodium: 135 mmol/L (ref 135–145)
Total Bilirubin: 0.7 mg/dL (ref 0.3–1.2)
Total Protein: 7.2 g/dL (ref 6.5–8.1)

## 2022-03-03 MED ORDER — IOHEXOL 300 MG/ML  SOLN
75.0000 mL | Freq: Once | INTRAMUSCULAR | Status: AC | PRN
  Administered 2022-03-03: 75 mL via INTRAVENOUS

## 2022-03-03 NOTE — Progress Notes (Signed)
Chaplain engaged in an initial visit with [name] and her son.  Chaplain introduced herself and her role.  They didn't have any needs at this time.  [name] was looking forward to beginning her infusions when I arrived.    03/03/22 1100  Clinical Encounter Type  Visited With Patient  Visit Type Initial

## 2022-03-06 ENCOUNTER — Encounter: Payer: Self-pay | Admitting: *Deleted

## 2022-03-06 NOTE — Progress Notes (Signed)
CRITICAL VALUE STICKER  CRITICAL VALUE:  Abnormal CT Chest  RECEIVER (on-site recipient of call):  Bertice Risse Reynold Bowen, RN  DATE & TIME NOTIFIED:  03/06/22 @ 11:46 AM  MESSENGER (representative from Radiology): Manuela Schwartz  MD NOTIFIED: 03/06/22   TIME OF NOTIFICATION:  11:46 AM  RESPONSE:  No new orders at this time.  Able to visualize results.

## 2022-03-09 ENCOUNTER — Inpatient Hospital Stay (HOSPITAL_BASED_OUTPATIENT_CLINIC_OR_DEPARTMENT_OTHER): Payer: Medicare HMO | Admitting: Hematology

## 2022-03-09 VITALS — BP 107/53 | HR 73 | Temp 98.5°F | Resp 18 | Ht 63.0 in | Wt 191.2 lb

## 2022-03-09 DIAGNOSIS — R911 Solitary pulmonary nodule: Secondary | ICD-10-CM | POA: Diagnosis not present

## 2022-03-09 DIAGNOSIS — R69 Illness, unspecified: Secondary | ICD-10-CM | POA: Diagnosis not present

## 2022-03-09 DIAGNOSIS — Z809 Family history of malignant neoplasm, unspecified: Secondary | ICD-10-CM | POA: Diagnosis not present

## 2022-03-09 NOTE — Patient Instructions (Addendum)
[place] at [facility] Discharge Instructions   You were seen and examined today by Dr. Delton Coombes.  He reviewed the results of your CT scan. The spot in the right upper part of your lung is unchanged from your last scan.   He discussed with you your options for treatment. We can continue to watch this with scans or have the lung doctor do a biopsy. You have chosen to watch.  We will see you back in one year. We will repeat a CT scan prior to your next visit.    Thank you for choosing [place] at [facility] to provide your oncology and hematology care.  To afford each patient quality time with our provider, please arrive at least 15 minutes before your scheduled appointment time.   If you have a lab appointment with the [facility] please come in thru the Main Entrance and check in at the main information desk.  You need to re-schedule your appointment should you arrive 10 or more minutes late.  We strive to give you quality time with our providers, and arriving late affects you and other patients whose appointments are after yours.  Also, if you no show three or more times for appointments you may be dismissed from the clinic at the providers discretion.     Again, thank you for choosing [facility].  Our hope is that these requests will decrease the amount of time that you wait before being seen by our physicians.       _____________________________________________________________  Should you have questions after your visit to [facility], please contact our office at [phone] and follow the prompts.  Our office hours are 8:00 a.m. and 4:30 p.m. Monday - Friday.  Please note that voicemails left after 4:00 p.m. may not be returned until the following business day.  We are closed weekends and major holidays.  You do have access to a nurse 24-7, just call the main number to the clinic [phone] and do not  press any options, hold on the line and a nurse will answer the phone.    For prescription refill requests, have your pharmacy contact our office and allow 72 hours.    Due to Covid, you will need to wear a mask upon entering the hospital. If you do not have a mask, a mask will be given to you at the Main Entrance upon arrival. For doctor visits, patients may have 1 support person age 42 or older with them. For treatment visits, patients can not have anyone with them due to social distancing guidelines and our immunocompromised population.

## 2022-03-09 NOTE — Progress Notes (Signed)
[name], [place] [address]   CLINIC:  Medical Oncology/Hematology  PCP:  Celene Squibb, MD [name] [address] [phone]   REASON FOR VISIT:  Follow-up for enlarging right upper lobe pulmonary nodule  PRIOR THERAPY: none  NGS Results: not done  CURRENT THERAPY: under work-up  BRIEF ONCOLOGIC HISTORY:  Oncology History   No history exists.    CANCER STAGING:  Cancer Staging  No matching staging information was found for the patient.  INTERVAL HISTORY:  [name], a 81 y.o. female, seen for follow-up of right pulmonary lung nodule.  Denies any recent infections like pneumonia or COVID.  No upper respiratory infections.  No fevers or night sweats.  Reports 9 pound weight loss in the last 6 months despite having a good appetite.  She is current active smoker, 1 pack/ lasting 2 weeks.  She is accompanied by her daughter-in-law today.  REVIEW OF SYSTEMS:  Review of Systems  Constitutional:  Negative for appetite change and fatigue.  Respiratory:  Positive for cough and shortness of breath.   Gastrointestinal:  Positive for constipation.  Musculoskeletal:  Positive for arthralgias (8/10 L leg).  Skin:  Negative for rash.  Neurological:  Positive for dizziness.  All other systems reviewed and are negative.   PAST MEDICAL/SURGICAL HISTORY:  Past Medical History:  Diagnosis Date   Abnormal heart rhythms    Allergic rhinitis    Anxiety    Arthritis    Bronchitis    Chest wall pain    ANTERIOR   Depression    Dysuria    Eustachian tube dysfunction    Gallstones    Gallstones    GERD (gastroesophageal reflux disease)    Hyperlipidemia    Hypertension    Hypothyroidism    Hypoxemia    Incontinence    Liver function study, abnormal    Malaise and fatigue    Microscopic hematuria    Osteopenia    Personal history of colonic polyps    Primary breast malignancy ([place])    Snoring    Tobacco  abuse    Ulcer    Past Surgical History:  Procedure Laterality Date   BILATERAL MASTECTOMY     COLONOSCOPY  4/07   HER:DEYCXKG internal hemorrhoids otherwise normal   COLONOSCOPY  05/25/2010   YJE:HUDJSHFW hemorrhoids, otherwise normal rectum and colon.(POOR PREP). Due for surveillance 2017.    COLONOSCOPY  04/22/2002     RMR: Internal hemorrhoids, otherwise normal rectum  Polyps in the left colon resected with snare   COLONOSCOPY N/A 11/30/2017   Procedure: COLONOSCOPY;  Surgeon: Daneil Dolin, MD;  [facility];  Service: Endoscopy;  Laterality: N/A;  12:00pm   ESOPHAGOGASTRODUODENOSCOPY  05/25/2010   RMR:EGD distal esophageal erosions consistent with mild erosive reflux esophagitis, otherwise unremarkable esophagus status post passage of a 56-French Maloney dilator/Small hiatal hernia/Gastric ulcer with negative H.pylori. Overdue for surveillance.    ESOPHAGOGASTRODUODENOSCOPY N/A 03/10/2013   Dr. Gala Romney: Schatzki's ring s/p dilation, erosive reflux esophagitis, hiatal hernia, gastric erosions, negative h.pylori   ESOPHAGOGASTRODUODENOSCOPY N/A 11/30/2017   Procedure: ESOPHAGOGASTRODUODENOSCOPY (EGD);  Surgeon: Daneil Dolin, MD;  [facility];  Service: Endoscopy;  Laterality: N/A;   LEFT LEG SURGERY     MULTIPLE DUE TO FX   LEFT RTC TEAR     MALONEY DILATION N/A 03/10/2013   Procedure: Venia Minks DILATION;  Surgeon: Daneil Dolin, MD;  [facility];  Service: Endoscopy;  Laterality: N/A;   MALONEY DILATION N/A 11/30/2017   Procedure: Venia Minks DILATION;  Surgeon: Daneil Dolin, MD;  [facility];  Service: Endoscopy;  Laterality: N/A;   SAVORY DILATION N/A 03/10/2013   Procedure: SAVORY DILATION;  Surgeon: Daneil Dolin, MD;  [facility];  Service: Endoscopy;  Laterality: N/A;   TONSILECTOMY, ADENOIDECTOMY, BILATERAL MYRINGOTOMY AND TUBES     TONSILLECTOMY     VESICOVAGINAL FISTULA CLOSURE W/ TAH     excessive bleeding-benign     SOCIAL HISTORY:  Social History   Socioeconomic History   Marital status: Single    Spouse name: Not on file   Number of children: Not on file   Years of education: Not on file   Highest education level: Not on file  Occupational History   Occupation: retired  Tobacco Use   Smoking status: Every Day    Packs/day: 0.25    Years: 25.00    Total pack years: 6.25    Types: Cigarettes   Smokeless tobacco: Never   Tobacco comments:    Smokes a pack of cigarettes about every 2-3 days  Vaping Use   Vaping Use: Never used  Substance and Sexual Activity   Alcohol use: No   Drug use: No   Sexual activity: Not Currently  Other Topics Concern   Not on file  Social History Narrative   Not on file   Social Determinants of Health   Financial Resource Strain: Not on file  Food Insecurity: Not on file  Transportation Needs: Not on file  Physical Activity: Not on file  Stress: Not on file  Social Connections: Not on file  Intimate Partner Violence: Not on file    FAMILY HISTORY:  Family History  Problem Relation Age of Onset   Cancer Mother    Heart disease Father    Heart disease Brother    Cancer Sister    Aneurysm Sister    Colon cancer Neg Hx     CURRENT MEDICATIONS:  Current Outpatient Medications  Medication Sig Dispense Refill   acetaminophen (TYLENOL) 500 MG tablet Take 500-1,000 mg by mouth daily as needed (for arthritis pain.).      amLODipine (NORVASC) 10 MG tablet Take 10 mg by mouth daily.       aspirin EC 81 MG tablet Take 81 mg by mouth daily.     atorvastatin (LIPITOR) 40 MG tablet Take 40 mg by mouth daily.     carvedilol (COREG) 3.125 MG tablet Take 3.125 mg by mouth 2 (two) times daily with a meal.     Cholecalciferol (VITAMIN D3) 1.25 MG (50000 UT) CAPS Take 1,000 mcg by mouth.     fexofenadine (ALLEGRA) 180 MG tablet Take 180 mg by mouth daily.     hydrochlorothiazide (HYDRODIURIL) 25 MG tablet Take 25 mg by mouth daily.     levothyroxine  (SYNTHROID, LEVOTHROID) 137 MCG tablet Take 137 mcg by mouth daily before breakfast.   5   lisinopril (ZESTRIL) 40 MG tablet Take 40 mg by mouth daily.     vitamin B-12 (CYANOCOBALAMIN) 1000 MCG tablet Take 1,000 mcg by mouth daily.     No current facility-administered medications for this visit.    ALLERGIES:  Allergies  Allergen Reactions   Sulfonamide Derivatives Anaphylaxis    REACTION: "Tongue swells up"    PHYSICAL EXAM:  Performance status (ECOG): 1 - Symptomatic but completely ambulatory  There were no vitals filed  for this visit. Wt Readings from Last 3 Encounters:  09/06/21 197 lb 1.6 oz (89.4 kg)  08/23/21 197 lb 8.5 oz (89.6 kg)  11/30/17 200 lb (90.7 kg)   Physical Exam Vitals reviewed.  Constitutional:      Appearance: Normal appearance.  Cardiovascular:     Rate and Rhythm: Normal rate and regular rhythm.     Pulses: Normal pulses.     Heart sounds: Normal heart sounds.  Pulmonary:     Effort: Pulmonary effort is normal.     Breath sounds: Normal breath sounds.  Abdominal:     General: A surgical scar is present.     Palpations: There is no mass.     Tenderness: There is no abdominal tenderness.  Neurological:     General: No focal deficit present.     Mental Status: She is alert and oriented to person, place, and time.  Psychiatric:        Mood and Affect: Mood normal.        Behavior: Behavior normal.     LABORATORY DATA:  I have reviewed the labs as listed.     Latest Ref Rng & Units 03/03/2022    8:59 AM 02/28/2013   12:00 AM 08/11/2011   11:41 AM  CBC  WBC 4.0 - 10.5 K/uL 9.2   7.5   Hemoglobin 12.0 - 15.0 g/dL 13.3  13.7     13.2   Hematocrit 36.0 - 46.0 % 39.6  40     39.9   Platelets 150 - 400 K/uL 275  286     264      This result is from an external source.       Latest Ref Rng & Units 03/03/2022    8:59 AM 02/28/2013   12:00 AM 08/11/2011   11:41 AM  CMP  Glucose 70 - 99 mg/dL 113   96   BUN 8 - 23 mg/dL 19   14   Creatinine  0.44 - 1.00 mg/dL 0.76   0.52   Sodium 135 - 145 mmol/L 135   138   Potassium 3.5 - 5.1 mmol/L 4.2   3.6   Chloride 98 - 111 mmol/L 99   100   CO2 22 - 32 mmol/L 27   30   Calcium 8.9 - 10.3 mg/dL 9.5   9.8   Total Protein 6.5 - 8.1 g/dL 7.2   6.9   Total Bilirubin 0.3 - 1.2 mg/dL 0.7  0.4     0.4   Alkaline Phos 38 - 126 U/L 95   99   AST 15 - 41 U/L '18  27     20   '$ ALT 0 - 44 U/L 19  42     24      This result is from an external source.     DIAGNOSTIC IMAGING:  I have independently reviewed the scans and discussed with the patient. CT Chest W Contrast  Result Date: 03/06/2022 CLINICAL DATA:  Follow-up pulmonary nodules. Breast cancer. * Tracking Code: BO * EXAM: CT CHEST WITH CONTRAST TECHNIQUE: Multidetector CT imaging of the chest was performed during intravenous contrast administration. RADIATION DOSE REDUCTION: This exam was performed according to the departmental dose-optimization program which includes automated exposure control, adjustment of the mA and/or kV according to patient size and/or use of iterative reconstruction technique. CONTRAST:  61m OMNIPAQUE IOHEXOL 300 MG/ML  SOLN COMPARISON:  08/05/2021 chest CT.  09/01/2021 PET-CT. FINDINGS: Cardiovascular: Normal heart size. Chronic  small pericardial effusion, stable. Three-vessel coronary atherosclerosis. Atherosclerotic nonaneurysmal thoracic aorta. Normal caliber pulmonary arteries. No central pulmonary emboli. Mediastinum/Nodes: No significant thyroid nodules. Small fluid level in the midthoracic esophagus. Surgical clips in the right axilla. No axillary adenopathy. Lungs/Pleura: No pneumothorax. No pleural effusion. Ground-glass 3.0 x 2.4 cm posterior right upper lobe lung mass (series 4/image 50), without measurable solid component with similar small linear internal density, previously 3.0 x 2.4 cm on 08/05/2021 chest CT using similar measurement technique, unchanged, mildly increased from 2.8 x 1.6 cm on 10/27/2016 chest  CT. Solid 1.0 cm posterior left upper lobe pulmonary nodule (series 4/image 65), unchanged since 08/05/2021 and not substantially changed from 0.9 cm on 10/27/2016 CT. No acute consolidative airspace disease or new significant pulmonary nodules. Stable parenchymal banding at the peripheral left lung base. Upper abdomen: Dilated common bile duct (12 mm) with mild-to-moderate diffuse intrahepatic biliary ductal dilatation. Cholelithiasis. Right adrenal 1.6 cm nodule with density 57 HU, stable size, compatible with adenoma as previously diagnosed on noncontrast CT. Musculoskeletal: No aggressive appearing focal osseous lesions. Moderate thoracic spondylosis. Bilateral mastectomy. IMPRESSION: 1. Ground-glass 3.0 cm posterior right upper lobe lung mass, without measurable solid component, unchanged, mildly increased from remote 10/27/2016 chest CT. Recommend attention on follow-up chest CT in 12 months. 2. No thoracic adenopathy or other findings of metastatic disease in the chest. 3. Three-vessel coronary atherosclerosis. 4. Chronic small pericardial effusion. 5. Cholelithiasis. Dilated common bile duct with mild-to-moderate diffuse intrahepatic biliary ductal dilatation. Suggest correlation with serum bilirubin levels. MRI abdomen with MRCP without and with IV contrast may be considered for further evaluation as clinically warranted. 6. Small fluid level in the midthoracic esophagus, suggesting esophageal dysmotility and/or gastroesophageal reflux. 7. Stable right adrenal adenoma, for which no follow-up imaging is recommended. 8.  Aortic Atherosclerosis (ICD10-I70.0). These results will be called to the ordering clinician or representative by the Radiologist Assistant, and communication documented in the PACS or [employer]. Electronically Signed   By: Ilona Sorrel M.D.   On: 03/06/2022 11:00     ASSESSMENT:  Enlarging right upper lobe pulmonary nodule: - Patient seen at the request of Dr. Lavone Neri for enlarging  right lung nodule. - CT chest without contrast (08/05/2021): Enlarging groundglass nodule in the right upper lobe with possible linear solid component.  Measures 1.7 x 2.9 x 2.4 cm, previously 1.8 x 2.6 x 1.4 cm.  This was compared to CT scan from July 2018. - She denies any change in chronic cough.  No recent infections.  No hemoptysis.  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 - Stage I left breast invasive lobular carcinoma, mastectomy on 02/16/1998, status post tamoxifen for 5 years completed in November 2004.    Social/family history: - She lives by herself at home.  She is retired from Scientist, research (medical) work.  No chemical exposure or asbestos exposure.  She is current active smoker, smokes 2 packs/week for the last 11 years. - Brother had lung cancer.  Mother had breast cancer.  Sister had breast cancer.  Paternal aunt had melanoma.  Paternal first cousin had thyroid and breast cancer.  Nephew had glioblastoma multiform a.   PLAN:  Enlarging right upper lobe pulmonary nodule: -PET scan (09/01/2021): Groundglass nodule 2.6 x 1.8 cm SUV 1.13 with no adenopathy. - CT chest (03/03/2022): Groundglass 3 x 2.4 cm posterior right upper lobe lung mass without a measurable solid component  similar to scan from April 2023.  Mildly increased from 2.8 x 1.6 cm from 10/27/2016.  No thoracic adenopathy. - We discussed option of referral to CTS.  She is not interested in any surgery. - We discussed option of biopsy and its risk with pneumothorax.  She is not interested in biopsy at this time. - We also discussed radiation therapy to the lesion if it continues to grow.  She is agreeable to the option. - Based on slow growth, and her smoking history (1 pack every 2 weeks started at age 57) differential diagnosis includes low-grade adenocarcinoma. - Based on very slow growth on  the recent imaging, I have recommended CT chest with contrast in 12 months. - She will call as should she develop any new symptoms.    Orders placed this encounter:  No orders of the defined types were placed in this encounter.    Derek Jack, MD [place] [phone]

## 2022-03-15 DIAGNOSIS — M1712 Unilateral primary osteoarthritis, left knee: Secondary | ICD-10-CM | POA: Diagnosis not present

## 2022-03-24 DIAGNOSIS — M1712 Unilateral primary osteoarthritis, left knee: Secondary | ICD-10-CM | POA: Diagnosis not present

## 2022-03-30 DIAGNOSIS — M1712 Unilateral primary osteoarthritis, left knee: Secondary | ICD-10-CM | POA: Diagnosis not present

## 2022-05-12 DIAGNOSIS — E039 Hypothyroidism, unspecified: Secondary | ICD-10-CM | POA: Diagnosis not present

## 2022-05-12 DIAGNOSIS — E785 Hyperlipidemia, unspecified: Secondary | ICD-10-CM | POA: Diagnosis not present

## 2022-05-12 DIAGNOSIS — R7301 Impaired fasting glucose: Secondary | ICD-10-CM | POA: Diagnosis not present

## 2022-05-19 DIAGNOSIS — R7301 Impaired fasting glucose: Secondary | ICD-10-CM | POA: Diagnosis not present

## 2022-05-19 DIAGNOSIS — E785 Hyperlipidemia, unspecified: Secondary | ICD-10-CM | POA: Diagnosis not present

## 2022-05-19 DIAGNOSIS — M25562 Pain in left knee: Secondary | ICD-10-CM | POA: Diagnosis not present

## 2022-05-19 DIAGNOSIS — E039 Hypothyroidism, unspecified: Secondary | ICD-10-CM | POA: Diagnosis not present

## 2022-05-19 DIAGNOSIS — R634 Abnormal weight loss: Secondary | ICD-10-CM | POA: Diagnosis not present

## 2022-05-19 DIAGNOSIS — E538 Deficiency of other specified B group vitamins: Secondary | ICD-10-CM | POA: Diagnosis not present

## 2022-05-19 DIAGNOSIS — J302 Other seasonal allergic rhinitis: Secondary | ICD-10-CM | POA: Diagnosis not present

## 2022-05-19 DIAGNOSIS — R911 Solitary pulmonary nodule: Secondary | ICD-10-CM | POA: Diagnosis not present

## 2022-05-19 DIAGNOSIS — Z0001 Encounter for general adult medical examination with abnormal findings: Secondary | ICD-10-CM | POA: Diagnosis not present

## 2022-05-19 DIAGNOSIS — Z0000 Encounter for general adult medical examination without abnormal findings: Secondary | ICD-10-CM | POA: Diagnosis not present

## 2022-05-19 DIAGNOSIS — M810 Age-related osteoporosis without current pathological fracture: Secondary | ICD-10-CM | POA: Diagnosis not present

## 2022-05-29 ENCOUNTER — Encounter: Payer: Self-pay | Admitting: Hematology

## 2022-05-29 ENCOUNTER — Inpatient Hospital Stay: Payer: Medicare HMO | Attending: Hematology | Admitting: Hematology

## 2022-05-29 VITALS — BP 149/70 | HR 70 | Temp 98.0°F | Resp 18 | Wt 189.4 lb

## 2022-05-29 DIAGNOSIS — R059 Cough, unspecified: Secondary | ICD-10-CM | POA: Diagnosis not present

## 2022-05-29 DIAGNOSIS — Z853 Personal history of malignant neoplasm of breast: Secondary | ICD-10-CM | POA: Diagnosis not present

## 2022-05-29 DIAGNOSIS — F17210 Nicotine dependence, cigarettes, uncomplicated: Secondary | ICD-10-CM | POA: Diagnosis not present

## 2022-05-29 DIAGNOSIS — R911 Solitary pulmonary nodule: Secondary | ICD-10-CM | POA: Diagnosis not present

## 2022-05-29 DIAGNOSIS — R634 Abnormal weight loss: Secondary | ICD-10-CM | POA: Diagnosis not present

## 2022-05-29 DIAGNOSIS — R69 Illness, unspecified: Secondary | ICD-10-CM | POA: Diagnosis not present

## 2022-05-29 NOTE — Progress Notes (Signed)
[name], [place] [address]   CLINIC:  Medical Oncology/Hematology  PCP:  Celene Squibb, MD [name] [place] [address] [phone]   REASON FOR VISIT:  Follow-up for enlarging right upper lobe pulmonary nodule  PRIOR THERAPY: none  NGS Results: not done  CURRENT THERAPY: Observation  BRIEF ONCOLOGIC HISTORY:  Oncology History   No history exists.    CANCER STAGING:  Cancer Staging  No matching staging information was found for the patient.  INTERVAL HISTORY:  [name], a 82 y.o. female, seen for follow-up of right pulmonary lung nodule. She was last seen by me on 03/09/22.  Today, she states that she is doing great overall. Her appetite level is at 100% at this time but fluctuates. Her energy level is at 75%. She states that she was seen by her PCP on 05/19/22 and her weight had dropped to 181lbs, so she was advised to follow up with me for weight loss. Today, her weight is 189lbs. She reports intermittent constipation for x2 weeks for which she has started fiber supplementation x5 days ago. She has increased her intake of vegetables and fruit but denies any other dietary changes. She denies any illnesses or health changes since our last visit.  She reports a mild, unchanging cough in the mornings. This resolves after drinking water. She denies any new respiratory symptoms or hemoptysis.    REVIEW OF SYSTEMS:  Review of Systems  Constitutional:  Positive for fatigue. Negative for chills and fever.  HENT:   Positive for trouble swallowing. Negative for lump/mass, mouth sores, nosebleeds and sore throat.   Eyes:  Negative for eye problems.  Respiratory:  Positive for cough and shortness of breath.   Cardiovascular:  Negative for chest pain, leg swelling and palpitations.  Gastrointestinal:  Positive for constipation. Negative for abdominal pain, diarrhea, nausea and vomiting.  Genitourinary:  Negative for  bladder incontinence, difficulty urinating, dysuria, frequency, hematuria and nocturia.   Musculoskeletal:  Negative for arthralgias, back pain, flank pain, myalgias and neck pain.  Skin:  Negative for itching and rash.  Neurological:  Positive for numbness (left foot). Negative for dizziness and headaches.  Hematological:  Does not bruise/bleed easily.  Psychiatric/Behavioral:  Negative for depression, sleep disturbance and suicidal ideas. The patient is not nervous/anxious.   All other systems reviewed and are negative.   PAST MEDICAL/SURGICAL HISTORY:  Past Medical History:  Diagnosis Date   Abnormal heart rhythms    Allergic rhinitis    Anxiety    Arthritis    Bronchitis    Chest wall pain    ANTERIOR   Depression    Dysuria    Eustachian tube dysfunction    Gallstones    Gallstones    GERD (gastroesophageal reflux disease)    Hyperlipidemia    Hypertension    Hypothyroidism    Hypoxemia    Incontinence    Liver function study, abnormal    Malaise and fatigue    Microscopic hematuria    Osteopenia    Personal history of colonic polyps    Primary breast malignancy ([place])    Snoring    Tobacco abuse    Ulcer    Past Surgical History:  Procedure Laterality Date   BILATERAL MASTECTOMY     COLONOSCOPY  4/07   UMP:NTIRWER internal hemorrhoids otherwise normal   COLONOSCOPY  05/25/2010   XVQ:MGQQPYPP hemorrhoids, otherwise normal rectum and colon.(POOR PREP). Due for  surveillance 2017.    COLONOSCOPY  04/22/2002     RMR: Internal hemorrhoids, otherwise normal rectum  Polyps in the left colon resected with snare   COLONOSCOPY N/A 11/30/2017   Procedure: COLONOSCOPY;  Surgeon: Daneil Dolin, MD;  [facility]: [facility];  Service: Endoscopy;  Laterality: N/A;  12:00pm   ESOPHAGOGASTRODUODENOSCOPY  05/25/2010   RMR:EGD distal esophageal erosions consistent with mild erosive reflux esophagitis, otherwise unremarkable esophagus status post passage of a 56-French Maloney  dilator/Small hiatal hernia/Gastric ulcer with negative H.pylori. Overdue for surveillance.    ESOPHAGOGASTRODUODENOSCOPY N/A 03/10/2013   Dr. Gala Romney: Schatzki's ring s/p dilation, erosive reflux esophagitis, hiatal hernia, gastric erosions, negative h.pylori   ESOPHAGOGASTRODUODENOSCOPY N/A 11/30/2017   Procedure: ESOPHAGOGASTRODUODENOSCOPY (EGD);  Surgeon: Daneil Dolin, MD;  [facility];  Service: Endoscopy;  Laterality: N/A;   LEFT LEG SURGERY     MULTIPLE DUE TO FX   LEFT RTC TEAR     MALONEY DILATION N/A 03/10/2013   Procedure: Venia Minks DILATION;  Surgeon: Daneil Dolin, MD;  [facility];  Service: Endoscopy;  Laterality: N/A;   MALONEY DILATION N/A 11/30/2017   Procedure: Venia Minks DILATION;  Surgeon: Daneil Dolin, MD;  [facility];  Service: Endoscopy;  Laterality: N/A;   SAVORY DILATION N/A 03/10/2013   Procedure: SAVORY DILATION;  Surgeon: Daneil Dolin, MD;  [facility];  Service: Endoscopy;  Laterality: N/A;   TONSILECTOMY, ADENOIDECTOMY, BILATERAL MYRINGOTOMY AND TUBES     TONSILLECTOMY     VESICOVAGINAL FISTULA CLOSURE W/ TAH     excessive bleeding-benign    SOCIAL HISTORY:  Social History   Socioeconomic History   Marital status: Single    Spouse name: Not on file   Number of children: Not on file   Years of education: Not on file   Highest education level: Not on file  Occupational History   Occupation: retired  Tobacco Use   Smoking status: Every Day    Packs/day: 0.25    Years: 25.00    Total pack years: 6.25    Types: Cigarettes   Smokeless tobacco: Never   Tobacco comments:    Smokes a pack of cigarettes about every 2-3 days  Vaping Use   Vaping Use: Never used  Substance and Sexual Activity   Alcohol use: No   Drug use: No   Sexual activity: Not Currently  Other Topics Concern   Not on file  Social History Narrative   Not on file   Social Determinants of Health   Financial Resource Strain: Not on  file  Food Insecurity: Not on file  Transportation Needs: Not on file  Physical Activity: Not on file  Stress: Not on file  Social Connections: Not on file  Intimate Partner Violence: Not on file    FAMILY HISTORY:  Family History  Problem Relation Age of Onset   Cancer Mother    Heart disease Father    Heart disease Brother    Cancer Sister    Aneurysm Sister    Colon cancer Neg Hx     CURRENT MEDICATIONS:  Current Outpatient Medications  Medication Sig Dispense Refill   amLODipine (NORVASC) 10 MG tablet Take 10 mg by mouth daily.       aspirin EC 81 MG tablet Take 81 mg by mouth daily.     atorvastatin (LIPITOR) 40 MG tablet Take 40 mg by mouth daily.     carvedilol (COREG) 3.125 MG tablet Take 3.125  mg by mouth 2 (two) times daily with a meal.     Cholecalciferol (VITAMIN D3) 1.25 MG (50000 UT) CAPS Take 1,000 mcg by mouth.     clonazePAM (KLONOPIN) 0.5 MG tablet Take 1 tablet by mouth daily as needed.     diazepam (VALIUM) 5 MG tablet TAKE 1 TABLET BY MOUTH ONCE FOR 1 DOSE. TAKE 1 HOUR PRIOR TO SCAN.     fexofenadine (ALLEGRA) 180 MG tablet Take 180 mg by mouth daily.     hydrochlorothiazide (HYDRODIURIL) 25 MG tablet Take 25 mg by mouth daily.     levothyroxine (SYNTHROID, LEVOTHROID) 137 MCG tablet Take 137 mcg by mouth daily before breakfast.   5   lisinopril (ZESTRIL) 40 MG tablet Take 40 mg by mouth daily.     pantoprazole (PROTONIX) 40 MG tablet Take 1 tablet by mouth daily.     vitamin B-12 (CYANOCOBALAMIN) 1000 MCG tablet Take 1,000 mcg by mouth daily.     acetaminophen (TYLENOL) 500 MG tablet Take 500-1,000 mg by mouth daily as needed (for arthritis pain.).  (Patient not taking: Reported on 05/29/2022)     No current facility-administered medications for this visit.    ALLERGIES:  Allergies  Allergen Reactions   Sulfonamide Derivatives Anaphylaxis    PHYSICAL EXAM:  Performance status (ECOG): 1 - Symptomatic but completely ambulatory  Vitals:   05/29/22  0804  BP: (!) 149/70  Pulse: 70  Resp: 18  Temp: 98 F (36.7 C)  SpO2: 97%   Wt Readings from Last 3 Encounters:  05/29/22 85.9 kg (189 lb 6 oz)  03/09/22 86.7 kg (191 lb 3.2 oz)  09/06/21 89.4 kg (197 lb 1.6 oz)   Physical Exam Vitals and nursing note reviewed. Exam conducted with a chaperone present.  Constitutional:      Appearance: Normal appearance.  Cardiovascular:     Rate and Rhythm: Normal rate and regular rhythm.     Pulses: Normal pulses.     Heart sounds: Normal heart sounds.  Pulmonary:     Effort: Pulmonary effort is normal.     Breath sounds: Normal breath sounds.  Abdominal:     Palpations: Abdomen is soft. There is no hepatomegaly, splenomegaly or mass.     Tenderness: There is no abdominal tenderness.  Lymphadenopathy:     Upper Body:     Right upper body: No supraclavicular, axillary or pectoral adenopathy.     Left upper body: No supraclavicular, axillary or pectoral adenopathy.  Neurological:     General: No focal deficit present.     Mental Status: She is alert and oriented to person, place, and time.  Psychiatric:        Mood and Affect: Mood normal.        Behavior: Behavior normal.      LABORATORY DATA:  I have reviewed the labs as listed.     Latest Ref Rng & Units 03/03/2022    8:59 AM 02/28/2013   12:00 AM 08/11/2011   11:41 AM  CBC  WBC 4.0 - 10.5 K/uL 9.2   7.5   Hemoglobin 12.0 - 15.0 g/dL 13.3  13.7     13.2   Hematocrit 36.0 - 46.0 % 39.6  40     39.9   Platelets 150 - 400 K/uL 275  286     264      This result is from an external source.      Latest Ref Rng & Units 03/03/2022    8:59 AM  02/28/2013   12:00 AM 08/11/2011   11:41 AM  CMP  Glucose 70 - 99 mg/dL 113   96   BUN 8 - 23 mg/dL 19   14   Creatinine 0.44 - 1.00 mg/dL 0.76   0.52   Sodium 135 - 145 mmol/L 135   138   Potassium 3.5 - 5.1 mmol/L 4.2   3.6   Chloride 98 - 111 mmol/L 99   100   CO2 22 - 32 mmol/L 27   30   Calcium 8.9 - 10.3 mg/dL 9.5   9.8   Total  Protein 6.5 - 8.1 g/dL 7.2   6.9   Total Bilirubin 0.3 - 1.2 mg/dL 0.7  0.4     0.4   Alkaline Phos 38 - 126 U/L 95   99   AST 15 - 41 U/L '18  27     20   '$ ALT 0 - 44 U/L 19  42     24      This result is from an external source.    DIAGNOSTIC IMAGING:  I have independently reviewed the scans and discussed with the patient. No results found.   ASSESSMENT:  Enlarging right upper lobe pulmonary nodule: - Patient seen at the request of Dr. Lavone Neri for enlarging right lung nodule. - CT chest without contrast (08/05/2021): Enlarging groundglass nodule in the right upper lobe with possible linear solid component.  Measures 1.7 x 2.9 x 2.4 cm, previously 1.8 x 2.6 x 1.4 cm.  This was compared to CT scan from July 2018. - She denies any change in chronic cough.  No recent infections.  No hemoptysis. -PET scan (09/01/2021): Groundglass nodule 2.6 x 1.8 cm SUV 1.13 with no adenopathy. - CT chest (03/03/2022): Groundglass 3 x 2.4 cm posterior right upper lobe lung mass without a measurable solid component similar to scan from April 2023.  Mildly increased from 2.8 x 1.6 cm from 10/27/2016.  No thoracic adenopathy.  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 - Stage I left breast invasive lobular carcinoma, mastectomy on 02/16/1998, status post tamoxifen for 5 years completed in November 2004.    Social/family history: - She lives by herself at home.  She is retired from Scientist, research (medical) work.  No chemical exposure or asbestos exposure.  She is current active smoker, smokes 2 packs/week for the last 34 years. - Brother had lung cancer.  Mother had breast cancer.  Sister had breast cancer.  Paternal aunt had melanoma.  Paternal first cousin had thyroid and breast cancer.  Nephew had glioblastoma multiform a.   PLAN:  Enlarging right upper lobe pulmonary nodule: - She has  an enlarging groundglass 3 x 2.4 cm right upper lobe lesion on CT scans. - At last visit we have discussed options including surgery.  She was reluctant to consider surgery but was open to radiation.  We have agreed on watching.  Based on the slow growth and her smoking history (1 pack every 2 weeks started at age 53) differential diagnosis includes low-grade adenocarcinoma. - She was referred to our clinic as she has lost some weight.  She has lost about 2 pounds since last visit.  Her appetite is stable.  Her only new complaint is constipation.  I have recommended that she use Colace as needed. - I have recommended her to keep her appointment later this year.  I  will be glad to see her if there is any significant weight loss of 10% of her baseline body weight.    Orders placed this encounter:  No orders of the defined types were placed in this encounter.   I,Alexis Herring,acting as a Education administrator for [employer], MD.,have documented all relevant documentation on the behalf of Derek Jack, MD,as directed by  Derek Jack, MD while in the presence of Derek Jack, MD.  I, Derek Jack MD, have reviewed the above documentation for accuracy and completeness, and I agree with the above.    Derek Jack, MD [place] [phone]

## 2022-05-29 NOTE — Patient Instructions (Addendum)
[name]  Discharge Instructions  You were seen and examined today by Dr. Delton Coombes.  Dr. Nevada Crane asked that Dr. Delton Coombes see you due to weight loss. You have lost two pounds. This is very insignificant.  Start Colace daily for constipation.  Follow-up as scheduled.  Thank you for choosing [facility] to provide your oncology and hematology care.   To afford each patient quality time with our provider, please arrive at least 15 minutes before your scheduled appointment time. You may need to reschedule your appointment if you arrive late (10 or more minutes). Arriving late affects you and other patients whose appointments are after yours.  Also, if you miss three or more appointments without notifying the office, you may be dismissed from the clinic at the provider's discretion.    Again, thank you for choosing [facility].  Our hope is that these requests will decrease the amount of time that you wait before being seen by our physicians.   If you have a lab appointment with the [facility] please come in thru the Main Entrance and check in at the main information desk.           _____________________________________________________________  Should you have questions after your visit to [facility], please contact our office at [phone] and follow the prompts.  Our office hours are 8:00 a.m. to 4:30 p.m. Monday - Thursday and 8:00 a.m. to 2:30 p.m. Friday.  Please note that voicemails left after 4:00 p.m. may not be returned until the following business day.  We are closed weekends and all major holidays.  You do have access to a nurse 24-7, just call the main number to the clinic [phone] and do not press any options, hold on the line and a nurse will answer the phone.    For prescription refill requests, have your pharmacy contact our office and allow 72 hours.    Masks are optional in the  cancer centers. If you would like for your care team to wear a mask while they are taking care of you, please let them know. You may have one support person who is at least 80 years old accompany you for your appointments.

## 2022-06-02 DIAGNOSIS — K5904 Chronic idiopathic constipation: Secondary | ICD-10-CM | POA: Diagnosis not present

## 2022-08-21 DIAGNOSIS — E785 Hyperlipidemia, unspecified: Secondary | ICD-10-CM | POA: Diagnosis not present

## 2022-08-21 DIAGNOSIS — E039 Hypothyroidism, unspecified: Secondary | ICD-10-CM | POA: Diagnosis not present

## 2022-08-21 DIAGNOSIS — Z008 Encounter for other general examination: Secondary | ICD-10-CM | POA: Diagnosis not present

## 2022-08-21 DIAGNOSIS — I129 Hypertensive chronic kidney disease with stage 1 through stage 4 chronic kidney disease, or unspecified chronic kidney disease: Secondary | ICD-10-CM | POA: Diagnosis not present

## 2022-08-21 DIAGNOSIS — I252 Old myocardial infarction: Secondary | ICD-10-CM | POA: Diagnosis not present

## 2022-08-21 DIAGNOSIS — F3341 Major depressive disorder, recurrent, in partial remission: Secondary | ICD-10-CM | POA: Diagnosis not present

## 2022-08-21 DIAGNOSIS — K219 Gastro-esophageal reflux disease without esophagitis: Secondary | ICD-10-CM | POA: Diagnosis not present

## 2022-08-21 DIAGNOSIS — M1990 Unspecified osteoarthritis, unspecified site: Secondary | ICD-10-CM | POA: Diagnosis not present

## 2022-08-21 DIAGNOSIS — R32 Unspecified urinary incontinence: Secondary | ICD-10-CM | POA: Diagnosis not present

## 2022-08-21 DIAGNOSIS — N182 Chronic kidney disease, stage 2 (mild): Secondary | ICD-10-CM | POA: Diagnosis not present

## 2022-08-21 DIAGNOSIS — J45909 Unspecified asthma, uncomplicated: Secondary | ICD-10-CM | POA: Diagnosis not present

## 2022-08-21 DIAGNOSIS — F411 Generalized anxiety disorder: Secondary | ICD-10-CM | POA: Diagnosis not present

## 2022-08-21 DIAGNOSIS — E669 Obesity, unspecified: Secondary | ICD-10-CM | POA: Diagnosis not present

## 2022-10-03 ENCOUNTER — Other Ambulatory Visit (HOSPITAL_COMMUNITY): Payer: Self-pay | Admitting: Family Medicine

## 2022-10-03 DIAGNOSIS — R911 Solitary pulmonary nodule: Secondary | ICD-10-CM | POA: Diagnosis not present

## 2022-10-03 DIAGNOSIS — R062 Wheezing: Secondary | ICD-10-CM | POA: Diagnosis not present

## 2022-10-03 DIAGNOSIS — R0981 Nasal congestion: Secondary | ICD-10-CM | POA: Diagnosis not present

## 2022-10-03 DIAGNOSIS — R0600 Dyspnea, unspecified: Secondary | ICD-10-CM | POA: Diagnosis not present

## 2022-10-04 ENCOUNTER — Other Ambulatory Visit (HOSPITAL_COMMUNITY): Payer: Self-pay | Admitting: Respiratory Therapy

## 2022-10-04 DIAGNOSIS — R062 Wheezing: Secondary | ICD-10-CM

## 2022-10-05 ENCOUNTER — Ambulatory Visit (HOSPITAL_COMMUNITY)
Admission: RE | Admit: 2022-10-05 | Discharge: 2022-10-05 | Disposition: A | Discharge status: Home / Self Care | Payer: Medicare HMO | Source: Ambulatory Visit | Attending: Family Medicine | Admitting: Family Medicine

## 2022-10-05 DIAGNOSIS — R062 Wheezing: Secondary | ICD-10-CM | POA: Diagnosis not present

## 2022-11-13 DIAGNOSIS — R7301 Impaired fasting glucose: Secondary | ICD-10-CM | POA: Diagnosis not present

## 2022-11-13 DIAGNOSIS — E039 Hypothyroidism, unspecified: Secondary | ICD-10-CM | POA: Diagnosis not present

## 2022-11-13 DIAGNOSIS — E785 Hyperlipidemia, unspecified: Secondary | ICD-10-CM | POA: Diagnosis not present

## 2022-11-23 DIAGNOSIS — R911 Solitary pulmonary nodule: Secondary | ICD-10-CM | POA: Diagnosis not present

## 2022-11-23 DIAGNOSIS — R634 Abnormal weight loss: Secondary | ICD-10-CM | POA: Diagnosis not present

## 2022-11-23 DIAGNOSIS — R7301 Impaired fasting glucose: Secondary | ICD-10-CM | POA: Diagnosis not present

## 2022-11-23 DIAGNOSIS — J302 Other seasonal allergic rhinitis: Secondary | ICD-10-CM | POA: Diagnosis not present

## 2022-11-23 DIAGNOSIS — E785 Hyperlipidemia, unspecified: Secondary | ICD-10-CM | POA: Diagnosis not present

## 2022-11-23 DIAGNOSIS — I10 Essential (primary) hypertension: Secondary | ICD-10-CM | POA: Diagnosis not present

## 2022-11-23 DIAGNOSIS — M25562 Pain in left knee: Secondary | ICD-10-CM | POA: Diagnosis not present

## 2022-11-23 DIAGNOSIS — M810 Age-related osteoporosis without current pathological fracture: Secondary | ICD-10-CM | POA: Diagnosis not present

## 2022-11-23 DIAGNOSIS — E039 Hypothyroidism, unspecified: Secondary | ICD-10-CM | POA: Diagnosis not present

## 2022-11-23 DIAGNOSIS — E538 Deficiency of other specified B group vitamins: Secondary | ICD-10-CM | POA: Diagnosis not present

## 2022-11-23 DIAGNOSIS — Z0000 Encounter for general adult medical examination without abnormal findings: Secondary | ICD-10-CM | POA: Diagnosis not present

## 2022-11-23 DIAGNOSIS — H52223 Regular astigmatism, bilateral: Secondary | ICD-10-CM | POA: Diagnosis not present

## 2022-11-27 DIAGNOSIS — H52223 Regular astigmatism, bilateral: Secondary | ICD-10-CM | POA: Diagnosis not present

## 2023-01-02 DIAGNOSIS — Z85828 Personal history of other malignant neoplasm of skin: Secondary | ICD-10-CM | POA: Diagnosis not present

## 2023-01-02 DIAGNOSIS — L570 Actinic keratosis: Secondary | ICD-10-CM | POA: Diagnosis not present

## 2023-01-02 DIAGNOSIS — L720 Epidermal cyst: Secondary | ICD-10-CM | POA: Diagnosis not present

## 2023-01-02 DIAGNOSIS — L821 Other seborrheic keratosis: Secondary | ICD-10-CM | POA: Diagnosis not present

## 2023-01-02 DIAGNOSIS — L814 Other melanin hyperpigmentation: Secondary | ICD-10-CM | POA: Diagnosis not present

## 2023-01-02 DIAGNOSIS — D485 Neoplasm of uncertain behavior of skin: Secondary | ICD-10-CM | POA: Diagnosis not present

## 2023-02-28 ENCOUNTER — Other Ambulatory Visit: Payer: Self-pay

## 2023-02-28 DIAGNOSIS — R911 Solitary pulmonary nodule: Secondary | ICD-10-CM

## 2023-03-01 ENCOUNTER — Ambulatory Visit (HOSPITAL_COMMUNITY)
Admission: RE | Admit: 2023-03-01 | Discharge: 2023-03-01 | Disposition: A | Discharge status: Home / Self Care | Payer: Medicare HMO | Source: Ambulatory Visit | Attending: Hematology | Admitting: Hematology

## 2023-03-01 ENCOUNTER — Other Ambulatory Visit: Payer: Medicare HMO

## 2023-03-01 ENCOUNTER — Inpatient Hospital Stay: Payer: Medicare HMO | Attending: Hematology

## 2023-03-01 DIAGNOSIS — R918 Other nonspecific abnormal finding of lung field: Secondary | ICD-10-CM | POA: Diagnosis not present

## 2023-03-01 DIAGNOSIS — Z803 Family history of malignant neoplasm of breast: Secondary | ICD-10-CM | POA: Insufficient documentation

## 2023-03-01 DIAGNOSIS — F17210 Nicotine dependence, cigarettes, uncomplicated: Secondary | ICD-10-CM | POA: Insufficient documentation

## 2023-03-01 DIAGNOSIS — R911 Solitary pulmonary nodule: Secondary | ICD-10-CM | POA: Diagnosis not present

## 2023-03-01 DIAGNOSIS — Z853 Personal history of malignant neoplasm of breast: Secondary | ICD-10-CM | POA: Insufficient documentation

## 2023-03-01 DIAGNOSIS — Z808 Family history of malignant neoplasm of other organs or systems: Secondary | ICD-10-CM | POA: Insufficient documentation

## 2023-03-01 MED ORDER — IOHEXOL 300 MG/ML  SOLN
80.0000 mL | Freq: Once | INTRAMUSCULAR | Status: AC | PRN
  Administered 2023-03-01: 80 mL via INTRAVENOUS

## 2023-03-02 DIAGNOSIS — E039 Hypothyroidism, unspecified: Secondary | ICD-10-CM | POA: Diagnosis not present

## 2023-03-02 DIAGNOSIS — E785 Hyperlipidemia, unspecified: Secondary | ICD-10-CM | POA: Diagnosis not present

## 2023-03-02 DIAGNOSIS — R7301 Impaired fasting glucose: Secondary | ICD-10-CM | POA: Diagnosis not present

## 2023-03-08 ENCOUNTER — Inpatient Hospital Stay: Payer: Medicare HMO | Admitting: Hematology

## 2023-03-08 VITALS — BP 112/64 | HR 70 | Temp 97.8°F | Resp 19 | Wt 186.6 lb

## 2023-03-08 DIAGNOSIS — M810 Age-related osteoporosis without current pathological fracture: Secondary | ICD-10-CM | POA: Diagnosis not present

## 2023-03-08 DIAGNOSIS — Z808 Family history of malignant neoplasm of other organs or systems: Secondary | ICD-10-CM | POA: Diagnosis not present

## 2023-03-08 DIAGNOSIS — R7303 Prediabetes: Secondary | ICD-10-CM | POA: Diagnosis not present

## 2023-03-08 DIAGNOSIS — E039 Hypothyroidism, unspecified: Secondary | ICD-10-CM | POA: Diagnosis not present

## 2023-03-08 DIAGNOSIS — E538 Deficiency of other specified B group vitamins: Secondary | ICD-10-CM | POA: Diagnosis not present

## 2023-03-08 DIAGNOSIS — H6123 Impacted cerumen, bilateral: Secondary | ICD-10-CM | POA: Diagnosis not present

## 2023-03-08 DIAGNOSIS — R911 Solitary pulmonary nodule: Secondary | ICD-10-CM | POA: Diagnosis not present

## 2023-03-08 DIAGNOSIS — E785 Hyperlipidemia, unspecified: Secondary | ICD-10-CM | POA: Diagnosis not present

## 2023-03-08 DIAGNOSIS — M25562 Pain in left knee: Secondary | ICD-10-CM | POA: Diagnosis not present

## 2023-03-08 DIAGNOSIS — F17210 Nicotine dependence, cigarettes, uncomplicated: Secondary | ICD-10-CM | POA: Diagnosis not present

## 2023-03-08 DIAGNOSIS — Z853 Personal history of malignant neoplasm of breast: Secondary | ICD-10-CM | POA: Diagnosis not present

## 2023-03-08 DIAGNOSIS — R634 Abnormal weight loss: Secondary | ICD-10-CM | POA: Diagnosis not present

## 2023-03-08 DIAGNOSIS — Z23 Encounter for immunization: Secondary | ICD-10-CM | POA: Diagnosis not present

## 2023-03-08 DIAGNOSIS — Z803 Family history of malignant neoplasm of breast: Secondary | ICD-10-CM | POA: Diagnosis not present

## 2023-03-08 NOTE — Progress Notes (Signed)
[facility] [address], [place] [address]    Clinic Day:  03/08/2023  Referring physician: Benita Stabile, MD  Patient Care Team: Benita Stabile, MD as PCP - General (Internal Medicine) Doreatha Massed, MD as Medical Oncologist (Medical Oncology) Therese Sarah, RN as Oncology Nurse Navigator (Medical Oncology)   ASSESSMENT & PLAN:   Assessment: Enlarging right upper lobe pulmonary nodule: - Patient seen at the request of Dr. Providence Lanius for enlarging right lung nodule. - CT chest without contrast (08/05/2021): Enlarging groundglass nodule in the right upper lobe with possible linear solid component.  Measures 1.7 x 2.9 x 2.4 cm, previously 1.8 x 2.6 x 1.4 cm.  This was compared to CT scan from July 2018. - She denies any change in chronic cough.  No recent infections.  No hemoptysis. -PET scan (09/01/2021): Groundglass nodule 2.6 x 1.8 cm SUV 1.13 with no adenopathy. - CT chest (03/03/2022): Groundglass 3 x 2.4 cm posterior right upper lobe lung mass without a measurable solid component similar to scan from April 2023.  Mildly increased from 2.8 x 1.6 cm from 10/27/2016.  No thoracic adenopathy.   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 - Stage I left breast invasive lobular carcinoma, mastectomy on 02/16/1998, status post tamoxifen for 5 years completed in November 2004.    Social/family history: - She lives by herself at home.  She is retired from Engineering geologist work.  No chemical exposure or asbestos exposure.  She is current active smoker, smokes 2 packs/week for the last 53 years. - Brother had lung cancer.  Mother had breast cancer.  Sister had breast cancer.  Paternal aunt had melanoma.  Paternal first cousin had thyroid and breast cancer.  Nephew had glioblastoma multiform a.  Plan: Enlarging right upper lobe  pulmonary nodule: - She denies any cough or hemoptysis. - We reviewed CT chest from 03/01/2023: Stable groundglass nodule posteriorly in the right upper lobe without measurable solid component, minimally enlarged from 2018 but has been stable since the last scan. - She has previously refused radiation. - I have reviewed labs from Dr. Scharlene Gloss office dated 03/02/2023: Normal LFTs and renal function.  CBC was normal. - She will return back to the clinic in 1 year with repeat CT scan.  Orders Placed This Encounter  Procedures   CT CHEST W CONTRAST    Standing Status:   Future    Standing Expiration Date:   03/07/2024    Order Specific Question:   If indicated for the ordered procedure, I authorize the administration of contrast media per Radiology protocol    Answer:   Yes    Order Specific Question:   Does the patient have a contrast media/X-ray dye allergy?    Answer:   No    Order Specific Question:   Preferred imaging location?    Answer:   [facility]      I,Helena R Teague,acting as a scribe for Doreatha Massed, MD.,have documented all relevant documentation on the behalf of Doreatha Massed, MD,as directed by  Doreatha Massed, MD while in the presence of Doreatha Massed, MD.   I, Doreatha Massed MD, have reviewed the above documentation for accuracy and completeness, and I agree with the above.   Doreatha Massed, MD   11/14/20242:48 PM  CHIEF COMPLAINT:   Diagnosis: Nodule of upper lobe of right lung  Cancer Staging  No matching staging information was found for the patient.    Prior Therapy: none  Current Therapy:  observation   HISTORY OF PRESENT ILLNESS:   Oncology History   No history exists.     INTERVAL HISTORY:   Jakylah is a 82 y.o. female presenting to clinic today for follow up of right pulmonary lung nodule. She was last seen by me on 05/29/22.  Since her last visit, she underwent CT chest on 03/01/23 that found: stable  ground-glass nodule posteriorly in the right upper lobe without measurable solid component; no new or enlarging pulmonary nodules; and cholelithiasis with mild gallbladder wall thickening and persistent intra and extrahepatic biliary dilatation.  Today, she states that she is doing well overall. Her appetite level is at 80%. Her energy level is at 75%. She is accompanied by her daughter.   Her constipation has improved since last visit that she is treating by eating an apple with peanut butter in the afternoon. She states she has had gallstones for 15 years without issue.  She would like to remain under observation and is not electing to do surgery or radiation.  PAST MEDICAL HISTORY:   Past Medical History: Past Medical History:  Diagnosis Date   Abnormal heart rhythms    Allergic rhinitis    Anxiety    Arthritis    Bronchitis    Chest wall pain    ANTERIOR   Depression    Dysuria    Eustachian tube dysfunction    Gallstones    Gallstones    GERD (gastroesophageal reflux disease)    Hyperlipidemia    Hypertension    Hypothyroidism    Hypoxemia    Incontinence    Liver function study, abnormal    Malaise and fatigue    Microscopic hematuria    Osteopenia    Personal history of colonic polyps    Primary breast malignancy (HCC)    Snoring    Tobacco abuse    Ulcer     Surgical History: Past Surgical History:  Procedure Laterality Date   BILATERAL MASTECTOMY     COLONOSCOPY  4/07   ZDG:UYQIHKV internal hemorrhoids otherwise normal   COLONOSCOPY  05/25/2010   QQV:ZDGLOVFI hemorrhoids, otherwise normal rectum and colon.(POOR PREP). Due for surveillance 2017.    COLONOSCOPY  04/22/2002     RMR: Internal hemorrhoids, otherwise normal rectum  Polyps in the left colon resected with snare   COLONOSCOPY N/A 11/30/2017   Procedure: COLONOSCOPY;  Surgeon: Corbin Ade, MD;  [facility];  Service: Endoscopy;  Laterality: N/A;  12:00pm   ESOPHAGOGASTRODUODENOSCOPY   05/25/2010   RMR:EGD distal esophageal erosions consistent with mild erosive reflux esophagitis, otherwise unremarkable esophagus status post passage of a 56-French Maloney dilator/Small hiatal hernia/Gastric ulcer with negative H.pylori. Overdue for surveillance.    ESOPHAGOGASTRODUODENOSCOPY N/A 03/10/2013   Dr. Jena Gauss: Schatzki's ring s/p dilation, erosive reflux esophagitis, hiatal hernia, gastric erosions, negative h.pylori   ESOPHAGOGASTRODUODENOSCOPY N/A 11/30/2017   Procedure: ESOPHAGOGASTRODUODENOSCOPY (EGD);  Surgeon: Corbin Ade, MD;  [facility];  Service: Endoscopy;  Laterality: N/A;   LEFT LEG SURGERY     MULTIPLE DUE TO FX   LEFT RTC TEAR     MALONEY DILATION N/A 03/10/2013   Procedure: [name] DILATION;  Surgeon: Corbin Ade, MD;  [facility]: [facility];  Service: Endoscopy;  Laterality: N/A;   MALONEY DILATION N/A 11/30/2017   Procedure: [name] DILATION;  Surgeon: Corbin Ade, MD;  [facility];  Service: Endoscopy;  Laterality: N/A;   SAVORY DILATION N/A 03/10/2013   Procedure: SAVORY DILATION;  Surgeon: Corbin Ade, MD;  [facility];  Service: Endoscopy;  Laterality: N/A;   TONSILECTOMY, ADENOIDECTOMY, BILATERAL MYRINGOTOMY AND TUBES     TONSILLECTOMY     VESICOVAGINAL FISTULA CLOSURE W/ TAH     excessive bleeding-benign    Social History: Social History   Socioeconomic History   Marital status: Single    Spouse name: Not on file   Number of children: Not on file   Years of education: Not on file   Highest education level: Not on file  Occupational History   Occupation: retired  Tobacco Use   Smoking status: Every Day    Current packs/day: 0.25    Average packs/day: 0.3 packs/day for 25.0 years (6.3 ttl pk-yrs)    Types: Cigarettes   Smokeless tobacco: Never   Tobacco comments:    Smokes a pack of cigarettes about every 2-3 days  Vaping Use   Vaping status: Never Used  Substance and Sexual Activity   Alcohol  use: No   Drug use: No   Sexual activity: Not Currently  Other Topics Concern   Not on file  Social History Narrative   Not on file   Social Determinants of Health   Financial Resource Strain: Not on file  Food Insecurity: Not on file  Transportation Needs: Not on file  Physical Activity: Not on file  Stress: Not on file  Social Connections: Not on file  Intimate Partner Violence: Not on file    Family History: Family History  Problem Relation Age of Onset   Cancer Mother    Heart disease Father    Heart disease Brother    Cancer Sister    Aneurysm Sister    Colon cancer Neg Hx     Current Medications:  Current Outpatient Medications:    acetaminophen (TYLENOL) 500 MG tablet, Take 500-1,000 mg by mouth daily as needed (for arthritis pain.)., Disp: , Rfl:    amLODipine (NORVASC) 10 MG tablet, Take 10 mg by mouth daily.  , Disp: , Rfl:    aspirin EC 81 MG tablet, Take 81 mg by mouth daily., Disp: , Rfl:    atorvastatin (LIPITOR) 40 MG tablet, Take 40 mg by mouth daily., Disp: , Rfl:    carvedilol (COREG) 3.125 MG tablet, Take 3.125 mg by mouth 2 (two) times daily with a meal., Disp: , Rfl:    Cholecalciferol (VITAMIN D3) 1.25 MG (50000 UT) CAPS, Take 1,000 mcg by mouth., Disp: , Rfl:    clonazePAM (KLONOPIN) 0.5 MG tablet, Take 1 tablet by mouth daily as needed., Disp: , Rfl:    diazepam (VALIUM) 5 MG tablet, TAKE 1 TABLET BY MOUTH ONCE FOR 1 DOSE. TAKE 1 HOUR PRIOR TO SCAN., Disp: , Rfl:    fexofenadine (ALLEGRA) 180 MG tablet, Take 180 mg by mouth daily., Disp: , Rfl:    hydrochlorothiazide (HYDRODIURIL) 25 MG tablet, Take 25 mg by mouth daily., Disp: , Rfl:    levothyroxine (SYNTHROID, LEVOTHROID) 137 MCG tablet, Take 137 mcg by mouth daily before breakfast. , Disp: , Rfl: 5   lisinopril (ZESTRIL) 40 MG tablet, Take 40 mg by mouth daily., Disp: , Rfl:    pantoprazole (PROTONIX) 40 MG tablet, Take 1 tablet by mouth daily., Disp: , Rfl:    vitamin B-12 (CYANOCOBALAMIN)  1000 MCG tablet, Take 1,000 mcg by mouth daily., Disp: , Rfl:  Allergies: Allergies  Allergen Reactions   Sulfonamide Derivatives Anaphylaxis    REVIEW OF SYSTEMS:   Review of Systems  Constitutional:  Negative for chills, fatigue and fever.  HENT:   Positive for trouble swallowing. Negative for lump/mass, mouth sores, nosebleeds and sore throat.   Eyes:  Negative for eye problems.  Respiratory:  Positive for cough and shortness of breath.   Cardiovascular:  Negative for chest pain, leg swelling and palpitations.  Gastrointestinal:  Positive for constipation. Negative for abdominal pain, diarrhea, nausea and vomiting.  Genitourinary:  Negative for bladder incontinence, difficulty urinating, dysuria, frequency, hematuria and nocturia.   Musculoskeletal:  Negative for arthralgias, back pain, flank pain, myalgias and neck pain.  Skin:  Negative for itching and rash.  Neurological:  Negative for dizziness, headaches and numbness.       +tingling hands and feet  Hematological:  Does not bruise/bleed easily.  Psychiatric/Behavioral:  Negative for depression, sleep disturbance and suicidal ideas. The patient is not nervous/anxious.   All other systems reviewed and are negative.    VITALS:   Blood pressure 112/64, pulse 70, temperature 97.8 F (36.6 C), temperature source Oral, resp. rate 19, weight 186 lb 9.6 oz (84.6 kg), SpO2 98%.  Wt Readings from Last 3 Encounters:  03/08/23 186 lb 9.6 oz (84.6 kg)  05/29/22 189 lb 6 oz (85.9 kg)  03/09/22 191 lb 3.2 oz (86.7 kg)    Body mass index is 33.05 kg/m.  Performance status (ECOG): 1 - Symptomatic but completely ambulatory  PHYSICAL EXAM:   Physical Exam Vitals and nursing note reviewed. Exam conducted with a chaperone present.  Constitutional:      Appearance: Normal appearance.  Cardiovascular:     Rate and Rhythm: Normal rate and regular rhythm.     Pulses: Normal pulses.     Heart sounds: Normal heart sounds.  Pulmonary:      Effort: Pulmonary effort is normal.     Breath sounds: Normal breath sounds.  Abdominal:     Palpations: Abdomen is soft. There is no hepatomegaly, splenomegaly or mass.     Tenderness: There is no abdominal tenderness.  Musculoskeletal:     Right lower leg: No edema.     Left lower leg: No edema.  Lymphadenopathy:     Cervical: No cervical adenopathy.     Right cervical: No superficial, deep or posterior cervical adenopathy.    Left cervical: No superficial, deep or posterior cervical adenopathy.     Upper Body:     Right upper body: No supraclavicular or axillary adenopathy.     Left upper body: No supraclavicular or axillary adenopathy.  Neurological:     General: No focal deficit present.     Mental Status: She is alert and oriented to person, place, and time.  Psychiatric:        Mood and Affect: Mood normal.        Behavior: Behavior normal.     LABS:      Latest Ref Rng & Units 03/03/2022    8:59 AM 02/28/2013   12:00 AM 08/11/2011   11:41 AM  CBC  WBC 4.0 - 10.5 K/uL 9.2   7.5   Hemoglobin 12.0 - 15.0 g/dL 11.9  14.7     82.9   Hematocrit 36.0 - 46.0 % 39.6  40     39.9   Platelets 150 - 400 K/uL 275  286     264      This result is from an  external source.      Latest Ref Rng & Units 03/03/2022    8:59 AM 02/28/2013   12:00 AM 08/11/2011   11:41 AM  CMP  Glucose 70 - 99 mg/dL 811   96   BUN 8 - 23 mg/dL 19   14   Creatinine 9.14 - 1.00 mg/dL 7.82   9.56   Sodium 213 - 145 mmol/L 135   138   Potassium 3.5 - 5.1 mmol/L 4.2   3.6   Chloride 98 - 111 mmol/L 99   100   CO2 22 - 32 mmol/L 27   30   Calcium 8.9 - 10.3 mg/dL 9.5   9.8   Total Protein 6.5 - 8.1 g/dL 7.2   6.9   Total Bilirubin 0.3 - 1.2 mg/dL 0.7  0.4     0.4   Alkaline Phos 38 - 126 U/L 95   99   AST 15 - 41 U/L 18  27     20    ALT 0 - 44 U/L 19  42     24      This result is from an external source.     No results found for: "CEA1", "CEA" / No results found for: "CEA1", "CEA" No  results found for: "PSA1" No results found for: "YQM578" No results found for: "CAN125"  No results found for: "TOTALPROTELP", "ALBUMINELP", "A1GS", "A2GS", "BETS", "BETA2SER", "GAMS", "MSPIKE", "SPEI" No results found for: "TIBC", "FERRITIN", "IRONPCTSAT" No results found for: "LDH"   STUDIES:   CT Chest W Contrast  Result Date: 03/08/2023 CLINICAL DATA:  Follow up pulmonary nodule.  High cancer risk. EXAM: CT CHEST WITH CONTRAST TECHNIQUE: Multidetector CT imaging of the chest was performed during intravenous contrast administration. RADIATION DOSE REDUCTION: This exam was performed according to the departmental dose-optimization program which includes automated exposure control, adjustment of the mA and/or kV according to patient size and/or use of iterative reconstruction technique. CONTRAST:  80mL OMNIPAQUE IOHEXOL 300 MG/ML  SOLN COMPARISON:  PET-CT 09/01/2021. Prior CTs of the chest, most recently done 03/03/2022, 08/05/2021 and 10/27/2016. FINDINGS: Cardiovascular: No acute vascular findings are seen. There is diffuse atherosclerosis of the aorta, great vessels and coronary arteries. A small amount of pericardial fluid appears stable. The heart size is normal. Mediastinum/Nodes: There are no enlarged mediastinal, hilar or axillary lymph nodes.Similar chronic diffuse esophageal wall thickening without significant distension. The thyroid gland appears unremarkable. Lungs/Pleura: No pleural effusion or pneumothorax. Stable mild biapical scarring. The ground-glass nodule posteriorly in the right upper lobe appears unchanged, measuring 2.9 x 2.0 cm on image 41/3. This has a stable linear internal density, but no significant solid component. Solid well-circumscribed noncalcified left upper lobe nodule measuring 1.0 cm on image 59/3 is stable from 2018, consistent with a benign finding. No new or enlarging nodules are identified. There is stable scarring at the left lung base. Upper abdomen: Multiple  small calcified gallstones are again noted. There is mild gallbladder wall thickening. Persistent intra and extrahepatic biliary dilatation with the common hepatic duct measuring up to 1.7 cm in diameter. Findings are similar to the most recent CT, although have progressed from older prior studies. The adrenal glands are unchanged. Musculoskeletal/Chest wall: Bilateral mastectomy. No evidence of recurrent chest wall mass or osseous metastatic disease. Mild spondylosis. Unless specific follow-up recommendations are mentioned in the findings or impression sections, no imaging follow-up of any mentioned incidental findings is recommended. IMPRESSION: 1. Stable ground-glass nodule posteriorly in the right upper lobe without  measurable solid component. As previously discussed, this has minimally enlarged from 2018. Consider continued surveillance with CT in 12-18 months. 2. No new or enlarging pulmonary nodules. 3. Cholelithiasis with mild gallbladder wall thickening and persistent intra and extrahepatic biliary dilatation. Correlate with liver function tests and consider MRCP or ERCP if clinically warranted. 4. Coronary and aortic Atherosclerosis (ICD10-I70.0). Electronically Signed   By: Carey Bullocks M.D.   On: 03/08/2023 09:35

## 2023-03-08 NOTE — Patient Instructions (Addendum)
[facility] at [facility] Discharge Instructions   You were seen and examined today by Dr. Ellin Saba.  He reviewed the results of your CT scan. The spot on the right lung has not grown or changed in size.   We will see you back in one year. We will repeat a CT scan prior to this visit.   Return as scheduled.    Thank you for choosing  Cancer Center at [facility] to provide your oncology and hematology care.  To afford each patient quality time with our provider, please arrive at least 15 minutes before your scheduled appointment time.   If you have a lab appointment with the Cancer Center please come in thru the Main Entrance and check in at the main information desk.  You need to re-schedule your appointment should you arrive 10 or more minutes late.  We strive to give you quality time with our providers, and arriving late affects you and other patients whose appointments are after yours.  Also, if you no show three or more times for appointments you may be dismissed from the clinic at the providers discretion.     Again, thank you for choosing [facility].  Our hope is that these requests will decrease the amount of time that you wait before being seen by our physicians.       _____________________________________________________________  Should you have questions after your visit to [facility], please contact our office at [phone] and follow the prompts.  Our office hours are 8:00 a.m. and 4:30 p.m. Monday - Friday.  Please note that voicemails left after 4:00 p.m. may not be returned until the following business day.  We are closed weekends and major holidays.  You do have access to a nurse 24-7, just call the main number to the clinic [phone] and do not press any options, hold on the line and a nurse will answer the phone.    For prescription refill requests, have your pharmacy contact our office and allow  72 hours.    Due to Covid, you will need to wear a mask upon entering the hospital. If you do not have a mask, a mask will be given to you at the Main Entrance upon arrival. For doctor visits, patients may have 1 support person age 47 or older with them. For treatment visits, patients can not have anyone with them due to social distancing guidelines and our immunocompromised population.

## 2023-03-13 ENCOUNTER — Encounter: Payer: Self-pay | Admitting: Family Medicine

## 2023-03-26 DIAGNOSIS — J069 Acute upper respiratory infection, unspecified: Secondary | ICD-10-CM | POA: Diagnosis not present

## 2023-03-26 DIAGNOSIS — F17210 Nicotine dependence, cigarettes, uncomplicated: Secondary | ICD-10-CM | POA: Diagnosis not present

## 2023-04-04 ENCOUNTER — Encounter (INDEPENDENT_AMBULATORY_CARE_PROVIDER_SITE_OTHER): Payer: Self-pay | Admitting: Otolaryngology

## 2023-05-28 ENCOUNTER — Telehealth (INDEPENDENT_AMBULATORY_CARE_PROVIDER_SITE_OTHER): Payer: Self-pay | Admitting: Otolaryngology

## 2023-05-28 NOTE — Telephone Encounter (Signed)
Reminder Call:  Date: 05/29/2023 Status: Sch  Time: 10:00 AM [address] [place], [place] [address]  TEXT YES -05/28/2023 8:03 AM -Confirmed  location w/patient.

## 2023-05-29 ENCOUNTER — Encounter (INDEPENDENT_AMBULATORY_CARE_PROVIDER_SITE_OTHER): Payer: Self-pay

## 2023-05-29 ENCOUNTER — Ambulatory Visit (INDEPENDENT_AMBULATORY_CARE_PROVIDER_SITE_OTHER): Payer: Medicare HMO | Admitting: Otolaryngology

## 2023-05-29 VITALS — BP 108/65 | HR 71 | Ht 64.0 in | Wt 185.0 lb

## 2023-05-29 DIAGNOSIS — H9313 Tinnitus, bilateral: Secondary | ICD-10-CM

## 2023-05-29 DIAGNOSIS — H9190 Unspecified hearing loss, unspecified ear: Secondary | ICD-10-CM

## 2023-05-29 NOTE — Progress Notes (Signed)
 Dear Dr. Shona, Here is my assessment for our mutual patient, [name]. Thank you for allowing me the opportunity to care for your patient. Please do not hesitate to contact me should you have any other questions. Sincerely, Dr. Eldora Ray  Otolaryngology Clinic Note Referring provider: Dr. Shona HPI:  [name] is a 83 y.o. female kindly referred by Dr. Shona for evaluation of hearing loss.   Patient reports: bilateral L > R hearing loss, ongoing for several years but worsening. She had a cerumen impaction which was cleared and it helped some but not all the way. Otherwise no issues. Bilateral intermittent tinnitus. No recent audiogram Patient denies: ear pain, fullness, vertigo, drainage Patient additionally denies: deep pain in ear canal, eustachian tube symptoms such as popping, crackling, sensitive to pressure changes Patient also denies barotrauma, vestibular suppressant use Prior ear surgery: history of ear infections as a child  Denies frequent sinus infections or CRS symptoms. Not using sprays in nose.  H&N Surgery: Sinus surgery (years ago, no issues with sinuses) Personal or FHx of bleeding dz or anesthesia difficulty: no  GLP-1: no AP/AC: ASA 81  PMHx: Hypothyroidism, HTN, Pre-diabetes, Osteoporosis, AR on zyrtec, GAD, Neuropathy, Breast cancer (had adriamycin and arimidex and tomixfen)  Independent Review of Additional Tests or Records:  Dr. Shona (PCP) - 03/08/2023 notes reviewed and uploaded or available in chart in media tab -- noted wanting to have ears looked at for hearing loss; noted b/l cerumen impaction, cleared with irrigation; Tms clear; Dx: Hearing loss; Rx: Ref ENT CBC and CMP 03/03/2022 reviewed: no significant kidney or liver dysfunction on labs; WBC 9.2, Plt 275  PMH/Meds/All/SocHx/FamHx/ROS:   Past Medical History:  Diagnosis Date   Abnormal heart rhythms    Allergic rhinitis    Anxiety    Arthritis    Bronchitis    Chest wall pain     ANTERIOR   Depression    Dysuria    Eustachian tube dysfunction    Gallstones    Gallstones    GERD (gastroesophageal reflux disease)    Hyperlipidemia    Hypertension    Hypothyroidism    Hypoxemia    Incontinence    Liver function study, abnormal    Malaise and fatigue    Microscopic hematuria    Osteopenia    Personal history of colonic polyps    Primary breast malignancy (HCC)    Snoring    Tobacco abuse    Ulcer      Past Surgical History:  Procedure Laterality Date   BILATERAL MASTECTOMY     COLONOSCOPY  4/07   MFM:[id number] internal hemorrhoids otherwise normal   COLONOSCOPY  05/25/2010   MFM:[id number] hemorrhoids, otherwise normal rectum and colon.(POOR PREP). Due for surveillance 2017.    COLONOSCOPY  04/22/2002     RMR: Internal hemorrhoids, otherwise normal rectum  Polyps in the left colon resected with snare   COLONOSCOPY N/A 11/30/2017   Procedure: COLONOSCOPY;  Surgeon: Shaaron Lamar HERO, MD;  Location: [facility];  Service: Endoscopy;  Laterality: N/A;  12:00pm   ESOPHAGOGASTRODUODENOSCOPY  05/25/2010   RMR:EGD distal esophageal erosions consistent with mild erosive reflux esophagitis, otherwise unremarkable esophagus status post passage of a 56-French Maloney dilator/Small hiatal hernia/Gastric ulcer with negative H.pylori. Overdue for surveillance.    ESOPHAGOGASTRODUODENOSCOPY N/A 03/10/2013   Dr. Shaaron: Schatzki's ring s/p dilation, erosive reflux esophagitis, hiatal hernia, gastric erosions, negative h.pylori   ESOPHAGOGASTRODUODENOSCOPY N/A 11/30/2017   Procedure: ESOPHAGOGASTRODUODENOSCOPY (EGD);  Surgeon: Shaaron Lamar HERO, MD;  Location: [facility];  Service: Endoscopy;  Laterality: N/A;   LEFT LEG SURGERY     MULTIPLE DUE TO FX   LEFT RTC TEAR     MALONEY DILATION N/A 03/10/2013   Procedure: AGAPITO DILATION;  Surgeon: Lamar CHRISTELLA Hollingshead, MD;  Location: [facility];  Service: Endoscopy;  Laterality: N/A;   MALONEY DILATION N/A 11/30/2017   Procedure:  AGAPITO DILATION;  Surgeon: Hollingshead Lamar CHRISTELLA, MD;  Location: [facility];  Service: Endoscopy;  Laterality: N/A;   SAVORY DILATION N/A 03/10/2013   Procedure: SAVORY DILATION;  Surgeon: Lamar CHRISTELLA Hollingshead, MD;  Location: [facility];  Service: Endoscopy;  Laterality: N/A;   TONSILECTOMY, ADENOIDECTOMY, BILATERAL MYRINGOTOMY AND TUBES     TONSILLECTOMY     VESICOVAGINAL FISTULA CLOSURE W/ TAH     excessive bleeding-benign    Family History  Problem Relation Age of Onset   Cancer Mother    Heart disease Father    Heart disease Brother    Cancer Sister    Aneurysm Sister    Colon cancer Neg Hx      Social Connections: Not on file      Current Outpatient Medications:    acetaminophen  (TYLENOL ) 500 MG tablet, Take 500-1,000 mg by mouth daily as needed (for arthritis pain.)., Disp: , Rfl:    amLODipine  (NORVASC ) 10 MG tablet, Take 10 mg by mouth daily.  , Disp: , Rfl:    aspirin  EC 81 MG tablet, Take 81 mg by mouth daily., Disp: , Rfl:    atorvastatin  (LIPITOR) 40 MG tablet, Take 40 mg by mouth daily., Disp: , Rfl:    carvedilol  (COREG ) 3.125 MG tablet, Take 3.125 mg by mouth 2 (two) times daily with a meal., Disp: , Rfl:    Cholecalciferol (VITAMIN D3) 1.25 MG (50000 UT) CAPS, Take 1,000 mcg by mouth., Disp: , Rfl:    clonazePAM  (KLONOPIN ) 0.5 MG tablet, Take 1 tablet by mouth daily as needed., Disp: , Rfl:    diazepam  (VALIUM ) 5 MG tablet, TAKE 1 TABLET BY MOUTH ONCE FOR 1 DOSE. TAKE 1 HOUR PRIOR TO SCAN., Disp: , Rfl:    fexofenadine (ALLEGRA) 180 MG tablet, Take 180 mg by mouth daily., Disp: , Rfl:    hydrochlorothiazide  (HYDRODIURIL ) 25 MG tablet, Take 25 mg by mouth daily., Disp: , Rfl:    levothyroxine  (SYNTHROID , LEVOTHROID) 137 MCG tablet, Take 137 mcg by mouth daily before breakfast. , Disp: , Rfl: 5   lisinopril  (ZESTRIL ) 40 MG tablet, Take 40 mg by mouth daily., Disp: , Rfl:    pantoprazole  (PROTONIX ) 40 MG tablet, Take 1 tablet by mouth daily., Disp: , Rfl:    vitamin B-12  (CYANOCOBALAMIN ) 1000 MCG tablet, Take 1,000 mcg by mouth daily., Disp: , Rfl:    Physical Exam:   BP 108/65 (BP Location: Left Arm, Patient Position: Sitting, Cuff Size: Large)   Pulse 71   Ht 5' 4 (1.626 m)   Wt 185 lb (83.9 kg)   SpO2 93%   BMI 31.76 kg/m   Salient findings:  CN II-XII intact Given history and complaints, ear microscopy was indicated and performed for evaluation with findings as below in physical exam section and in procedures Bilateral EAC clear; TM with diffuse myringosclerosis, monomeric area b/l - likely where ear tubes were; some pars flaccida retraction, no perforation Weber 512: mid Rinne 512: AC > BC b/l  Anterior rhinoscopy: Septum intact; no purulence No lesions of oral cavity/oropharynx No obviously palpable neck masses/lymphadenopathy/thyromegaly  No respiratory distress or stridor  Seprately Identifiable Procedures:  Procedure: Bilateral ear microscopy using microscope (CPT [phone]) Pre-procedure diagnosis: hearing loss, concern for TM perforation from PCP Post-procedure diagnosis: same Indication: see above; given patient's otologic complaints and history, for improved and comprehensive examination of external ear and tympanic membrane, bilateral otologic examination using microscope was performed  Procedure: Patient was placed semi-recumbent. Both ear canals were examined using the microscope with findings above. Some cerumen removed on left and on right using suction and currette.  Patient tolerated the procedure well.   Impression & Plans:  [name] is a 83 y.o. female with:  1. Subjective hearing loss   2. Bilateral tinnitus    Bilateral subjective HL which is worsening; clear evidence of prior infections with myringosclerosis but no perforation noted Will schedule her for an audiogram  Discussed cerumen management strategies F/u based on audio; if normal, PRN; otherwise can consider recheck in 1 year with likely aiding if SNHL  See  below regarding exact medications prescribed this encounter including dosages and route: No orders of the defined types were placed in this encounter.     Thank you for allowing me the opportunity to care for your patient. Please do not hesitate to contact me should you have any other questions.  Sincerely, Susan Blanch, MD Otolaryngologist (ENT), [facility] Phone: [phone] Fax: [phone]  05/29/2023, 10:35 AM   MDM:  Level 4 Complexity/Problems addressed: mod - chronic problem, worsenin Data complexity: mod - independent review of notes, labs; ordering tests - Morbidity: low  - Prescription Drug prescribed or managed: no

## 2023-06-08 DIAGNOSIS — L720 Epidermal cyst: Secondary | ICD-10-CM | POA: Diagnosis not present

## 2023-06-08 DIAGNOSIS — L821 Other seborrheic keratosis: Secondary | ICD-10-CM | POA: Diagnosis not present

## 2023-06-08 DIAGNOSIS — L570 Actinic keratosis: Secondary | ICD-10-CM | POA: Diagnosis not present

## 2023-06-12 ENCOUNTER — Ambulatory Visit (INDEPENDENT_AMBULATORY_CARE_PROVIDER_SITE_OTHER): Payer: Medicare HMO | Admitting: Audiology

## 2023-06-12 DIAGNOSIS — H906 Mixed conductive and sensorineural hearing loss, bilateral: Secondary | ICD-10-CM | POA: Diagnosis not present

## 2023-06-12 NOTE — Progress Notes (Signed)
  [address] [place], [place] [address] [phone]  Audiological Evaluation    Name: [name]     DOB:   1941-02-26      MRN:   [id number]                                                                                     Service Date: 06/12/2023     Accompanied by: daughter-in-law   Patient comes today after Dr. Allena Katz, ENT sent a referral for a hearing evaluation due to concerns with hearing loss.   Symptoms Yes Details  Hearing loss  [x]  Perceived by patient sometimes if people speak too fast  Tinnitus  []    Ear pain/ infections/pressure  [x]  Today feels left side a little congested and like is talking through her nose  Balance problems  [x]  Off balance /swimmy headed/spinning - comes and goes and she ha noticed it more consistently when she lays flat and down at the dentist  Noise exposure history  []    Previous ear surgeries  []    Family history of hearing loss  [x]  Siblings with age  Amplification  []    Other  [x]  Reports ear itchiness     Otoscopy: Right ear: Clear external ear canals and notable landmarks visualized on the tympanic membrane. Left ear:  Clear external ear canals and notable landmarks visualized on the tympanic membrane.  Tympanometry: Right ear: Type A- Normal external ear canal volume with normal middle ear pressure and tympanic membrane compliance Left ear: Normal external ear canal volume with negative middle ear pressure and reduced tympanic membrane compliance  Pure tone Audiometry:   Normal to severe mixed hearing loss in both ears, (more air-bone gaps in the left ear) from [phone] Hz.    Speech Audiometry: Right ear- Speech Reception Threshold (SRT) was obtained at 45 dBHL. Left ear-Speech Reception Threshold (SRT) was obtained at 45 dBHL.   Word Recognition Score Tested using NU-6 (MLV) Right ear: 88% was obtained at a presentation level of 85 dBHL with contralateral masking which is deemed as  good . Left ear: 84%  was obtained at a presentation level of 85 dBHL with contralateral masking which is deemed as  good .   The hearing test results were completed under headphones  and re-checked with inserts and results are deemed to be of good reliability. Test technique:  conventional     Recommendations: Follow up with ENT as scheduled as needed.  Return for a hearing evaluation if concerns with hearing changes arise or per MD recommendation. Consider a communication needs assessment after medical clearance for hearing aids is obtained and with at least a hearing spot check.   Zain Lankford MARIE LEROUX-MARTINEZ, AUD

## 2023-06-14 ENCOUNTER — Ambulatory Visit (INDEPENDENT_AMBULATORY_CARE_PROVIDER_SITE_OTHER): Payer: Medicare HMO | Admitting: Audiology

## 2023-06-24 ENCOUNTER — Inpatient Hospital Stay (HOSPITAL_COMMUNITY)
Admission: EM | Admit: 2023-06-24 | Discharge: 2023-06-27 | DRG: 418 | Disposition: A | Discharge status: Home / Self Care | Attending: Internal Medicine | Admitting: Internal Medicine

## 2023-06-24 ENCOUNTER — Inpatient Hospital Stay (HOSPITAL_COMMUNITY)

## 2023-06-24 ENCOUNTER — Emergency Department (HOSPITAL_COMMUNITY)

## 2023-06-24 ENCOUNTER — Other Ambulatory Visit: Payer: Self-pay

## 2023-06-24 ENCOUNTER — Encounter (HOSPITAL_COMMUNITY): Payer: Self-pay

## 2023-06-24 DIAGNOSIS — K8689 Other specified diseases of pancreas: Secondary | ICD-10-CM | POA: Diagnosis not present

## 2023-06-24 DIAGNOSIS — E785 Hyperlipidemia, unspecified: Secondary | ICD-10-CM | POA: Diagnosis not present

## 2023-06-24 DIAGNOSIS — Z9013 Acquired absence of bilateral breasts and nipples: Secondary | ICD-10-CM

## 2023-06-24 DIAGNOSIS — K838 Other specified diseases of biliary tract: Secondary | ICD-10-CM | POA: Diagnosis not present

## 2023-06-24 DIAGNOSIS — F17210 Nicotine dependence, cigarettes, uncomplicated: Secondary | ICD-10-CM | POA: Diagnosis present

## 2023-06-24 DIAGNOSIS — Z7982 Long term (current) use of aspirin: Secondary | ICD-10-CM

## 2023-06-24 DIAGNOSIS — Z8249 Family history of ischemic heart disease and other diseases of the circulatory system: Secondary | ICD-10-CM

## 2023-06-24 DIAGNOSIS — I10 Essential (primary) hypertension: Secondary | ICD-10-CM | POA: Diagnosis not present

## 2023-06-24 DIAGNOSIS — F341 Dysthymic disorder: Secondary | ICD-10-CM | POA: Diagnosis not present

## 2023-06-24 DIAGNOSIS — Z882 Allergy status to sulfonamides status: Secondary | ICD-10-CM | POA: Diagnosis not present

## 2023-06-24 DIAGNOSIS — Z79890 Hormone replacement therapy (postmenopausal): Secondary | ICD-10-CM | POA: Diagnosis not present

## 2023-06-24 DIAGNOSIS — K8064 Calculus of gallbladder and bile duct with chronic cholecystitis without obstruction: Secondary | ICD-10-CM | POA: Diagnosis not present

## 2023-06-24 DIAGNOSIS — F32A Depression, unspecified: Secondary | ICD-10-CM | POA: Diagnosis not present

## 2023-06-24 DIAGNOSIS — Z9049 Acquired absence of other specified parts of digestive tract: Secondary | ICD-10-CM | POA: Diagnosis not present

## 2023-06-24 DIAGNOSIS — K5900 Constipation, unspecified: Secondary | ICD-10-CM | POA: Diagnosis not present

## 2023-06-24 DIAGNOSIS — D3501 Benign neoplasm of right adrenal gland: Secondary | ICD-10-CM | POA: Diagnosis not present

## 2023-06-24 DIAGNOSIS — R109 Unspecified abdominal pain: Secondary | ICD-10-CM | POA: Diagnosis not present

## 2023-06-24 DIAGNOSIS — R7989 Other specified abnormal findings of blood chemistry: Secondary | ICD-10-CM | POA: Diagnosis not present

## 2023-06-24 DIAGNOSIS — Z853 Personal history of malignant neoplasm of breast: Secondary | ICD-10-CM

## 2023-06-24 DIAGNOSIS — E876 Hypokalemia: Secondary | ICD-10-CM | POA: Diagnosis not present

## 2023-06-24 DIAGNOSIS — K819 Cholecystitis, unspecified: Secondary | ICD-10-CM | POA: Diagnosis not present

## 2023-06-24 DIAGNOSIS — Z79899 Other long term (current) drug therapy: Secondary | ICD-10-CM

## 2023-06-24 DIAGNOSIS — K8020 Calculus of gallbladder without cholecystitis without obstruction: Secondary | ICD-10-CM

## 2023-06-24 DIAGNOSIS — E039 Hypothyroidism, unspecified: Secondary | ICD-10-CM | POA: Diagnosis present

## 2023-06-24 DIAGNOSIS — K8050 Calculus of bile duct without cholangitis or cholecystitis without obstruction: Secondary | ICD-10-CM | POA: Diagnosis not present

## 2023-06-24 DIAGNOSIS — K219 Gastro-esophageal reflux disease without esophagitis: Secondary | ICD-10-CM | POA: Diagnosis present

## 2023-06-24 DIAGNOSIS — R911 Solitary pulmonary nodule: Secondary | ICD-10-CM | POA: Diagnosis present

## 2023-06-24 DIAGNOSIS — K429 Umbilical hernia without obstruction or gangrene: Secondary | ICD-10-CM | POA: Diagnosis not present

## 2023-06-24 DIAGNOSIS — R1011 Right upper quadrant pain: Secondary | ICD-10-CM | POA: Diagnosis not present

## 2023-06-24 DIAGNOSIS — K811 Chronic cholecystitis: Secondary | ICD-10-CM | POA: Diagnosis not present

## 2023-06-24 DIAGNOSIS — E878 Other disorders of electrolyte and fluid balance, not elsewhere classified: Secondary | ICD-10-CM | POA: Diagnosis not present

## 2023-06-24 DIAGNOSIS — G8929 Other chronic pain: Secondary | ICD-10-CM | POA: Diagnosis present

## 2023-06-24 DIAGNOSIS — F17200 Nicotine dependence, unspecified, uncomplicated: Secondary | ICD-10-CM | POA: Diagnosis present

## 2023-06-24 DIAGNOSIS — K8051 Calculus of bile duct without cholangitis or cholecystitis with obstruction: Secondary | ICD-10-CM | POA: Diagnosis not present

## 2023-06-24 DIAGNOSIS — K8012 Calculus of gallbladder with acute and chronic cholecystitis without obstruction: Secondary | ICD-10-CM | POA: Diagnosis not present

## 2023-06-24 DIAGNOSIS — E871 Hypo-osmolality and hyponatremia: Secondary | ICD-10-CM | POA: Diagnosis not present

## 2023-06-24 DIAGNOSIS — K8045 Calculus of bile duct with chronic cholecystitis with obstruction: Secondary | ICD-10-CM | POA: Diagnosis not present

## 2023-06-24 DIAGNOSIS — R932 Abnormal findings on diagnostic imaging of liver and biliary tract: Secondary | ICD-10-CM | POA: Diagnosis not present

## 2023-06-24 DIAGNOSIS — Z860100 Personal history of colon polyps, unspecified: Secondary | ICD-10-CM

## 2023-06-24 LAB — URINALYSIS, W/ REFLEX TO CULTURE (INFECTION SUSPECTED)
Bacteria, UA: NONE SEEN
Bilirubin Urine: NEGATIVE
Glucose, UA: NEGATIVE mg/dL
Hgb urine dipstick: NEGATIVE
Ketones, ur: NEGATIVE mg/dL
Leukocytes,Ua: NEGATIVE
Nitrite: NEGATIVE
Protein, ur: NEGATIVE mg/dL
Specific Gravity, Urine: 1.027 (ref 1.005–1.030)
pH: 5 (ref 5.0–8.0)

## 2023-06-24 LAB — COMPREHENSIVE METABOLIC PANEL
ALT: 590 U/L — ABNORMAL HIGH (ref 0–44)
AST: 335 U/L — ABNORMAL HIGH (ref 15–41)
Albumin: 3.4 g/dL — ABNORMAL LOW (ref 3.5–5.0)
Alkaline Phosphatase: 230 U/L — ABNORMAL HIGH (ref 38–126)
Anion gap: 10 (ref 5–15)
BUN: 18 mg/dL (ref 8–23)
CO2: 27 mmol/L (ref 22–32)
Calcium: 9.4 mg/dL (ref 8.9–10.3)
Chloride: 94 mmol/L — ABNORMAL LOW (ref 98–111)
Creatinine, Ser: 0.91 mg/dL (ref 0.44–1.00)
GFR, Estimated: >60 mL/min (ref >60)
Glucose, Bld: 108 mg/dL — ABNORMAL HIGH (ref 70–99)
Potassium: 3.4 mmol/L — ABNORMAL LOW (ref 3.5–5.1)
Sodium: 131 mmol/L — ABNORMAL LOW (ref 135–145)
Total Bilirubin: 2.8 mg/dL — ABNORMAL HIGH (ref 0.0–1.2)
Total Protein: 6.8 g/dL (ref 6.5–8.1)

## 2023-06-24 LAB — CBC WITH DIFFERENTIAL/PLATELET
Abs Immature Granulocytes: 0.04 10*3/uL (ref 0.00–0.07)
Basophils Absolute: 0.1 10*3/uL (ref 0.0–0.1)
Basophils Relative: 1 %
Eosinophils Absolute: 0.2 10*3/uL (ref 0.0–0.5)
Eosinophils Relative: 2 %
HCT: 37.9 % (ref 36.0–46.0)
Hemoglobin: 12.9 g/dL (ref 12.0–15.0)
Immature Granulocytes: 0 %
Lymphocytes Relative: 15 %
Lymphs Abs: 1.5 10*3/uL (ref 0.7–4.0)
MCH: 30.4 pg (ref 26.0–34.0)
MCHC: 34 g/dL (ref 30.0–36.0)
MCV: 89.4 fL (ref 80.0–100.0)
Monocytes Absolute: 1.3 10*3/uL — ABNORMAL HIGH (ref 0.1–1.0)
Monocytes Relative: 13 %
Neutro Abs: 7 10*3/uL (ref 1.7–7.7)
Neutrophils Relative %: 69 %
Platelets: 247 10*3/uL (ref 150–400)
RBC: 4.24 MIL/uL (ref 3.87–5.11)
RDW: 13.7 % (ref 11.5–15.5)
WBC: 10.1 10*3/uL (ref 4.0–10.5)
nRBC: 0 % (ref 0.0–0.2)

## 2023-06-24 LAB — TROPONIN I (HIGH SENSITIVITY)
Troponin I (High Sensitivity): 7 ng/L (ref <18)
Troponin I (High Sensitivity): 7 ng/L (ref <18)

## 2023-06-24 LAB — LIPASE, BLOOD: Lipase: 47 U/L (ref 11–51)

## 2023-06-24 MED ORDER — ONDANSETRON HCL 4 MG/2ML IJ SOLN
4.0000 mg | Freq: Four times a day (QID) | INTRAMUSCULAR | Status: DC | PRN
  Filled 2023-06-24: qty 2

## 2023-06-24 MED ORDER — IOHEXOL 350 MG/ML SOLN
75.0000 mL | Freq: Once | INTRAVENOUS | Status: AC | PRN
  Administered 2023-06-24: 75 mL via INTRAVENOUS

## 2023-06-24 MED ORDER — POTASSIUM CHLORIDE 10 MEQ/100ML IV SOLN
10.0000 meq | INTRAVENOUS | Status: AC
  Administered 2023-06-24: 10 meq via INTRAVENOUS
  Filled 2023-06-24: qty 100

## 2023-06-24 MED ORDER — CLONAZEPAM 0.5 MG PO TABS
0.5000 mg | ORAL_TABLET | Freq: Every day | ORAL | Status: DC
  Administered 2023-06-24 – 2023-06-26 (×3): 0.5 mg via ORAL
  Filled 2023-06-24 (×3): qty 1

## 2023-06-24 MED ORDER — ENOXAPARIN SODIUM 40 MG/0.4ML IJ SOSY
40.0000 mg | PREFILLED_SYRINGE | INTRAMUSCULAR | Status: DC
Start: 2023-06-24 — End: 2023-06-24

## 2023-06-24 MED ORDER — SODIUM CHLORIDE 0.9 % IV SOLN: INTRAVENOUS | Status: AC

## 2023-06-24 MED ORDER — CLONAZEPAM 0.5 MG PO TABS: 0.5000 mg | ORAL_TABLET | ORAL | Status: DC

## 2023-06-24 MED ORDER — LORAZEPAM 2 MG/ML IJ SOLN: 0.5000 mg | Freq: Once | INTRAMUSCULAR | Status: DC | PRN

## 2023-06-24 MED ORDER — FENTANYL CITRATE PF 50 MCG/ML IJ SOSY: 25.0000 ug | PREFILLED_SYRINGE | INTRAMUSCULAR | Status: DC | PRN

## 2023-06-24 MED ORDER — LORATADINE 10 MG PO TABS
10.0000 mg | ORAL_TABLET | Freq: Every day | ORAL | Status: DC
  Administered 2023-06-24 – 2023-06-27 (×3): 10 mg via ORAL
  Filled 2023-06-24 (×3): qty 1

## 2023-06-24 MED ORDER — CLONAZEPAM 0.5 MG PO TABS: 0.5000 mg | ORAL_TABLET | Freq: Every day | ORAL | Status: DC | PRN

## 2023-06-24 MED ORDER — SODIUM CHLORIDE 0.9% FLUSH
3.0000 mL | Freq: Two times a day (BID) | INTRAVENOUS | Status: DC
  Administered 2023-06-24 – 2023-06-27 (×4): 3 mL via INTRAVENOUS

## 2023-06-24 MED ORDER — PANTOPRAZOLE SODIUM 40 MG PO TBEC
40.0000 mg | DELAYED_RELEASE_TABLET | Freq: Every day | ORAL | Status: DC
  Administered 2023-06-26 – 2023-06-27 (×2): 40 mg via ORAL
  Filled 2023-06-24 (×2): qty 1

## 2023-06-24 MED ORDER — GADOBUTROL 1 MMOL/ML IV SOLN
8.0000 mL | Freq: Once | INTRAVENOUS | Status: AC | PRN
  Administered 2023-06-24: 8 mL via INTRAVENOUS

## 2023-06-24 MED ORDER — LORAZEPAM 2 MG/ML IJ SOLN
0.5000 mg | Freq: Once | INTRAMUSCULAR | Status: AC
  Administered 2023-06-24: 0.5 mg via INTRAVENOUS
  Filled 2023-06-24: qty 1

## 2023-06-24 MED ORDER — LEVOTHYROXINE SODIUM 25 MCG PO TABS
137.0000 ug | ORAL_TABLET | Freq: Every day | ORAL | Status: DC
  Administered 2023-06-25 – 2023-06-27 (×3): 137 ug via ORAL
  Filled 2023-06-24 (×3): qty 1

## 2023-06-24 NOTE — H&P (View-Only) (Signed)
 Consultation  Referring Provider: ERMD/ Criss Alvine Primary Care Physician:  Benita Stabile, MD Primary Gastroenterologist:  Dr. Jena Gauss  Reason for Consultation: Severe abdominal pain, nausea vomiting, elevated LFTs, cholelithiasis  HPI: [name] is a 83 y.o. female with history of hypertension, hyperlipidemia, hypothyroidism, breast cancer, osteoarthritis, GERD with prior esophageal dilation, who presents to the emergency room today with persistent abdominal pain nausea and vomiting. She has known cholelithiasis and apparently has known for many years that she had gallstones but said she never had any symptoms until about 6 months ago.  Since then she had had an occasional episode of abdominal pain that she thought may have been gallbladder versus gas. Over the past couple of weeks she has had increased frequency of symptoms, and then Friday night 06/22/2023 she had a severe episode at home with pain in the right upper quadrant radiating to her shoulder blades and into her chest.  She says this made her feel somewhat short of breath, was constant and severe for at least 3 hours and associated with nausea and vomiting.  She called EMS and was checked out but did not decide to go to the emergency room. Says since then the pain has not been as bad but has not gone away she still nauseated and says she is uncomfortable laying flat and unable to eat. No documented fever or chills.  CT scan in the ER today shows multiple gallstones no gallbladder wall thickening, CBD of 10 mm and no definite choledocholithiasis.  She does have moderate stool in the colon.  Labs show WBC of 10.1/hemoglobin 12.9/hematocrit 37.9 Sodium 131/potassium 3.4 Creatinine 0.91 T. bili 2.8/alk phos 230/AST 335/ALT 590 Lipase 47 Troponins negative and UA negative.  Interestingly reviewing prior CTs, in 2023 she had a CT of her chest which showed gallstones and diffuse intra and extrahepatic ductal dilation.  MRCP was  suggested at that time. Repeat chest CT in November 2024 showed multiple gallstones, mild gallbladder wall thickening, CBD of 1.7 cm with intra and extrahepatic ductal dilation. Liver test for comparison in November 2023 were normal.  No blood thinners, no oxygen use   Past Medical History:  Diagnosis Date   Abnormal heart rhythms    Allergic rhinitis    Anxiety    Arthritis    Bronchitis    Chest wall pain    ANTERIOR   Depression    Dysuria    Eustachian tube dysfunction    Gallstones    Gallstones    GERD (gastroesophageal reflux disease)    Hyperlipidemia    Hypertension    Hypothyroidism    Hypoxemia    Incontinence    Liver function study, abnormal    Malaise and fatigue    Microscopic hematuria    Osteopenia    Personal history of colonic polyps    Primary breast malignancy (HCC)    Snoring    Tobacco abuse    Ulcer     Past Surgical History:  Procedure Laterality Date   BILATERAL MASTECTOMY     COLONOSCOPY  4/07   BJY:NWGNFAO internal hemorrhoids otherwise normal   COLONOSCOPY  05/25/2010   ZHY:QMVHQION hemorrhoids, otherwise normal rectum and colon.(POOR PREP). Due for surveillance 2017.    COLONOSCOPY  04/22/2002     RMR: Internal hemorrhoids, otherwise normal rectum  Polyps in the left colon resected with snare   COLONOSCOPY N/A 11/30/2017   Procedure: COLONOSCOPY;  Surgeon: Corbin Ade, MD;  Location: [facility];  Service:  Endoscopy;  Laterality: N/A;  12:00pm   ESOPHAGOGASTRODUODENOSCOPY  05/25/2010   RMR:EGD distal esophageal erosions consistent with mild erosive reflux esophagitis, otherwise unremarkable esophagus status post passage of a 56-French Maloney dilator/Small hiatal hernia/Gastric ulcer with negative H.pylori. Overdue for surveillance.    ESOPHAGOGASTRODUODENOSCOPY N/A 03/10/2013   Dr. Jena Gauss: Schatzki's ring s/p dilation, erosive reflux esophagitis, hiatal hernia, gastric erosions, negative h.pylori   ESOPHAGOGASTRODUODENOSCOPY N/A  11/30/2017   Procedure: ESOPHAGOGASTRODUODENOSCOPY (EGD);  Surgeon: Corbin Ade, MD;  Location: [facility];  Service: Endoscopy;  Laterality: N/A;   LEFT LEG SURGERY     MULTIPLE DUE TO FX   LEFT RTC TEAR     MALONEY DILATION N/A 03/10/2013   Procedure: [name] DILATION;  Surgeon: Corbin Ade, MD;  Location: [facility];  Service: Endoscopy;  Laterality: N/A;   MALONEY DILATION N/A 11/30/2017   Procedure: [name] DILATION;  Surgeon: Corbin Ade, MD;  Location: [facility];  Service: Endoscopy;  Laterality: N/A;   SAVORY DILATION N/A 03/10/2013   Procedure: SAVORY DILATION;  Surgeon: Corbin Ade, MD;  Location: [facility];  Service: Endoscopy;  Laterality: N/A;   TONSILECTOMY, ADENOIDECTOMY, BILATERAL MYRINGOTOMY AND TUBES     TONSILLECTOMY     VESICOVAGINAL FISTULA CLOSURE W/ TAH     excessive bleeding-benign    Prior to Admission medications   Medication Sig Start Date End Date Taking? Authorizing Provider  acetaminophen (TYLENOL) 500 MG tablet Take 1,000 mg by mouth daily as needed (for pain).   Yes [provider]  aspirin EC 81 MG tablet Take 81 mg by mouth daily.   Yes [provider]  amLODipine (NORVASC) 10 MG tablet Take 10 mg by mouth daily.      [provider]  atorvastatin (LIPITOR) 40 MG tablet Take 40 mg by mouth daily.    [provider]  carvedilol (COREG) 3.125 MG tablet Take 3.125 mg by mouth in the morning and at bedtime.    [provider]  Cholecalciferol (VITAMIN D3) 1.25 MG (50000 UT) CAPS Take 1,000 mcg by mouth.    [provider]  clonazePAM (KLONOPIN) 0.5 MG tablet Take 0.25 mg by mouth See admin instructions. Take 0.25 mg by mouth at bedtime and an additional 0.25 mg once a day as needed for anxiety    [provider]  diazepam (VALIUM) 5 MG tablet TAKE 1 TABLET BY MOUTH ONCE FOR 1 DOSE. TAKE 1 HOUR PRIOR TO SCAN.    [provider]  fexofenadine (ALLEGRA) 180 MG tablet  Take 180 mg by mouth daily.    [provider]  hydrochlorothiazide (HYDRODIURIL) 25 MG tablet Take 25 mg by mouth daily.    [provider]  levothyroxine (SYNTHROID, LEVOTHROID) 137 MCG tablet Take 137 mcg by mouth daily before breakfast.  09/19/17   [provider]  lisinopril (ZESTRIL) 40 MG tablet Take 40 mg by mouth daily.    [provider]  pantoprazole (PROTONIX) 40 MG tablet Take 1 tablet by mouth daily.    [provider]  vitamin B-12 (CYANOCOBALAMIN) 1000 MCG tablet Take 1,000 mcg by mouth daily.    [provider]    Current Facility-Administered Medications  Medication Dose Route Frequency Provider Last Rate Last Admin   0.9 %  sodium chloride infusion   Intravenous Continuous Smith, Rondell A, MD       enoxaparin (LOVENOX) injection 40 mg  40 mg Subcutaneous Q24H Esterwood, Amy S, PA-C  fentaNYL (SUBLIMAZE) injection 25 mcg  25 mcg Intravenous Q2H PRN Smith, Rondell A, MD       LORazepam (ATIVAN) injection 0.5 mg  0.5 mg Intravenous Once Esterwood, Amy S, PA-C       ondansetron (ZOFRAN) injection 4 mg  4 mg Intravenous Q6H PRN Esterwood, Amy S, PA-C       potassium chloride 10 mEq in 100 mL IVPB  10 mEq Intravenous Q1 Hr x 2 Smith, Rondell A, MD       sodium chloride flush (NS) 0.9 % injection 3 mL  3 mL Intravenous Q12H Smith, Rondell A, MD       Current Outpatient Medications  Medication Sig Dispense Refill   acetaminophen (TYLENOL) 500 MG tablet Take 1,000 mg by mouth daily as needed (for pain).     aspirin EC 81 MG tablet Take 81 mg by mouth daily.     amLODipine (NORVASC) 10 MG tablet Take 10 mg by mouth daily.       atorvastatin (LIPITOR) 40 MG tablet Take 40 mg by mouth daily.     carvedilol (COREG) 3.125 MG tablet Take 3.125 mg by mouth in the morning and at bedtime.     Cholecalciferol (VITAMIN D3) 1.25 MG (50000 UT) CAPS Take 1,000 mcg by mouth.     clonazePAM (KLONOPIN) 0.5 MG tablet Take 0.25 mg by mouth  See admin instructions. Take 0.25 mg by mouth at bedtime and an additional 0.25 mg once a day as needed for anxiety     diazepam (VALIUM) 5 MG tablet TAKE 1 TABLET BY MOUTH ONCE FOR 1 DOSE. TAKE 1 HOUR PRIOR TO SCAN.     fexofenadine (ALLEGRA) 180 MG tablet Take 180 mg by mouth daily.     hydrochlorothiazide (HYDRODIURIL) 25 MG tablet Take 25 mg by mouth daily.     levothyroxine (SYNTHROID, LEVOTHROID) 137 MCG tablet Take 137 mcg by mouth daily before breakfast.   5   lisinopril (ZESTRIL) 40 MG tablet Take 40 mg by mouth daily.     pantoprazole (PROTONIX) 40 MG tablet Take 1 tablet by mouth daily.     vitamin B-12 (CYANOCOBALAMIN) 1000 MCG tablet Take 1,000 mcg by mouth daily.      Allergies as of 06/24/2023 - Review Complete 06/24/2023  Allergen Reaction Noted   Sulfa antibiotics Anaphylaxis 06/24/2023    Family History  Problem Relation Age of Onset   Cancer Mother    Heart disease Father    Heart disease Brother    Cancer Sister    Aneurysm Sister    Colon cancer Neg Hx     Social History   Socioeconomic History   Marital status: Single    Spouse name: Not on file   Number of children: Not on file   Years of education: Not on file   Highest education level: Not on file  Occupational History   Occupation: retired  Tobacco Use   Smoking status: Every Day    Current packs/day: 0.25    Average packs/day: 0.3 packs/day for 25.0 years (6.3 ttl pk-yrs)    Types: Cigarettes   Smokeless tobacco: Never   Tobacco comments:    Smokes a pack of cigarettes about every 2-3 days  Vaping Use   Vaping status: Never Used  Substance and Sexual Activity   Alcohol use: No   Drug use: No   Sexual activity: Not Currently  Other Topics Concern   Not on file  Social History Narrative   Not on file  Social Drivers of Corporate investment banker Strain: Not on file  Food Insecurity: No Food Insecurity (06/24/2023)   Hunger Vital Sign    Worried About Running Out of Food in the Last  Year: Never true    Ran Out of Food in the Last Year: Never true  Transportation Needs: No Transportation Needs (06/24/2023)   PRAPARE - Administrator, Civil Service (Medical): No    Lack of Transportation (Non-Medical): No  Physical Activity: Not on file  Stress: Not on file  Social Connections: Moderately Integrated (06/24/2023)   Social Connection and Isolation Panel [NHANES]    Frequency of Communication with Friends and Family: More than three times a week    Frequency of Social Gatherings with Friends and Family: More than three times a week    Attends Religious Services: More than 4 times per year    Active Member of [employer] or Organizations: Yes    Attends Banker Meetings: More than 4 times per year    Marital Status: Widowed  Intimate Partner Violence: Not At Risk (06/24/2023)   Humiliation, Afraid, Rape, and Kick questionnaire    Fear of Current or Ex-Partner: No    Emotionally Abused: No    Physically Abused: No    Sexually Abused: No    Review of Systems: Pertinent positive and negative review of systems were noted in the above HPI section.  All other review of systems was otherwise negative.   Physical Exam: Vital signs in last 24 hours: Temp:  [98.3 F (36.8 C)-98.4 F (36.9 C)] 98.4 F (36.9 C) (03/02 1200) Pulse Rate:  [66-78] 75 (03/02 1445) Resp:  [16-23] 19 (03/02 1445) BP: (121-136)/(50-59) 133/59 (03/02 1430) SpO2:  [96 %-99 %] 96 % (03/02 1445) Weight:  [81.6 kg] 81.6 kg (03/02 0955)   General:   Alert,  Well-developed, well-nourished elderly white female, pleasant and cooperative in NAD, talkative, family at bedside Head:  Normocephalic and atraumatic. Eyes:  Sclera icterus.   Conjunctiva pink. Ears:  Normal auditory acuity. Nose:  No deformity, discharge,  or lesions. Mouth:  No deformity or lesions.   Neck:  Supple; no masses or thyromegaly. Lungs:   Clear throughout to auscultation.   No wheezes, crackles, or rhonchi. Heart:   Regular rate and rhythm; no murmurs, clicks, rubs,  or gallops. Abdomen:  Soft, obese, she is tender in the right upper quadrant and epigastrium no guarding or rebound, BS active,nonpalp mass or hsm.  Lower right of midline  incisional scar, umbilical hernia present Rectal: Not done Msk:  Symmetrical without gross deformities. . Pulses:  Normal pulses noted. Extremities:  Without clubbing or edema. Neurologic:  Alert and  oriented x4;  grossly normal neurologically. Skin:  Intact without significant lesions or rashes.. Psych:  Alert and cooperative. Normal mood and affect.  Intake/Output from previous day: No intake/output data recorded. Intake/Output this shift: No intake/output data recorded.  Lab Results: Recent Labs    06/24/23 1003  WBC 10.1  HGB 12.9  HCT 37.9  PLT 247   BMET Recent Labs    06/24/23 1003  NA 131*  K 3.4*  CL 94*  CO2 27  GLUCOSE 108*  BUN 18  CREATININE 0.91  CALCIUM 9.4   LFT Recent Labs    06/24/23 1003  PROT 6.8  ALBUMIN 3.4*  AST 335*  ALT 590*  ALKPHOS 230*  BILITOT 2.8*   PT/INR No results for input(s): "LABPROT", "INR" in the last 72 hours.  Hepatitis Panel No results for input(s): "HEPBSAG", "HCVAB", "HEPAIGM", "HEPBIGM" in the last 72 hours.    IMPRESSION:  #40 83 year old white female with known cholelithiasis, who has been having intermittent mild abdominal pain over the past 6 months, abrupt increase in symptoms over the past week and then a severe episode on Friday night 06/22/2023 with right upper quadrant pain radiating between the shoulder blades and into her chest, severe and lasted for about 3 hours associated with nausea and vomiting. She has not felt well since, still having discomfort but not severe pain, continues to feel nauseated and has vomited a few times.  No fever or chills.  CT today with multiple gallstones, no gallbladder wall thickening and CBD of 10 mm without definite choledocholithiasis. Normal  CBC Elevated LFTs with T. bili of 2.8/alk phos 230/AST 335 and ALT 590  Very consistent with choledocholithiasis no evidence for cholangitis at this time  # 2 history of breast cancer-monitoring enlarging pulmonary  Nodules  #3 hypertension #4.  Hyperlipidemia #5.  Chronic GERD status post prior esophageal dilation #6.  History of colon polyps #7.  Status post extensive surgeries to her left lower extremity skin grafting  Plan; stat MRI/MRCP-will need Ativan preprocedure Clear liquid diet this evening then n.p.o. after midnight Check repeat labs in a.m. to include INR Hold Lovenox as likely will have ERCP tomorrow Will tentatively schedule for ERCP with stone extraction for Dr. Meridee Score  tomorrow afternoon.  I discussed ERCP in detail with the patient including indications risks and benefits, we reviewed biliary images.  She is agreeable to proceed. Also discussed that she should have cholecystectomy this admission, please ask surgery to see her tomorrow  GI will follow with you     Amy Esterwood PA-C 06/24/2023, 3:44 PM   Attending physician's note  I have taken a history, reviewed the chart and examined the patient. I performed a substantive portion of this encounter, including complete performance of at least one of the key components, in conjunction with the APP. I agree with the APP's note, impression and recommendations.    84 year old female with known cholelithiasis with worsening intermittent abdominal pain, dilated CBD Will need to exclude choledocholithiasis, will obtain MRCP, tentatively plan for ERCP tomorrow based on MRCP findings N.p.o. after midnight  The patient was provided an opportunity to ask questions and all were answered. The patient agreed with the plan and demonstrated an understanding of the instructions.  Iona Beard , MD [phone]

## 2023-06-24 NOTE — ED Triage Notes (Signed)
 Pt coming in from home with abdominal pain, emesis , heartburn. Pt recently diagnosed with gull stones.

## 2023-06-24 NOTE — Consult Note (Addendum)
 Consultation  Referring Provider: ERMD/ Criss Alvine Primary Care Physician:  Benita Stabile, MD Primary Gastroenterologist:  Dr. Jena Gauss  Reason for Consultation: Severe abdominal pain, nausea vomiting, elevated LFTs, cholelithiasis  HPI: [name] is a 83 y.o. female with history of hypertension, hyperlipidemia, hypothyroidism, breast cancer, osteoarthritis, GERD with prior esophageal dilation, who presents to the emergency room today with persistent abdominal pain nausea and vomiting. She has known cholelithiasis and apparently has known for many years that she had gallstones but said she never had any symptoms until about 6 months ago.  Since then she had had an occasional episode of abdominal pain that she thought may have been gallbladder versus gas. Over the past couple of weeks she has had increased frequency of symptoms, and then Friday night 06/22/2023 she had a severe episode at home with pain in the right upper quadrant radiating to her shoulder blades and into her chest.  She says this made her feel somewhat short of breath, was constant and severe for at least 3 hours and associated with nausea and vomiting.  She called EMS and was checked out but did not decide to go to the emergency room. Says since then the pain has not been as bad but has not gone away she still nauseated and says she is uncomfortable laying flat and unable to eat. No documented fever or chills.  CT scan in the ER today shows multiple gallstones no gallbladder wall thickening, CBD of 10 mm and no definite choledocholithiasis.  She does have moderate stool in the colon.  Labs show WBC of 10.1/hemoglobin 12.9/hematocrit 37.9 Sodium 131/potassium 3.4 Creatinine 0.91 T. bili 2.8/alk phos 230/AST 335/ALT 590 Lipase 47 Troponins negative and UA negative.  Interestingly reviewing prior CTs, in 2023 she had a CT of her chest which showed gallstones and diffuse intra and extrahepatic ductal dilation.  MRCP was  suggested at that time. Repeat chest CT in November 2024 showed multiple gallstones, mild gallbladder wall thickening, CBD of 1.7 cm with intra and extrahepatic ductal dilation. Liver test for comparison in November 2023 were normal.  No blood thinners, no oxygen use   Past Medical History:  Diagnosis Date   Abnormal heart rhythms    Allergic rhinitis    Anxiety    Arthritis    Bronchitis    Chest wall pain    ANTERIOR   Depression    Dysuria    Eustachian tube dysfunction    Gallstones    Gallstones    GERD (gastroesophageal reflux disease)    Hyperlipidemia    Hypertension    Hypothyroidism    Hypoxemia    Incontinence    Liver function study, abnormal    Malaise and fatigue    Microscopic hematuria    Osteopenia    Personal history of colonic polyps    Primary breast malignancy (HCC)    Snoring    Tobacco abuse    Ulcer     Past Surgical History:  Procedure Laterality Date   BILATERAL MASTECTOMY     COLONOSCOPY  4/07   BJY:NWGNFAO internal hemorrhoids otherwise normal   COLONOSCOPY  05/25/2010   ZHY:QMVHQION hemorrhoids, otherwise normal rectum and colon.(POOR PREP). Due for surveillance 2017.    COLONOSCOPY  04/22/2002     RMR: Internal hemorrhoids, otherwise normal rectum  Polyps in the left colon resected with snare   COLONOSCOPY N/A 11/30/2017   Procedure: COLONOSCOPY;  Surgeon: Corbin Ade, MD;  Location: [facility];  Service:  Endoscopy;  Laterality: N/A;  12:00pm   ESOPHAGOGASTRODUODENOSCOPY  05/25/2010   RMR:EGD distal esophageal erosions consistent with mild erosive reflux esophagitis, otherwise unremarkable esophagus status post passage of a 56-French Maloney dilator/Small hiatal hernia/Gastric ulcer with negative H.pylori. Overdue for surveillance.    ESOPHAGOGASTRODUODENOSCOPY N/A 03/10/2013   Dr. Jena Gauss: Schatzki's ring s/p dilation, erosive reflux esophagitis, hiatal hernia, gastric erosions, negative h.pylori   ESOPHAGOGASTRODUODENOSCOPY N/A  11/30/2017   Procedure: ESOPHAGOGASTRODUODENOSCOPY (EGD);  Surgeon: Corbin Ade, MD;  Location: [facility];  Service: Endoscopy;  Laterality: N/A;   LEFT LEG SURGERY     MULTIPLE DUE TO FX   LEFT RTC TEAR     MALONEY DILATION N/A 03/10/2013   Procedure: [name] DILATION;  Surgeon: Corbin Ade, MD;  Location: [facility];  Service: Endoscopy;  Laterality: N/A;   MALONEY DILATION N/A 11/30/2017   Procedure: [name] DILATION;  Surgeon: Corbin Ade, MD;  Location: [facility];  Service: Endoscopy;  Laterality: N/A;   SAVORY DILATION N/A 03/10/2013   Procedure: SAVORY DILATION;  Surgeon: Corbin Ade, MD;  Location: [facility];  Service: Endoscopy;  Laterality: N/A;   TONSILECTOMY, ADENOIDECTOMY, BILATERAL MYRINGOTOMY AND TUBES     TONSILLECTOMY     VESICOVAGINAL FISTULA CLOSURE W/ TAH     excessive bleeding-benign    Prior to Admission medications   Medication Sig Start Date End Date Taking? Authorizing Provider  acetaminophen (TYLENOL) 500 MG tablet Take 1,000 mg by mouth daily as needed (for pain).   Yes [provider]  aspirin EC 81 MG tablet Take 81 mg by mouth daily.   Yes [provider]  amLODipine (NORVASC) 10 MG tablet Take 10 mg by mouth daily.      [provider]  atorvastatin (LIPITOR) 40 MG tablet Take 40 mg by mouth daily.    [provider]  carvedilol (COREG) 3.125 MG tablet Take 3.125 mg by mouth in the morning and at bedtime.    [provider]  Cholecalciferol (VITAMIN D3) 1.25 MG (50000 UT) CAPS Take 1,000 mcg by mouth.    [provider]  clonazePAM (KLONOPIN) 0.5 MG tablet Take 0.25 mg by mouth See admin instructions. Take 0.25 mg by mouth at bedtime and an additional 0.25 mg once a day as needed for anxiety    [provider]  diazepam (VALIUM) 5 MG tablet TAKE 1 TABLET BY MOUTH ONCE FOR 1 DOSE. TAKE 1 HOUR PRIOR TO SCAN.    [provider]  fexofenadine (ALLEGRA) 180 MG tablet  Take 180 mg by mouth daily.    [provider]  hydrochlorothiazide (HYDRODIURIL) 25 MG tablet Take 25 mg by mouth daily.    [provider]  levothyroxine (SYNTHROID, LEVOTHROID) 137 MCG tablet Take 137 mcg by mouth daily before breakfast.  09/19/17   [provider]  lisinopril (ZESTRIL) 40 MG tablet Take 40 mg by mouth daily.    [provider]  pantoprazole (PROTONIX) 40 MG tablet Take 1 tablet by mouth daily.    [provider]  vitamin B-12 (CYANOCOBALAMIN) 1000 MCG tablet Take 1,000 mcg by mouth daily.    [provider]    Current Facility-Administered Medications  Medication Dose Route Frequency Provider Last Rate Last Admin   0.9 %  sodium chloride infusion   Intravenous Continuous Smith, Rondell A, MD       enoxaparin (LOVENOX) injection 40 mg  40 mg Subcutaneous Q24H Esterwood, Amy S, PA-C  fentaNYL (SUBLIMAZE) injection 25 mcg  25 mcg Intravenous Q2H PRN Smith, Rondell A, MD       LORazepam (ATIVAN) injection 0.5 mg  0.5 mg Intravenous Once Esterwood, Amy S, PA-C       ondansetron (ZOFRAN) injection 4 mg  4 mg Intravenous Q6H PRN Esterwood, Amy S, PA-C       potassium chloride 10 mEq in 100 mL IVPB  10 mEq Intravenous Q1 Hr x 2 Smith, Rondell A, MD       sodium chloride flush (NS) 0.9 % injection 3 mL  3 mL Intravenous Q12H Smith, Rondell A, MD       Current Outpatient Medications  Medication Sig Dispense Refill   acetaminophen (TYLENOL) 500 MG tablet Take 1,000 mg by mouth daily as needed (for pain).     aspirin EC 81 MG tablet Take 81 mg by mouth daily.     amLODipine (NORVASC) 10 MG tablet Take 10 mg by mouth daily.       atorvastatin (LIPITOR) 40 MG tablet Take 40 mg by mouth daily.     carvedilol (COREG) 3.125 MG tablet Take 3.125 mg by mouth in the morning and at bedtime.     Cholecalciferol (VITAMIN D3) 1.25 MG (50000 UT) CAPS Take 1,000 mcg by mouth.     clonazePAM (KLONOPIN) 0.5 MG tablet Take 0.25 mg by mouth  See admin instructions. Take 0.25 mg by mouth at bedtime and an additional 0.25 mg once a day as needed for anxiety     diazepam (VALIUM) 5 MG tablet TAKE 1 TABLET BY MOUTH ONCE FOR 1 DOSE. TAKE 1 HOUR PRIOR TO SCAN.     fexofenadine (ALLEGRA) 180 MG tablet Take 180 mg by mouth daily.     hydrochlorothiazide (HYDRODIURIL) 25 MG tablet Take 25 mg by mouth daily.     levothyroxine (SYNTHROID, LEVOTHROID) 137 MCG tablet Take 137 mcg by mouth daily before breakfast.   5   lisinopril (ZESTRIL) 40 MG tablet Take 40 mg by mouth daily.     pantoprazole (PROTONIX) 40 MG tablet Take 1 tablet by mouth daily.     vitamin B-12 (CYANOCOBALAMIN) 1000 MCG tablet Take 1,000 mcg by mouth daily.      Allergies as of 06/24/2023 - Review Complete 06/24/2023  Allergen Reaction Noted   Sulfa antibiotics Anaphylaxis 06/24/2023    Family History  Problem Relation Age of Onset   Cancer Mother    Heart disease Father    Heart disease Brother    Cancer Sister    Aneurysm Sister    Colon cancer Neg Hx     Social History   Socioeconomic History   Marital status: Single    Spouse name: Not on file   Number of children: Not on file   Years of education: Not on file   Highest education level: Not on file  Occupational History   Occupation: retired  Tobacco Use   Smoking status: Every Day    Current packs/day: 0.25    Average packs/day: 0.3 packs/day for 25.0 years (6.3 ttl pk-yrs)    Types: Cigarettes   Smokeless tobacco: Never   Tobacco comments:    Smokes a pack of cigarettes about every 2-3 days  Vaping Use   Vaping status: Never Used  Substance and Sexual Activity   Alcohol use: No   Drug use: No   Sexual activity: Not Currently  Other Topics Concern   Not on file  Social History Narrative   Not on file  Social Drivers of Corporate investment banker Strain: Not on file  Food Insecurity: No Food Insecurity (06/24/2023)   Hunger Vital Sign    Worried About Running Out of Food in the Last  Year: Never true    Ran Out of Food in the Last Year: Never true  Transportation Needs: No Transportation Needs (06/24/2023)   PRAPARE - Administrator, Civil Service (Medical): No    Lack of Transportation (Non-Medical): No  Physical Activity: Not on file  Stress: Not on file  Social Connections: Moderately Integrated (06/24/2023)   Social Connection and Isolation Panel [NHANES]    Frequency of Communication with Friends and Family: More than three times a week    Frequency of Social Gatherings with Friends and Family: More than three times a week    Attends Religious Services: More than 4 times per year    Active Member of [employer] or Organizations: Yes    Attends Banker Meetings: More than 4 times per year    Marital Status: Widowed  Intimate Partner Violence: Not At Risk (06/24/2023)   Humiliation, Afraid, Rape, and Kick questionnaire    Fear of Current or Ex-Partner: No    Emotionally Abused: No    Physically Abused: No    Sexually Abused: No    Review of Systems: Pertinent positive and negative review of systems were noted in the above HPI section.  All other review of systems was otherwise negative.   Physical Exam: Vital signs in last 24 hours: Temp:  [98.3 F (36.8 C)-98.4 F (36.9 C)] 98.4 F (36.9 C) (03/02 1200) Pulse Rate:  [66-78] 75 (03/02 1445) Resp:  [16-23] 19 (03/02 1445) BP: (121-136)/(50-59) 133/59 (03/02 1430) SpO2:  [96 %-99 %] 96 % (03/02 1445) Weight:  [81.6 kg] 81.6 kg (03/02 0955)   General:   Alert,  Well-developed, well-nourished elderly white female, pleasant and cooperative in NAD, talkative, family at bedside Head:  Normocephalic and atraumatic. Eyes:  Sclera icterus.   Conjunctiva pink. Ears:  Normal auditory acuity. Nose:  No deformity, discharge,  or lesions. Mouth:  No deformity or lesions.   Neck:  Supple; no masses or thyromegaly. Lungs:   Clear throughout to auscultation.   No wheezes, crackles, or rhonchi. Heart:   Regular rate and rhythm; no murmurs, clicks, rubs,  or gallops. Abdomen:  Soft, obese, she is tender in the right upper quadrant and epigastrium no guarding or rebound, BS active,nonpalp mass or hsm.  Lower right of midline  incisional scar, umbilical hernia present Rectal: Not done Msk:  Symmetrical without gross deformities. . Pulses:  Normal pulses noted. Extremities:  Without clubbing or edema. Neurologic:  Alert and  oriented x4;  grossly normal neurologically. Skin:  Intact without significant lesions or rashes.. Psych:  Alert and cooperative. Normal mood and affect.  Intake/Output from previous day: No intake/output data recorded. Intake/Output this shift: No intake/output data recorded.  Lab Results: Recent Labs    06/24/23 1003  WBC 10.1  HGB 12.9  HCT 37.9  PLT 247   BMET Recent Labs    06/24/23 1003  NA 131*  K 3.4*  CL 94*  CO2 27  GLUCOSE 108*  BUN 18  CREATININE 0.91  CALCIUM 9.4   LFT Recent Labs    06/24/23 1003  PROT 6.8  ALBUMIN 3.4*  AST 335*  ALT 590*  ALKPHOS 230*  BILITOT 2.8*   PT/INR No results for input(s): "LABPROT", "INR" in the last 72 hours.  Hepatitis Panel No results for input(s): "HEPBSAG", "HCVAB", "HEPAIGM", "HEPBIGM" in the last 72 hours.    IMPRESSION:  #40 83 year old white female with known cholelithiasis, who has been having intermittent mild abdominal pain over the past 6 months, abrupt increase in symptoms over the past week and then a severe episode on Friday night 06/22/2023 with right upper quadrant pain radiating between the shoulder blades and into her chest, severe and lasted for about 3 hours associated with nausea and vomiting. She has not felt well since, still having discomfort but not severe pain, continues to feel nauseated and has vomited a few times.  No fever or chills.  CT today with multiple gallstones, no gallbladder wall thickening and CBD of 10 mm without definite choledocholithiasis. Normal  CBC Elevated LFTs with T. bili of 2.8/alk phos 230/AST 335 and ALT 590  Very consistent with choledocholithiasis no evidence for cholangitis at this time  # 2 history of breast cancer-monitoring enlarging pulmonary  Nodules  #3 hypertension #4.  Hyperlipidemia #5.  Chronic GERD status post prior esophageal dilation #6.  History of colon polyps #7.  Status post extensive surgeries to her left lower extremity skin grafting  Plan; stat MRI/MRCP-will need Ativan preprocedure Clear liquid diet this evening then n.p.o. after midnight Check repeat labs in a.m. to include INR Hold Lovenox as likely will have ERCP tomorrow Will tentatively schedule for ERCP with stone extraction for Dr. Meridee Score  tomorrow afternoon.  I discussed ERCP in detail with the patient including indications risks and benefits, we reviewed biliary images.  She is agreeable to proceed. Also discussed that she should have cholecystectomy this admission, please ask surgery to see her tomorrow  GI will follow with you     Amy Esterwood PA-C 06/24/2023, 3:44 PM   Attending physician's note  I have taken a history, reviewed the chart and examined the patient. I performed a substantive portion of this encounter, including complete performance of at least one of the key components, in conjunction with the APP. I agree with the APP's note, impression and recommendations.    84 year old female with known cholelithiasis with worsening intermittent abdominal pain, dilated CBD Will need to exclude choledocholithiasis, will obtain MRCP, tentatively plan for ERCP tomorrow based on MRCP findings N.p.o. after midnight  The patient was provided an opportunity to ask questions and all were answered. The patient agreed with the plan and demonstrated an understanding of the instructions.  Iona Beard , MD [phone]

## 2023-06-24 NOTE — ED Provider Notes (Signed)
 [place] [facility] AT [facility] Provider Note   CSN: [id number] Arrival date & time: 06/24/23  4401     History  Chief Complaint  Patient presents with   Abdominal Pain   Heartburn    [name] is a 83 y.o. female.  HPI 83 year old female presents with abdominal pain and nausea.  She has had a lot of abdominal and swallowing issues for quite some time that she attributes to reflux as well as gallstones.  She is also had to have her esophagus stretched.  However starting 3 nights ago she developed abdominal pain and her chest was hurting and her right arm was hurting.  EMS was called but ultimately did not transport her.  She has had abdominal pain ever since.  This morning she was spitting up a little bit of water that she tried to drink.  The pain is primarily in her lower abdomen and she is having nausea without vomiting.  No further arm pain, and she attributes that previous arm pain to her previous shoulder surgery.  No current chest pain or shortness of breath.  She has a chronic but unchanged cough.  Home Medications Prior to Admission medications   Medication Sig Start Date End Date Taking? Authorizing Provider  acetaminophen (TYLENOL) 500 MG tablet Take 500-1,000 mg by mouth daily as needed (for arthritis pain.).    [provider]  amLODipine (NORVASC) 10 MG tablet Take 10 mg by mouth daily.      [provider]  aspirin EC 81 MG tablet Take 81 mg by mouth daily.    [provider]  atorvastatin (LIPITOR) 40 MG tablet Take 40 mg by mouth daily.    [provider]  carvedilol (COREG) 3.125 MG tablet Take 3.125 mg by mouth 2 (two) times daily with a meal.    [provider]  Cholecalciferol (VITAMIN D3) 1.25 MG (50000 UT) CAPS Take 1,000 mcg by mouth.    [provider]  clonazePAM (KLONOPIN) 0.5 MG tablet Take 1 tablet by mouth daily as needed.    [provider]  diazepam (VALIUM) 5 MG  tablet TAKE 1 TABLET BY MOUTH ONCE FOR 1 DOSE. TAKE 1 HOUR PRIOR TO SCAN.    [provider]  fexofenadine (ALLEGRA) 180 MG tablet Take 180 mg by mouth daily.    [provider]  hydrochlorothiazide (HYDRODIURIL) 25 MG tablet Take 25 mg by mouth daily.    [provider]  levothyroxine (SYNTHROID, LEVOTHROID) 137 MCG tablet Take 137 mcg by mouth daily before breakfast.  09/19/17   [provider]  lisinopril (ZESTRIL) 40 MG tablet Take 40 mg by mouth daily.    [provider]  pantoprazole (PROTONIX) 40 MG tablet Take 1 tablet by mouth daily.    [provider]  vitamin B-12 (CYANOCOBALAMIN) 1000 MCG tablet Take 1,000 mcg by mouth daily.    [provider]      Allergies    Sulfonamide derivatives    Review of Systems   Review of Systems  Respiratory:  Negative for shortness of breath.   Cardiovascular:  Negative for chest pain.  Gastrointestinal:  Positive for abdominal pain and nausea.  Musculoskeletal:  Negative for back pain.    Physical Exam Updated Vital Signs BP (!) 133/59   Pulse 75   Temp 98.4 F (36.9 C)   Resp 19   Ht 5\' 3"  (1.6 m)   Wt 81.6 kg   SpO2 96%  BMI 31.89 kg/m  Physical Exam Vitals and nursing note reviewed.  Constitutional:      General: She is not in acute distress.    Appearance: She is well-developed. She is not ill-appearing or diaphoretic.  HENT:     Head: Normocephalic and atraumatic.  Cardiovascular:     Rate and Rhythm: Normal rate and regular rhythm.     Heart sounds: Normal heart sounds.  Pulmonary:     Effort: Pulmonary effort is normal.     Breath sounds: Normal breath sounds.  Abdominal:     Palpations: Abdomen is soft.     Tenderness: There is abdominal tenderness in the right lower quadrant, suprapubic area and left lower quadrant.  Skin:    General: Skin is warm and dry.  Neurological:     Mental Status: She is alert.     ED Results / Procedures / Treatments    Labs (all labs ordered are listed, but only abnormal results are displayed) Labs Reviewed  COMPREHENSIVE METABOLIC PANEL - Abnormal; Notable for the following components:      Result Value   Sodium 131 (*)    Potassium 3.4 (*)    Chloride 94 (*)    Glucose, Bld 108 (*)    Albumin 3.4 (*)    AST 335 (*)    ALT 590 (*)    Alkaline Phosphatase 230 (*)    Total Bilirubin 2.8 (*)    All other components within normal limits  CBC WITH DIFFERENTIAL/PLATELET - Abnormal; Notable for the following components:   Monocytes Absolute 1.3 (*)    All other components within normal limits  LIPASE, BLOOD  URINALYSIS, W/ REFLEX TO CULTURE (INFECTION SUSPECTED)  TROPONIN I (HIGH SENSITIVITY)  TROPONIN I (HIGH SENSITIVITY)    EKG EKG Interpretation Date/Time:  Sunday June 24 2023 10:39:48 EST Ventricular Rate:  72 PR Interval:  177 QRS Duration:  83 QT Interval:  409 QTC Calculation: 448 R Axis:   56  Text Interpretation: Sinus rhythm Borderline T abnormalities, anterior leads No old tracing to compare Confirmed by Pricilla Loveless [phone]) on 06/24/2023 11:23:13 AM  Radiology CT ABDOMEN PELVIS W CONTRAST Result Date: 06/24/2023 CLINICAL DATA:  Right and left lower quadrant abdominal pain EXAM: CT ABDOMEN AND PELVIS WITH CONTRAST TECHNIQUE: Multidetector CT imaging of the abdomen and pelvis was performed using the standard protocol following bolus administration of intravenous contrast. RADIATION DOSE REDUCTION: This exam was performed according to the departmental dose-optimization program which includes automated exposure control, adjustment of the mA and/or kV according to patient size and/or use of iterative reconstruction technique. CONTRAST:  75mL OMNIPAQUE IOHEXOL 350 MG/ML SOLN COMPARISON:  09/02/2011 FINDINGS: Lower chest: No acute pleural or parenchymal lung disease. Trace pericardial fluid unchanged. Hepatobiliary: Multiple calcified gallstones are noted. No evidence of gallbladder wall  thickening or pericholecystic fluid to suggest cholecystitis. Mild biliary dilation, with common bile duct measuring 10 mm, likely appropriate for age. No evidence of choledocholithiasis. The liver is unremarkable. Pancreas: Unremarkable. No pancreatic ductal dilatation or surrounding inflammatory changes. Spleen: Normal in size without focal abnormality. Adrenals/Urinary Tract: Stable bilateral adrenal thickening consistent with adenomas or hyperplasia. Bilateral renal cortical thinning. No urinary tract calculi or obstructive uropathy. Bladder is moderately distended without focal abnormality. Stomach/Bowel: No bowel obstruction or ileus. Normal appendix right lower quadrant. Moderate retained stool throughout the colon. No bowel wall thickening or inflammatory change. Vascular/Lymphatic: Aortic atherosclerosis. No enlarged abdominal or pelvic lymph nodes. Reproductive: Status post hysterectomy. No adnexal masses. Other: No free  fluid or free intraperitoneal gas. Fat containing umbilical hernia. Musculoskeletal: No acute or destructive bony abnormalities. Reconstructed images demonstrate no additional findings. IMPRESSION: 1. Cholelithiasis without evidence of acute cholecystitis. 2. Biliary duct dilation, likely appropriate for age. No evidence of choledocholithiasis. 3. Moderate fecal retention consistent with constipation. No bowel obstruction or ileus. 4. Stable fat containing umbilical hernia. 5.  Aortic Atherosclerosis (ICD10-I70.0). Electronically Signed   By: Sharlet Salina M.D.   On: 06/24/2023 14:28    Procedures Procedures    Medications Ordered in ED Medications  iohexol (OMNIPAQUE) 350 MG/ML injection 75 mL (75 mLs Intravenous Contrast Given 06/24/23 1357)    ED Course/ Medical Decision Making/ A&P                                 Medical Decision Making Amount and/or Complexity of Data Reviewed Labs: ordered.    Details: Abnormal LFTs including AST, ALT, alk phos, bilirubin.  Normal  WBC Radiology: ordered and independent interpretation performed.    Details: Cholelithiasis with biliary duct dilation ECG/medicine tests: ordered and independent interpretation performed.    Details: No anemia  Risk Prescription drug management. Decision regarding hospitalization.   I suspect patient's abdominal pain is from choledocholithiasis.  She is not ill-appearing and this does not appear to be cholangitis.  I have discussed with gastroenterology, Mike Gip, who will evaluate patient.  For now they will evaluate and decide about MRCP.  Otherwise patient will need medical admission and I have consulted the hospitalist, Dr. Katrinka Blazing.        Final Clinical Impression(s) / ED Diagnoses Final diagnoses:  Choledocholithiasis    Rx / DC Orders ED Discharge Orders     None         Pricilla Loveless, MD 06/24/23 1528

## 2023-06-24 NOTE — H&P (Addendum)
 History and Physical    Patient: [name] DOB: [birth year] DOA: 06/24/2023 DOS: the patient was seen and examined on 06/24/2023 PCP: Benita Stabile, MD  Patient coming from: Home  Chief Complaint:  Chief Complaint  Patient presents with   Abdominal Pain   Heartburn   HPI: [name] is a 83 y.o. female with medical history significant of hypertension, hyperlipidemia, cholelithiasis, anxiety, depression, GERD presents with complaints of right upper quadrant abdominal pain.  She has been experiencing intermittent when she calls "gallbladder attacks" over the last several months, with a history of knowing she has had gallstones for over sixteen years. Recently, the attacks have increased in frequency, with the most severe episode occurring on yesterday morning.  Patient reported pain was in the right upper quadrant and seem to radiate to her shoulder blades and into her chest.  Associated symptoms of nausea and vomiting  She experiences significant anxiety related to MRI procedures, stemming from past experiences where she felt she could not breathe and was about to pass out. Although she has previously been given Valium for anxiety, she did not receive it today. She currently takes Klonopin at bedtime, with a dose of 0.25 mg.    Her surgical history includes multiple surgeries, such as nine surgeries to save her left leg and a hernia repair. She also has a port-a-cath for medication administration due to a past leg infection that required IV antibiotics.  She reports smoking two packs of cigarettes every two weeks but states that she does not smoke heavily and has not thought about cigarettes since being in the hospital. She denies needing a nicotine patch while hospitalized.  In the emergency department patient was noted to be afebrile, blood pressure 136/52, and oxygen saturation maintained at 96%. Labs significant for WBC 10.1, sodium 131, potassium 3.4, alkaline  phosphatase 230, AST 335, ALT 590, total bilirubin 2.8, lipase 47, and high-sensitivity troponins negative x 2 .CT scan of the abdomen pelvis noted cholelithiasis without evidence of acute cholecystitis, biliary duct dilation that was thought to be age-appropriate, and moderate fecal retention consistent with constipation without obstruction, stable fat-containing umbilical hernia.  Urinalysis did not show any significant signs for infection.  Review of Systems: As mentioned in the history of present illness. All other systems reviewed and are negative. Past Medical History:  Diagnosis Date   Abnormal heart rhythms    Allergic rhinitis    Anxiety    Arthritis    Bronchitis    Chest wall pain    ANTERIOR   Depression    Dysuria    Eustachian tube dysfunction    Gallstones    Gallstones    GERD (gastroesophageal reflux disease)    Hyperlipidemia    Hypertension    Hypothyroidism    Hypoxemia    Incontinence    Liver function study, abnormal    Malaise and fatigue    Microscopic hematuria    Osteopenia    Personal history of colonic polyps    Primary breast malignancy (HCC)    Snoring    Tobacco abuse    Ulcer    Past Surgical History:  Procedure Laterality Date   BILATERAL MASTECTOMY     COLONOSCOPY  4/07   QVZ:DGLOVFI internal hemorrhoids otherwise normal   COLONOSCOPY  05/25/2010   EPP:IRJJOACZ hemorrhoids, otherwise normal rectum and colon.(POOR PREP). Due for surveillance 2017.    COLONOSCOPY  04/22/2002     RMR: Internal hemorrhoids, otherwise normal rectum  Polyps  in the left colon resected with snare   COLONOSCOPY N/A 11/30/2017   Procedure: COLONOSCOPY;  Surgeon: Corbin Ade, MD;  Location: [facility];  Service: Endoscopy;  Laterality: N/A;  12:00pm   ESOPHAGOGASTRODUODENOSCOPY  05/25/2010   RMR:EGD distal esophageal erosions consistent with mild erosive reflux esophagitis, otherwise unremarkable esophagus status post passage of a 56-French Maloney  dilator/Small hiatal hernia/Gastric ulcer with negative H.pylori. Overdue for surveillance.    ESOPHAGOGASTRODUODENOSCOPY N/A 03/10/2013   Dr. Jena Gauss: Schatzki's ring s/p dilation, erosive reflux esophagitis, hiatal hernia, gastric erosions, negative h.pylori   ESOPHAGOGASTRODUODENOSCOPY N/A 11/30/2017   Procedure: ESOPHAGOGASTRODUODENOSCOPY (EGD);  Surgeon: Corbin Ade, MD;  Location: [facility];  Service: Endoscopy;  Laterality: N/A;   LEFT LEG SURGERY     MULTIPLE DUE TO FX   LEFT RTC TEAR     MALONEY DILATION N/A 03/10/2013   Procedure: [name] DILATION;  Surgeon: Corbin Ade, MD;  Location: [facility];  Service: Endoscopy;  Laterality: N/A;   MALONEY DILATION N/A 11/30/2017   Procedure: [name] DILATION;  Surgeon: Corbin Ade, MD;  Location: [facility];  Service: Endoscopy;  Laterality: N/A;   SAVORY DILATION N/A 03/10/2013   Procedure: SAVORY DILATION;  Surgeon: Corbin Ade, MD;  Location: [facility];  Service: Endoscopy;  Laterality: N/A;   TONSILECTOMY, ADENOIDECTOMY, BILATERAL MYRINGOTOMY AND TUBES     TONSILLECTOMY     VESICOVAGINAL FISTULA CLOSURE W/ TAH     excessive bleeding-benign   Social History:  reports that she has been smoking cigarettes. She has a 6.3 pack-year smoking history. She has never used smokeless tobacco. She reports that she does not drink alcohol and does not use drugs.  Allergies  Allergen Reactions   Sulfonamide Derivatives Anaphylaxis    Family History  Problem Relation Age of Onset   Cancer Mother    Heart disease Father    Heart disease Brother    Cancer Sister    Aneurysm Sister    Colon cancer Neg Hx     Prior to Admission medications   Medication Sig Start Date End Date Taking? Authorizing Provider  acetaminophen (TYLENOL) 500 MG tablet Take 500-1,000 mg by mouth daily as needed (for arthritis pain.).    [provider]  amLODipine (NORVASC) 10 MG tablet Take 10 mg by mouth daily.      [provider]  aspirin EC 81 MG tablet Take 81 mg by mouth daily.    [provider]  atorvastatin (LIPITOR) 40 MG tablet Take 40 mg by mouth daily.    [provider]  carvedilol (COREG) 3.125 MG tablet Take 3.125 mg by mouth 2 (two) times daily with a meal.    [provider]  Cholecalciferol (VITAMIN D3) 1.25 MG (50000 UT) CAPS Take 1,000 mcg by mouth.    [provider]  clonazePAM (KLONOPIN) 0.5 MG tablet Take 1 tablet by mouth daily as needed.    [provider]  diazepam (VALIUM) 5 MG tablet TAKE 1 TABLET BY MOUTH ONCE FOR 1 DOSE. TAKE 1 HOUR PRIOR TO SCAN.    [provider]  fexofenadine (ALLEGRA) 180 MG tablet Take 180 mg by mouth daily.    [provider]  hydrochlorothiazide (HYDRODIURIL) 25 MG tablet Take 25 mg by mouth daily.    [provider]  levothyroxine (SYNTHROID, LEVOTHROID) 137 MCG tablet Take 137 mcg by mouth daily before breakfast.  09/19/17   [provider]  lisinopril (ZESTRIL) 40 MG  tablet Take 40 mg by mouth daily.    [provider]  pantoprazole (PROTONIX) 40 MG tablet Take 1 tablet by mouth daily.    [provider]  vitamin B-12 (CYANOCOBALAMIN) 1000 MCG tablet Take 1,000 mcg by mouth daily.    [provider]    Physical Exam: Vitals:   06/24/23 1200 06/24/23 1253 06/24/23 1300 06/24/23 1330  BP: (!) 133/50 (!) 121/59 (!) 136/52   Pulse: 69 69 72 66  Resp: (!) 23 19 (!) 22 18  Temp: 98.4 F (36.9 C)     TempSrc:      SpO2: 97% 98% 96% 98%  Weight:      Height:        Constitutional: Elderly female who appears to be in no acute distress at this time Eyes: PERRL, lids and conjunctivae normal ENMT: Mucous membranes are moist.  Normal dentition.  Neck: normal, supple  Respiratory: Normal respiratory effort with decreased overall aeration.  Able to talk in complete sentences. Cardiovascular: Regular rate and rhythm, no murmurs / rubs / gallops. No extremity  edema. 2+ pedal pulses. No carotid bruits.  Abdomen: no tenderness, no masses palpated.  Reducible umbilical hernia present. Bowel sounds positive.  Musculoskeletal: no clubbing / cyanosis.  Deformity of the distal aspect of the left leg without open wound. Skin: no rashes, lesions, ulcers. No induration Neurologic: CN 2-12 grossly intact. Strength 5/5 in all 4.  Psychiatric: Normal judgment and insight. Alert and oriented x 3.  Anxious mood.   Data Reviewed:  EKG reveals sinus rhythm at 72 bpm.  Reviewed labs, imaging, and pertinent records as documented.  Assessment and Plan:  Suspected choledocholithiasis with obstruction Elevated liver function test Acute.  Patient presents with complaints of severe right upper quadrant pain with nausea and vomiting.  Reports no known history of cholelithiasis.  CT scan noted cholelithiasis without evidence of acute cholecystitis and biliary duct dilation that was thought to be age-appropriate.  Labs noted alkaline phosphatase 230, AST 335, ALT 590, total bilirubin 2.8, and lipase 47 to suggest possibility of gallstone.  Gastroenterology have been formally consulted for possible need of ERCP. -Admit to a medical telemetry bed -Clear liquid diet and n.p.o. after midnight for possible procedure in a.m. -Check MRCP -Normal saline IV fluids at 100 mL/h -Fentanyl IV as needed for pain -Antiemetics as needed -Repeat LFTs in a.m. -Appreciate GI consultative services we will follow-up for any further recommendations.  Hyponatremia Hypokalemia Hypokalemia Acute.  Labs noted sodium 131, potassium 3.4, and chloride  94. -Potassium chloride 20 mEq IV -Normal saline IV fluids    Essential hypertension Blood pressures 121/59 to 136/52.  Patient's initial blood pressures were noted to be within normal limits. -Continue to monitor and determine when medically appropriate to resume home regimen  Hypothyroidism -Continue levothyroxine  Hyperlipidemia -Hold  atorvastatin due to elevated liver enzymes  Anxiety Patient has claustrophobia with extreme fear of MRIs. -Ativan IV as needed for MRI -Continue clonazepam at night  Pulmonary nodule Patient was noted to have stable groundglass nodule posteriorly in the right upper lobe and large on recent scan from 03/01/2023. -Encouraged patient to have repeat CT scan in 12 months given tobacco history  History of breast cancer S/p bilateral mastectomy  GERD Patient with prior history of esophageal dilation -Continue Protonix  Tobacco abuse Patient reports smoking a couple cigarettes per day on average.  She declines need of nicotine patch. -Continue to counsel need of cessation of tobacco use  DVT prophylaxis:  Lovenox Advance Care Planning:   Code Status: Full Code    Consults: GI  Family Communication: Daughter updated at bedside  Severity of Illness: The appropriate patient status for this patient is INPATIENT. Inpatient status is judged to be reasonable and necessary in order to provide the required intensity of service to ensure the patient's safety. The patient's presenting symptoms, physical exam findings, and initial radiographic and laboratory data in the context of their chronic comorbidities is felt to place them at high risk for further clinical deterioration. Furthermore, it is not anticipated that the patient will be medically stable for discharge from the hospital within 2 midnights of admission.   * I certify that at the point of admission it is my clinical judgment that the patient will require inpatient hospital care spanning beyond 2 midnights from the point of admission due to high intensity of service, high risk for further deterioration and high frequency of surveillance required.*  Author: Clydie Braun, MD 06/24/2023 2:54 PM  For on call review [email or web address].

## 2023-06-24 NOTE — ED Notes (Signed)
 RN called about pts labs. Lab states they will run labs now

## 2023-06-25 ENCOUNTER — Inpatient Hospital Stay (HOSPITAL_COMMUNITY): Admitting: Anesthesiology

## 2023-06-25 ENCOUNTER — Inpatient Hospital Stay (HOSPITAL_COMMUNITY)

## 2023-06-25 ENCOUNTER — Encounter (HOSPITAL_COMMUNITY)
Admission: EM | Disposition: A | Discharge status: Home / Self Care | Payer: Self-pay | Source: Home / Self Care | Attending: Internal Medicine

## 2023-06-25 ENCOUNTER — Other Ambulatory Visit: Payer: Self-pay

## 2023-06-25 ENCOUNTER — Encounter (HOSPITAL_COMMUNITY): Payer: Self-pay | Admitting: Internal Medicine

## 2023-06-25 DIAGNOSIS — K8045 Calculus of bile duct with chronic cholecystitis with obstruction: Secondary | ICD-10-CM | POA: Diagnosis not present

## 2023-06-25 DIAGNOSIS — K819 Cholecystitis, unspecified: Secondary | ICD-10-CM

## 2023-06-25 HISTORY — PX: CHOLECYSTECTOMY: SHX55

## 2023-06-25 LAB — CBC WITH DIFFERENTIAL/PLATELET
Abs Immature Granulocytes: 0.03 10*3/uL (ref 0.00–0.07)
Basophils Absolute: 0.1 10*3/uL (ref 0.0–0.1)
Basophils Relative: 1 %
Eosinophils Absolute: 0.5 10*3/uL (ref 0.0–0.5)
Eosinophils Relative: 6 %
HCT: 34.4 % — ABNORMAL LOW (ref 36.0–46.0)
Hemoglobin: 11.6 g/dL — ABNORMAL LOW (ref 12.0–15.0)
Immature Granulocytes: 0 %
Lymphocytes Relative: 19 %
Lymphs Abs: 1.4 10*3/uL (ref 0.7–4.0)
MCH: 29.7 pg (ref 26.0–34.0)
MCHC: 33.7 g/dL (ref 30.0–36.0)
MCV: 88 fL (ref 80.0–100.0)
Monocytes Absolute: 1 10*3/uL (ref 0.1–1.0)
Monocytes Relative: 14 %
Neutro Abs: 4.6 10*3/uL (ref 1.7–7.7)
Neutrophils Relative %: 60 %
Platelets: 205 10*3/uL (ref 150–400)
RBC: 3.91 MIL/uL (ref 3.87–5.11)
RDW: 13.7 % (ref 11.5–15.5)
WBC: 7.6 10*3/uL (ref 4.0–10.5)
nRBC: 0 % (ref 0.0–0.2)

## 2023-06-25 LAB — COMPREHENSIVE METABOLIC PANEL
ALT: 354 U/L — ABNORMAL HIGH (ref 0–44)
AST: 150 U/L — ABNORMAL HIGH (ref 15–41)
Albumin: 2.9 g/dL — ABNORMAL LOW (ref 3.5–5.0)
Alkaline Phosphatase: 175 U/L — ABNORMAL HIGH (ref 38–126)
Anion gap: 10 (ref 5–15)
BUN: 12 mg/dL (ref 8–23)
CO2: 24 mmol/L (ref 22–32)
Calcium: 9 mg/dL (ref 8.9–10.3)
Chloride: 98 mmol/L (ref 98–111)
Creatinine, Ser: 0.64 mg/dL (ref 0.44–1.00)
GFR, Estimated: >60 mL/min (ref >60)
Glucose, Bld: 96 mg/dL (ref 70–99)
Potassium: 3.4 mmol/L — ABNORMAL LOW (ref 3.5–5.1)
Sodium: 132 mmol/L — ABNORMAL LOW (ref 135–145)
Total Bilirubin: 1.4 mg/dL — ABNORMAL HIGH (ref 0.0–1.2)
Total Protein: 5.9 g/dL — ABNORMAL LOW (ref 6.5–8.1)

## 2023-06-25 LAB — PROTIME-INR
INR: 0.9 (ref 0.8–1.2)
Prothrombin Time: 12.7 s (ref 11.4–15.2)

## 2023-06-25 LAB — SURGICAL PCR SCREEN
MRSA, PCR: NEGATIVE
Staphylococcus aureus: NEGATIVE

## 2023-06-25 SURGERY — LAPAROSCOPIC CHOLECYSTECTOMY WITH INTRAOPERATIVE CHOLANGIOGRAM
Anesthesia: General | Site: Abdomen

## 2023-06-25 MED ORDER — PHENYLEPHRINE HCL-NACL 20-0.9 MG/250ML-% IV SOLN
INTRAVENOUS | Status: DC | PRN
  Administered 2023-06-25: 25 ug/min via INTRAVENOUS

## 2023-06-25 MED ORDER — DROPERIDOL 2.5 MG/ML IJ SOLN: 0.6250 mg | Freq: Once | INTRAMUSCULAR | Status: DC | PRN

## 2023-06-25 MED ORDER — ACETAMINOPHEN 500 MG PO TABS: 1000.0000 mg | ORAL_TABLET | ORAL | Status: AC

## 2023-06-25 MED ORDER — ONDANSETRON HCL 4 MG/2ML IJ SOLN
INTRAMUSCULAR | Status: DC | PRN
  Administered 2023-06-25: 4 mg via INTRAVENOUS

## 2023-06-25 MED ORDER — PROPOFOL 10 MG/ML IV BOLUS
INTRAVENOUS | Status: DC | PRN
  Administered 2023-06-25: 100 mg via INTRAVENOUS

## 2023-06-25 MED ORDER — 0.9 % SODIUM CHLORIDE (POUR BTL) OPTIME
TOPICAL | Status: DC | PRN
  Administered 2023-06-25: 1000 mL

## 2023-06-25 MED ORDER — DEXAMETHASONE SODIUM PHOSPHATE 10 MG/ML IJ SOLN
INTRAMUSCULAR | Status: AC
  Filled 2023-06-25: qty 1

## 2023-06-25 MED ORDER — ACETAMINOPHEN 500 MG PO TABS
1000.0000 mg | ORAL_TABLET | Freq: Four times a day (QID) | ORAL | Status: DC
  Administered 2023-06-25 – 2023-06-27 (×7): 1000 mg via ORAL
  Filled 2023-06-25 (×7): qty 2

## 2023-06-25 MED ORDER — SUGAMMADEX SODIUM 200 MG/2ML IV SOLN
INTRAVENOUS | Status: AC
  Filled 2023-06-25: qty 2

## 2023-06-25 MED ORDER — CEFAZOLIN SODIUM 1 G IJ SOLR
INTRAMUSCULAR | Status: AC
  Filled 2023-06-25: qty 40

## 2023-06-25 MED ORDER — ROCURONIUM BROMIDE 10 MG/ML (PF) SYRINGE
PREFILLED_SYRINGE | INTRAVENOUS | Status: DC | PRN
  Administered 2023-06-25: 50 mg via INTRAVENOUS

## 2023-06-25 MED ORDER — SODIUM CHLORIDE 0.9 % IV SOLN: INTRAVENOUS | Status: AC

## 2023-06-25 MED ORDER — OXYCODONE HCL 5 MG PO TABS: 5.0000 mg | ORAL_TABLET | Freq: Once | ORAL | Status: DC | PRN

## 2023-06-25 MED ORDER — DEXAMETHASONE SODIUM PHOSPHATE 10 MG/ML IJ SOLN
INTRAMUSCULAR | Status: DC | PRN
  Administered 2023-06-25: 4 mg via INTRAVENOUS

## 2023-06-25 MED ORDER — LIDOCAINE 2% (20 MG/ML) 5 ML SYRINGE
INTRAMUSCULAR | Status: DC | PRN
  Administered 2023-06-25: 80 mg via INTRAVENOUS

## 2023-06-25 MED ORDER — BUPIVACAINE-EPINEPHRINE (PF) 0.25% -1:200000 IJ SOLN
INTRAMUSCULAR | Status: AC
  Filled 2023-06-25: qty 30

## 2023-06-25 MED ORDER — MORPHINE SULFATE (PF) 2 MG/ML IV SOLN: 1.0000 mg | INTRAVENOUS | Status: DC | PRN

## 2023-06-25 MED ORDER — PHENYLEPHRINE 80 MCG/ML (10ML) SYRINGE FOR IV PUSH (FOR BLOOD PRESSURE SUPPORT)
PREFILLED_SYRINGE | INTRAVENOUS | Status: DC | PRN
  Administered 2023-06-25: 80 ug via INTRAVENOUS

## 2023-06-25 MED ORDER — LIDOCAINE 2% (20 MG/ML) 5 ML SYRINGE
INTRAMUSCULAR | Status: AC
  Filled 2023-06-25: qty 5

## 2023-06-25 MED ORDER — CHLORHEXIDINE GLUCONATE 0.12 % MT SOLN
15.0000 mL | Freq: Once | OROMUCOSAL | Status: AC
  Administered 2023-06-25: 15 mL via OROMUCOSAL

## 2023-06-25 MED ORDER — SODIUM CHLORIDE 0.9 % IR SOLN
Status: DC | PRN
  Administered 2023-06-25: 1000 mL

## 2023-06-25 MED ORDER — OXYCODONE HCL 5 MG PO TABS
5.0000 mg | ORAL_TABLET | ORAL | Status: DC | PRN
  Administered 2023-06-26: 5 mg via ORAL
  Filled 2023-06-25 (×2): qty 1

## 2023-06-25 MED ORDER — ONDANSETRON HCL 4 MG/2ML IJ SOLN: 4.0000 mg | Freq: Once | INTRAMUSCULAR | Status: DC | PRN

## 2023-06-25 MED ORDER — ORAL CARE MOUTH RINSE: 15.0000 mL | Freq: Once | OROMUCOSAL | Status: AC

## 2023-06-25 MED ORDER — SPY AGENT GREEN - (INDOCYANINE FOR INJECTION)
1.2500 mg | Freq: Once | INTRAMUSCULAR | Status: AC
  Administered 2023-06-25: 1.25 mg via INTRAVENOUS
  Filled 2023-06-25: qty 10

## 2023-06-25 MED ORDER — SUGAMMADEX SODIUM 200 MG/2ML IV SOLN
INTRAVENOUS | Status: DC | PRN
  Administered 2023-06-25: 200 mg via INTRAVENOUS

## 2023-06-25 MED ORDER — ACETAMINOPHEN 500 MG PO TABS
ORAL_TABLET | ORAL | Status: AC
  Administered 2023-06-25: 1000 mg via ORAL
  Filled 2023-06-25: qty 2

## 2023-06-25 MED ORDER — FENTANYL CITRATE (PF) 250 MCG/5ML IJ SOLN
INTRAMUSCULAR | Status: AC
  Filled 2023-06-25: qty 5

## 2023-06-25 MED ORDER — LACTATED RINGERS IV SOLN: INTRAVENOUS | Status: DC

## 2023-06-25 MED ORDER — OXYCODONE HCL 5 MG/5ML PO SOLN: 5.0000 mg | Freq: Once | ORAL | Status: DC | PRN

## 2023-06-25 MED ORDER — CEFAZOLIN SODIUM-DEXTROSE 2-3 GM-%(50ML) IV SOLR
INTRAVENOUS | Status: DC | PRN
  Administered 2023-06-25: 2 g via INTRAVENOUS

## 2023-06-25 MED ORDER — HYDROMORPHONE HCL 1 MG/ML IJ SOLN: 0.2500 mg | INTRAMUSCULAR | Status: DC | PRN

## 2023-06-25 MED ORDER — ONDANSETRON HCL 4 MG/2ML IJ SOLN
INTRAMUSCULAR | Status: AC
  Filled 2023-06-25: qty 2

## 2023-06-25 MED ORDER — BUPIVACAINE-EPINEPHRINE 0.25% -1:200000 IJ SOLN
INTRAMUSCULAR | Status: DC | PRN
  Administered 2023-06-25: 7 mL

## 2023-06-25 MED ORDER — PROPOFOL 10 MG/ML IV BOLUS
INTRAVENOUS | Status: AC
  Filled 2023-06-25: qty 20

## 2023-06-25 MED ORDER — ROCURONIUM BROMIDE 10 MG/ML (PF) SYRINGE
PREFILLED_SYRINGE | INTRAVENOUS | Status: AC
  Filled 2023-06-25: qty 10

## 2023-06-25 MED ORDER — FENTANYL CITRATE (PF) 250 MCG/5ML IJ SOLN
INTRAMUSCULAR | Status: DC | PRN
  Administered 2023-06-25 (×5): 50 ug via INTRAVENOUS

## 2023-06-25 SURGICAL SUPPLY — 35 items
APPLIER CLIP 5 13 M/L LIGAMAX5 (MISCELLANEOUS) ×1 IMPLANT
CANISTER SUCT 3000ML PPV (MISCELLANEOUS) ×1 IMPLANT
CHLORAPREP W/TINT 26 (MISCELLANEOUS) ×1 IMPLANT
CLIP APPLIE 5 13 M/L LIGAMAX5 (MISCELLANEOUS) ×1 IMPLANT
COVER MAYO STAND STRL (DRAPES) IMPLANT
COVER SURGICAL LIGHT HANDLE (MISCELLANEOUS) ×1 IMPLANT
DERMABOND ADVANCED .7 DNX12 (GAUZE/BANDAGES/DRESSINGS) ×1 IMPLANT
ELECT REM PT RETURN 9FT ADLT (ELECTROSURGICAL) ×1 IMPLANT
ELECTRODE REM PT RTRN 9FT ADLT (ELECTROSURGICAL) ×1 IMPLANT
ENDOLOOP SUT PDS II 0 18 (SUTURE) IMPLANT
GLOVE BIO SURGEON STRL SZ7 (GLOVE) ×1 IMPLANT
GLOVE BIOGEL PI IND STRL 7.5 (GLOVE) ×1 IMPLANT
GOWN STRL REUS W/ TWL LRG LVL3 (GOWN DISPOSABLE) ×3 IMPLANT
GRASPER SUT TROCAR 14GX15 (MISCELLANEOUS) ×1 IMPLANT
IRRIG SUCT STRYKERFLOW 2 WTIP (MISCELLANEOUS) ×1 IMPLANT
IRRIGATION SUCT STRKRFLW 2 WTP (MISCELLANEOUS) ×1 IMPLANT
KIT BASIN OR (CUSTOM PROCEDURE TRAY) ×1 IMPLANT
KIT TURNOVER KIT B (KITS) ×1 IMPLANT
NS IRRIG 1000ML POUR BTL (IV SOLUTION) ×1 IMPLANT
PENCIL SMOKE EVACUATOR (MISCELLANEOUS) IMPLANT
POUCH RETRIEVAL ECOSAC 10 (ENDOMECHANICALS) ×1 IMPLANT
SCISSORS LAP 5X35 DISP (ENDOMECHANICALS) ×1 IMPLANT
SET TUBE SMOKE EVAC HIGH FLOW (TUBING) ×1 IMPLANT
SLEEVE Z-THREAD 5X100MM (TROCAR) ×2 IMPLANT
SPECIMEN JAR SMALL (MISCELLANEOUS) ×1 IMPLANT
SUT ETHIBOND 0 MO6 C/R (SUTURE) IMPLANT
SUT MNCRL AB 4-0 PS2 18 (SUTURE) ×1 IMPLANT
SUT VIC AB 3-0 SH 27XBRD (SUTURE) IMPLANT
SUT VICRYL 0 UR6 27IN ABS (SUTURE) ×1 IMPLANT
TOWEL GREEN STERILE (TOWEL DISPOSABLE) ×1 IMPLANT
TRAY LAPAROSCOPIC MC (CUSTOM PROCEDURE TRAY) ×1 IMPLANT
TROCAR BALLN 12MMX100 BLUNT (TROCAR) ×1 IMPLANT
TROCAR Z-THREAD OPTICAL 5X100M (TROCAR) ×1 IMPLANT
WARMER LAPAROSCOPE (MISCELLANEOUS) ×1 IMPLANT
WATER STERILE IRR 1000ML POUR (IV SOLUTION) ×1 IMPLANT

## 2023-06-25 NOTE — Anesthesia Procedure Notes (Signed)
 Procedure Name: Intubation Date/Time: 06/25/2023 2:24 PM  Performed by: Sudie Grumbling, CRNAPre-anesthesia Checklist: Patient identified, Emergency Drugs available, Suction available and Patient being monitored Patient Re-evaluated:Patient Re-evaluated prior to induction Oxygen Delivery Method: Circle system utilized Preoxygenation: Pre-oxygenation with 100% oxygen Induction Type: IV induction Ventilation: Mask ventilation without difficulty Laryngoscope Size: Miller and 2 Grade View: Grade I Tube type: Oral Tube size: 7.0 mm Number of attempts: 1 Airway Equipment and Method: Stylet and Oral airway Placement Confirmation: ETT inserted through vocal cords under direct vision, positive ETCO2 and breath sounds checked- equal and bilateral Secured at: 22 cm Tube secured with: Tape Dental Injury: Teeth and Oropharynx as per pre-operative assessment

## 2023-06-25 NOTE — Op Note (Addendum)
   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 Harden Mo Assistant: Leary Roca, PA-C Estimated blood loss: Less than 50 cc Specimens: Gallbladder and contents to pathology Complications: None Drains: None Anesthesia: General Sponge needle count correct at completion Disposition to recovery stable addition   Indications: 78 yof with prior history of what sounds like rectus resection for free flap for tib/fib fracture remotely.She has had some ab pain in epigastrium for past six months that has been intermittent. She has known she has had gallstones. Since Thursday has noted more pain especially with eating. She later presented and was found to have a Tb of 2.8 with ct scan showing biliary ductal dilatation (not new). She then had mrcp and this showed the chronic biliary ductal dilatation as well as small cbd stones and cholelithiasis. Her tb was better and pain free. She is getting ercp tomorrow so we discussed lap chole today   Procedure: After informed consent was obtained she was taken to the operating room.  She was already on antibiotics.  She was given Ancef immediately preoperatively.  She had SCDs in place.  She was placed under general anesthesia without complication.  She was prepped and draped in a standard sterile surgical fashion.  Surgical timeout was then performed.   She had an umbilical hernia.  I made a curvilinear incision below the umbilicus and dissected out the stalk. The hernia was less than 1 cm.  I placed a trocar directly through this.   This was done without injury. I then inserted a 5 mm epigastric trocar and a 5 mm right sided trocar. I had to lyse adhesions to the abdominal wall from the omentum sharply then placed the last trocar. I looked several times to ensure no injury and there was none.   The gallbladder was then retracted cephalad and lateral.  She had evidence of chronic cholecystitis.  I was able to dissect in get a critical view of safety.  I clearly saw both the artery and duct and there was a wide window behind that.  I also used the ICG dye to identify the common duct and cystic duct and this confirmed my anatomy.  It became clear the gallbladder was right on the common duct. I then took the gallbladder completely off the liver and was left with it attached.  I used the ICG dye again to confirm I was up on the gallbladder.  I did not think I could safely do a cholangiogram due to Mirizzis and the inflammation. I clipped the artery and a vein next to it and divided it. I then placed endoloops around the base of the gallbladder off the ICG identified common duct. I divided this. I did place a clip on the proximal duct at the endoloop. There was another small arterial branch that I clipped as well. This was placed in retrieval bag and removed from the umbilical incision.  Hemostasis was observed.    I then desufflated the abdomen. I primarily closed the umbilical hernia with 0 ethibond suture.I blew the abdomen back up again and the repair was airtight without tension.    I then remove the remaining trocars and desufflated the abdomen.  I tacked the umbilicus down with 3-0 vicryl I then closed with 3-0 vicryl.The skin was closed with 4-0 Monocryl and glue.  She tolerated this well was extubated and transferred recovery stable.

## 2023-06-25 NOTE — Plan of Care (Signed)

## 2023-06-25 NOTE — Progress Notes (Signed)
 PROGRESS NOTE    [name]  ZOX:[id number] DOB: 11-May-1940 DOA: 06/24/2023 PCP: Benita Stabile, MD    Brief Narrative:  83 year old with history of hypertension, hyperlipidemia, known cholelithiasis, anxiety depression GERD presented with acute on chronic right upper quadrant abdominal pain.  She has gallbladder attack as per her description for many many years.  In the emergency room she was hemodynamically stable.  AST/ALT 335, 590.  Total bilirubin 2.8.  Lipase 47.  CT scan abdomen pelvis with cholelithiasis without evidence of acute cholecystitis, bile duct was dilated.  She was admitted to the hospital due to ongoing symptoms.  Followed by GI and surgery.  Subjective: Patient seen in the morning rounds.  She had some nausea but denied any complaints.  Her daughter-in-law was at the bedside.  Discussed with her along with surgeon and patient is taken to lap chole today. Assessment & Plan:   Symptomatic cholelithiasis with suspected choledocholithiasis: Currently hemodynamically stable.  LFTs mildly elevated.  On adequate IV fluids, pain medications. MRCP was positive for biliary duct dilatation with suspected stone.  ERCP scheduled for tomorrow. Lap chole today with intraoperative cholangiogram.  Hyponatremia, hypokalemia: Replaced.  On isotonic fluid with improvement.  Chronic medical issues including Essential hypertension, blood pressure stable Hypothyroidism, stable on levothyroxine Hyperlipidemia, statin on hold GERD, on PPI Smoker, counseled.  Declines nicotine patch.   DVT prophylaxis: Place and maintain sequential compression device Start: 06/24/23 1652   Code Status: Full code Family Communication: Daughter-in-law at the bedside Disposition Plan: Status is: Inpatient Remains inpatient appropriate because: Inpatient procedure planned     Consultants:  Gastroenterology Surgery  Procedures:  Planned  Antimicrobials:   Perioperative     Objective: Vitals:   06/24/23 1938 06/25/23 0415 06/25/23 0830 06/25/23 1235  BP: (!) 136/57 137/64 (!) 142/55 (!) 147/63  Pulse: 87 73 75 66  Resp: 18 17 17 18   Temp: 98.5 F (36.9 C) 98.8 F (37.1 C) 98.6 F (37 C) 97.8 F (36.6 C)  TempSrc: Oral Oral Oral Oral  SpO2: 95% 94% 94% 91%  Weight:      Height:        Intake/Output Summary (Last 24 hours) at 06/25/2023 1413 Last data filed at 06/25/2023 0900 Gross per 24 hour  Intake 940.76 ml  Output --  Net 940.76 ml   Filed Weights   06/24/23 0955  Weight: 81.6 kg    Examination:  General exam: Appears calm and comfortable  Respiratory system: Clear to auscultation. Respiratory effort normal. Cardiovascular system: S1 & S2 heard, RRR. Marland Kitchen Gastrointestinal system: Soft.  Nontender.  Bowel sound present. Central nervous system: Alert and oriented. No focal neurological deficits. Extremities: Symmetric 5 x 5 power. Skin: No rashes, lesions or ulcers Psychiatry: Judgement and insight appear normal. Mood & affect appropriate.     Data Reviewed: I have personally reviewed following labs and imaging studies  CBC: Recent Labs  Lab 06/24/23 1003 06/25/23 0622  WBC 10.1 7.6  NEUTROABS 7.0 4.6  HGB 12.9 11.6*  HCT 37.9 34.4*  MCV 89.4 88.0  PLT 247 205   Basic Metabolic Panel: Recent Labs  Lab 06/24/23 1003 06/25/23 0622  NA 131* 132*  K 3.4* 3.4*  CL 94* 98  CO2 27 24  GLUCOSE 108* 96  BUN 18 12  CREATININE 0.91 0.64  CALCIUM 9.4 9.0   GFR: Estimated Creatinine Clearance: 56.8 mL/min (by C-G formula based on SCr of 0.64 mg/dL). Liver Function Tests: Recent Labs  Lab 06/24/23 1003 06/25/23  0622  AST 335* 150*  ALT 590* 354*  ALKPHOS 230* 175*  BILITOT 2.8* 1.4*  PROT 6.8 5.9*  ALBUMIN 3.4* 2.9*   Recent Labs  Lab 06/24/23 1003  LIPASE 47   No results for input(s): "AMMONIA" in the last 168 hours. Coagulation Profile: Recent Labs  Lab 06/25/23 0622  INR 0.9    Cardiac Enzymes: No results for input(s): "CKTOTAL", "CKMB", "CKMBINDEX", "TROPONINI" in the last 168 hours. BNP (last 3 results) No results for input(s): "PROBNP" in the last 8760 hours. HbA1C: No results for input(s): "HGBA1C" in the last 72 hours. CBG: No results for input(s): "GLUCAP" in the last 168 hours. Lipid Profile: No results for input(s): "CHOL", "HDL", "LDLCALC", "TRIG", "CHOLHDL", "LDLDIRECT" in the last 72 hours. Thyroid Function Tests: No results for input(s): "TSH", "T4TOTAL", "FREET4", "T3FREE", "THYROIDAB" in the last 72 hours. Anemia Panel: No results for input(s): "VITAMINB12", "FOLATE", "FERRITIN", "TIBC", "IRON", "RETICCTPCT" in the last 72 hours. Sepsis Labs: No results for input(s): "PROCALCITON", "LATICACIDVEN" in the last 168 hours.  Recent Results (from the past 240 hours)  Surgical pcr screen     Status: None   Collection Time: 06/25/23  7:11 AM   Specimen: Nasal Mucosa; Nasal Swab  Result Value Ref Range Status   MRSA, PCR NEGATIVE NEGATIVE Final   Staphylococcus aureus NEGATIVE NEGATIVE Final    Comment: (NOTE) The Xpert SA Assay (FDA approved for NASAL specimens in patients 29 years of age and older), is one component of a comprehensive surveillance program. It is not intended to diagnose infection nor to guide or monitor treatment. Performed at [facility], [address], [place], [place] [address]          Radiology Studies: MR ABDOMEN MRCP W WO CONTAST Result Date: 06/24/2023 CLINICAL DATA:  Cholelithiasis.  Elevated liver function studies. EXAM: MRI ABDOMEN WITHOUT AND WITH CONTRAST (INCLUDING MRCP) TECHNIQUE: Multiplanar multisequence MR imaging of the abdomen was performed both before and after the administration of intravenous contrast. Heavily T2-weighted images of the biliary and pancreatic ducts were obtained, and three-dimensional MRCP images were rendered by post processing. CONTRAST:  8mL GADAVIST GADOBUTROL 1 MMOL/ML IV  SOLN COMPARISON:  CT scan 06/24/2023 and PET-CT 09/01/2021 FINDINGS: Lower chest: The heart is within normal limits in size. There is a moderate-sized pericardial effusion. The lung bases are grossly clear. No pulmonary lesions or pleural effusion. Hepatobiliary: There is a vague area of T1 and T2 signal abnormality in segment 7 of the liver measuring approximately 14 mm in size. This demonstrates arterial phase enhancement except for a tiny small central focus of non enhancement. Minimal residual enhancement around the small cystic area but no persistent enhancement otherwise. Indeterminate finding. No discrete mass is identified. Possibly a biliary hamartoma. Abscess is unlikely. Difficult to see this at all on the CT scan except for the small cystic area. There is chronic intra and extrahepatic biliary dilatation. The common bile duct does taper in the head of the pancreas to the ampulla. Small common bile duct stones are noted. Multiple small gallstones are noted. Pancreas: No mass, inflammation or ductal dilatation. There is a 5 mm cyst noted in the pancreatic body closely associated with the main pancreatic duct. This could be an IPMN or a benign postinflammatory cyst. Spleen:  Within normal limits in size and appearance. Adrenals/Urinary Tract: Small right adrenal gland adenoma noted. This shows loss of signal intensity on the out of phase T1 weighted gradient echo sequence. No change since 2023. No  renal lesions are identified. Small nonenhancing cysts do not require any further imaging evaluation or follow-up. Stomach/Bowel: Visualized portions within the abdomen are unremarkable. Vascular/Lymphatic: Aortic atherosclerosis but no aneurysm or dissection. The branch vessels are patent. The major venous structures are patent. No abdominal lymphadenopathy. Other: No ascites. Stable periumbilical abdominal wall hernia containing fat. Musculoskeletal: No significant bony findings.  Lumbar scoliosis. IMPRESSION:  1. Chronic underlying intra and extrahepatic biliary dilatation. Small common bile duct stones are noted and could be a superimposed obstructive process accounting for the patient's elevated liver function studies. 2. Cholelithiasis. 3. 14 mm vague area of T1 and T2 signal abnormality in segment 7 of the liver. This demonstrates arterial phase enhancement except for a tiny small central focus of non enhancement. No discrete mass is identified. Possibly a biliary hamartoma. Abscess is unlikely. Recommend follow-up MRI in 6 months to reassess. 4. 5 mm cyst in the pancreatic body closely associated with the main pancreatic duct. This could be an IPMN or a benign postinflammatory cyst. Recommend attention on follow-up study. 5. Moderate-sized pericardial effusion. 6. Stable small right adrenal gland adenoma. 7. Stable periumbilical abdominal wall hernia containing fat. Electronically Signed   By: Rudie Meyer M.D.   On: 06/24/2023 19:47   MR 3D Recon At Scanner Result Date: 06/24/2023 CLINICAL DATA:  Cholelithiasis.  Elevated liver function studies. EXAM: MRI ABDOMEN WITHOUT AND WITH CONTRAST (INCLUDING MRCP) TECHNIQUE: Multiplanar multisequence MR imaging of the abdomen was performed both before and after the administration of intravenous contrast. Heavily T2-weighted images of the biliary and pancreatic ducts were obtained, and three-dimensional MRCP images were rendered by post processing. CONTRAST:  8mL GADAVIST GADOBUTROL 1 MMOL/ML IV SOLN COMPARISON:  CT scan 06/24/2023 and PET-CT 09/01/2021 FINDINGS: Lower chest: The heart is within normal limits in size. There is a moderate-sized pericardial effusion. The lung bases are grossly clear. No pulmonary lesions or pleural effusion. Hepatobiliary: There is a vague area of T1 and T2 signal abnormality in segment 7 of the liver measuring approximately 14 mm in size. This demonstrates arterial phase enhancement except for a tiny small central focus of non enhancement.  Minimal residual enhancement around the small cystic area but no persistent enhancement otherwise. Indeterminate finding. No discrete mass is identified. Possibly a biliary hamartoma. Abscess is unlikely. Difficult to see this at all on the CT scan except for the small cystic area. There is chronic intra and extrahepatic biliary dilatation. The common bile duct does taper in the head of the pancreas to the ampulla. Small common bile duct stones are noted. Multiple small gallstones are noted. Pancreas: No mass, inflammation or ductal dilatation. There is a 5 mm cyst noted in the pancreatic body closely associated with the main pancreatic duct. This could be an IPMN or a benign postinflammatory cyst. Spleen:  Within normal limits in size and appearance. Adrenals/Urinary Tract: Small right adrenal gland adenoma noted. This shows loss of signal intensity on the out of phase T1 weighted gradient echo sequence. No change since 2023. No renal lesions are identified. Small nonenhancing cysts do not require any further imaging evaluation or follow-up. Stomach/Bowel: Visualized portions within the abdomen are unremarkable. Vascular/Lymphatic: Aortic atherosclerosis but no aneurysm or dissection. The branch vessels are patent. The major venous structures are patent. No abdominal lymphadenopathy. Other: No ascites. Stable periumbilical abdominal wall hernia containing fat. Musculoskeletal: No significant bony findings.  Lumbar scoliosis. IMPRESSION: 1. Chronic underlying intra and extrahepatic biliary dilatation. Small common bile duct stones are noted and could be  a superimposed obstructive process accounting for the patient's elevated liver function studies. 2. Cholelithiasis. 3. 14 mm vague area of T1 and T2 signal abnormality in segment 7 of the liver. This demonstrates arterial phase enhancement except for a tiny small central focus of non enhancement. No discrete mass is identified. Possibly a biliary hamartoma. Abscess  is unlikely. Recommend follow-up MRI in 6 months to reassess. 4. 5 mm cyst in the pancreatic body closely associated with the main pancreatic duct. This could be an IPMN or a benign postinflammatory cyst. Recommend attention on follow-up study. 5. Moderate-sized pericardial effusion. 6. Stable small right adrenal gland adenoma. 7. Stable periumbilical abdominal wall hernia containing fat. Electronically Signed   By: Rudie Meyer M.D.   On: 06/24/2023 19:47   CT ABDOMEN PELVIS W CONTRAST Result Date: 06/24/2023 CLINICAL DATA:  Right and left lower quadrant abdominal pain EXAM: CT ABDOMEN AND PELVIS WITH CONTRAST TECHNIQUE: Multidetector CT imaging of the abdomen and pelvis was performed using the standard protocol following bolus administration of intravenous contrast. RADIATION DOSE REDUCTION: This exam was performed according to the departmental dose-optimization program which includes automated exposure control, adjustment of the mA and/or kV according to patient size and/or use of iterative reconstruction technique. CONTRAST:  75mL OMNIPAQUE IOHEXOL 350 MG/ML SOLN COMPARISON:  09/02/2011 FINDINGS: Lower chest: No acute pleural or parenchymal lung disease. Trace pericardial fluid unchanged. Hepatobiliary: Multiple calcified gallstones are noted. No evidence of gallbladder wall thickening or pericholecystic fluid to suggest cholecystitis. Mild biliary dilation, with common bile duct measuring 10 mm, likely appropriate for age. No evidence of choledocholithiasis. The liver is unremarkable. Pancreas: Unremarkable. No pancreatic ductal dilatation or surrounding inflammatory changes. Spleen: Normal in size without focal abnormality. Adrenals/Urinary Tract: Stable bilateral adrenal thickening consistent with adenomas or hyperplasia. Bilateral renal cortical thinning. No urinary tract calculi or obstructive uropathy. Bladder is moderately distended without focal abnormality. Stomach/Bowel: No bowel obstruction or  ileus. Normal appendix right lower quadrant. Moderate retained stool throughout the colon. No bowel wall thickening or inflammatory change. Vascular/Lymphatic: Aortic atherosclerosis. No enlarged abdominal or pelvic lymph nodes. Reproductive: Status post hysterectomy. No adnexal masses. Other: No free fluid or free intraperitoneal gas. Fat containing umbilical hernia. Musculoskeletal: No acute or destructive bony abnormalities. Reconstructed images demonstrate no additional findings. IMPRESSION: 1. Cholelithiasis without evidence of acute cholecystitis. 2. Biliary duct dilation, likely appropriate for age. No evidence of choledocholithiasis. 3. Moderate fecal retention consistent with constipation. No bowel obstruction or ileus. 4. Stable fat containing umbilical hernia. 5.  Aortic Atherosclerosis (ICD10-I70.0). Electronically Signed   By: Sharlet Salina M.D.   On: 06/24/2023 14:28        Scheduled Meds:  [MAR Hold] clonazePAM  0.5 mg Oral QHS   [MAR Hold] levothyroxine  137 mcg Oral QAC breakfast   [MAR Hold] loratadine  10 mg Oral Daily   [MAR Hold] pantoprazole  40 mg Oral QAC breakfast   [MAR Hold] sodium chloride flush  3 mL Intravenous Q12H   Continuous Infusions:  sodium chloride Stopped (06/25/23 0157)   lactated ringers 10 mL/hr at 06/25/23 1245     LOS: 1 day    Time spent: 40 minutes    Dorcas Carrow, MD [facility]

## 2023-06-25 NOTE — Anesthesia Preprocedure Evaluation (Addendum)
 Anesthesia Evaluation  Patient identified by MRN, date of birth, ID band Patient awake    Reviewed: Allergy & Precautions, NPO status , Patient's Chart, lab work & pertinent test results, reviewed documented beta blocker date and time   Airway Mallampati: I       Dental no notable dental hx. (+) Caps, Dental Advisory Given, Teeth Intact,    Pulmonary Current Smoker and Patient abstained from smoking.   Pulmonary exam normal breath sounds clear to auscultation       Cardiovascular hypertension, Pt. on medications Normal cardiovascular exam Rhythm:Regular Rate:Normal     Neuro/Psych  PSYCHIATRIC DISORDERS Anxiety Depression    negative neurological ROS     GI/Hepatic Neg liver ROS,GERD  Medicated,,Cholelithiasis with acute cholecystitis Choledocholithiasis   Endo/Other  Hypothyroidism  Hyperlipidemia   Renal/GU negative Renal ROS  negative genitourinary   Musculoskeletal  (+) Arthritis , Osteoarthritis,    Abdominal  (+) + obese  Peds  Hematology negative hematology ROS (+)   Anesthesia Other Findings   Reproductive/Obstetrics                             Anesthesia Physical Anesthesia Plan  ASA: 2  Anesthesia Plan: General   Post-op Pain Management: Dilaudid IV and Tylenol PO (pre-op)*   Induction: Intravenous and Cricoid pressure planned  PONV Risk Score and Plan: 4 or greater and Treatment may vary due to age or medical condition  Airway Management Planned: Oral ETT  Additional Equipment: None  Intra-op Plan:   Post-operative Plan: Extubation in OR  Informed Consent: I have reviewed the patients History and Physical, chart, labs and discussed the procedure including the risks, benefits and alternatives for the proposed anesthesia with the patient or authorized representative who has indicated his/her understanding and acceptance.     Dental advisory given  Plan  Discussed with: CRNA and Anesthesiologist  Anesthesia Plan Comments:        Anesthesia Quick Evaluation

## 2023-06-25 NOTE — Plan of Care (Signed)

## 2023-06-25 NOTE — Plan of Care (Signed)

## 2023-06-25 NOTE — Consult Note (Addendum)
 Reason for Consult:ab pain Referring Physician: Dr Ginette Otto is an 83 y.o. female.  HPI: 9 yof with prior history of what sounds like rectus resection for free flap for tib/fib fracture remotely.  She also has had breast cancer, gerd with a  prior dilation and hypothyroidism.  She has had some ab pain in epigastrium for past six months that has been intermittent.  She has known she has had gallstones.  Since Thursday has noted more pain especially with eating. She called ems and did not go to er at that time.  She later presented and was found to have a Tb of 2.8 with ct scan showing biliary ductal dilatation (not new). She then had mrcp and this showed the chronic biliary ductal dilatation as well as small cbd stones and cholelithiasis. When I see her she is pain free.  Tb now 1.4.  I was asked to see her for gb   Past Medical History:  Diagnosis Date   Abnormal heart rhythms    Allergic rhinitis    Anxiety    Arthritis    Bronchitis    Chest wall pain    ANTERIOR   Depression    Dysuria    Eustachian tube dysfunction    Gallstones    Gallstones    GERD (gastroesophageal reflux disease)    Hyperlipidemia    Hypertension    Hypothyroidism    Hypoxemia    Incontinence    Liver function study, abnormal    Malaise and fatigue    Microscopic hematuria    Osteopenia    Personal history of colonic polyps    Primary breast malignancy (HCC)    Snoring    Tobacco abuse    Ulcer     Past Surgical History:  Procedure Laterality Date   BILATERAL MASTECTOMY     COLONOSCOPY  4/07   ZHY:[id number] internal hemorrhoids otherwise normal   COLONOSCOPY  05/25/2010   NGE:[id number] hemorrhoids, otherwise normal rectum and colon.(POOR PREP). Due for surveillance 2017.    COLONOSCOPY  04/22/2002     RMR: Internal hemorrhoids, otherwise normal rectum  Polyps in the left colon resected with snare   COLONOSCOPY N/A 11/30/2017   Procedure: COLONOSCOPY;  Surgeon: Corbin Ade, MD;   [facility];  Service: Endoscopy;  Laterality: N/A;  12:00pm   ESOPHAGOGASTRODUODENOSCOPY  05/25/2010   RMR:EGD distal esophageal erosions consistent with mild erosive reflux esophagitis, otherwise unremarkable esophagus status post passage of a 56-French Maloney dilator/Small hiatal hernia/Gastric ulcer with negative H.pylori. Overdue for surveillance.    ESOPHAGOGASTRODUODENOSCOPY N/A 03/10/2013   Dr. Jena Gauss: Schatzki's ring s/p dilation, erosive reflux esophagitis, hiatal hernia, gastric erosions, negative h.pylori   ESOPHAGOGASTRODUODENOSCOPY N/A 11/30/2017   Procedure: ESOPHAGOGASTRODUODENOSCOPY (EGD);  Surgeon: Corbin Ade, MD;  [facility];  Service: Endoscopy;  Laterality: N/A;   LEFT LEG SURGERY     MULTIPLE DUE TO FX   LEFT RTC TEAR     MALONEY DILATION N/A 03/10/2013   Procedure: [name] DILATION;  Surgeon: Corbin Ade, MD;  [facility];  Service: Endoscopy;  Laterality: N/A;   MALONEY DILATION N/A 11/30/2017   Procedure: [name] DILATION;  Surgeon: Corbin Ade, MD;  [facility];  Service: Endoscopy;  Laterality: N/A;   SAVORY DILATION N/A 03/10/2013   Procedure: SAVORY DILATION;  Surgeon: Corbin Ade, MD;  [facility];  Service: Endoscopy;  Laterality: N/A;   TONSILECTOMY, ADENOIDECTOMY, BILATERAL MYRINGOTOMY AND  TUBES     TONSILLECTOMY     VESICOVAGINAL FISTULA CLOSURE W/ TAH     excessive bleeding-benign    Family History  Problem Relation Age of Onset   Cancer Mother    Heart disease Father    Heart disease Brother    Cancer Sister    Aneurysm Sister    Colon cancer Neg Hx     Social History:  reports that she has been smoking cigarettes. She has a 6.3 pack-year smoking history. She has never used smokeless tobacco. She reports that she does not drink alcohol and does not use drugs.  Allergies:  Allergies  Allergen Reactions   Sulfa Antibiotics Anaphylaxis    Medications: I have reviewed the  patient's current medications.  Results for orders placed or performed during the hospital encounter of 06/24/23 (from the past 48 hours)  Comprehensive metabolic panel     Status: Abnormal   Collection Time: 06/24/23 10:03 AM  Result Value Ref Range   Sodium 131 (L) 135 - 145 mmol/L   Potassium 3.4 (L) 3.5 - 5.1 mmol/L   Chloride 94 (L) 98 - 111 mmol/L   CO2 27 22 - 32 mmol/L   Glucose, Bld 108 (H) 70 - 99 mg/dL    Comment: Glucose reference range applies only to samples taken after fasting for at least 8 hours.   BUN 18 8 - 23 mg/dL   Creatinine, Ser 0.98 0.44 - 1.00 mg/dL   Calcium 9.4 8.9 - 11.9 mg/dL   Total Protein 6.8 6.5 - 8.1 g/dL   Albumin 3.4 (L) 3.5 - 5.0 g/dL   AST 147 (H) 15 - 41 U/L   ALT 590 (H) 0 - 44 U/L   Alkaline Phosphatase 230 (H) 38 - 126 U/L   Total Bilirubin 2.8 (H) 0.0 - 1.2 mg/dL   GFR, Estimated >82 >95 mL/min    Comment: (NOTE) Calculated using the CKD-EPI Creatinine Equation (2021)    Anion gap 10 5 - 15    Comment: Performed at [facility], [address], [place], [place] [address]  CBC with Differential     Status: Abnormal   Collection Time: 06/24/23 10:03 AM  Result Value Ref Range   WBC 10.1 4.0 - 10.5 K/uL   RBC 4.24 3.87 - 5.11 MIL/uL   Hemoglobin 12.9 12.0 - 15.0 g/dL   HCT 86.5 78.4 - 69.6 %   MCV 89.4 80.0 - 100.0 fL   MCH 30.4 26.0 - 34.0 pg   MCHC 34.0 30.0 - 36.0 g/dL   RDW 29.5 28.4 - 13.2 %   Platelets 247 150 - 400 K/uL   nRBC 0.0 0.0 - 0.2 %   Neutrophils Relative % 69 %   Neutro Abs 7.0 1.7 - 7.7 K/uL   Lymphocytes Relative 15 %   Lymphs Abs 1.5 0.7 - 4.0 K/uL   Monocytes Relative 13 %   Monocytes Absolute 1.3 (H) 0.1 - 1.0 K/uL   Eosinophils Relative 2 %   Eosinophils Absolute 0.2 0.0 - 0.5 K/uL   Basophils Relative 1 %   Basophils Absolute 0.1 0.0 - 0.1 K/uL   Immature Granulocytes 0 %   Abs Immature Granulocytes 0.04 0.00 - 0.07 K/uL    Comment: Performed at [facility], [address],  [place], [place] [address]  Lipase, blood     Status: None   Collection Time: 06/24/23 10:03 AM  Result Value Ref Range   Lipase 47 11 - 51  U/L    Comment: Performed at [facility], [address], [place], [place] [address]  Troponin I (High Sensitivity)     Status: None   Collection Time: 06/24/23 10:03 AM  Result Value Ref Range   Troponin I (High Sensitivity) 7 <18 ng/L    Comment: (NOTE) Elevated high sensitivity troponin I (hsTnI) values and significant  changes across serial measurements may suggest ACS but many other  chronic and acute conditions are known to elevate hsTnI results.  Refer to the "Links" section for chest pain algorithms and additional  guidance. Performed at [facility], [address] [place], [place] [address]   Troponin I (High Sensitivity)     Status: None   Collection Time: 06/24/23 12:53 PM  Result Value Ref Range   Troponin I (High Sensitivity) 7 <18 ng/L    Comment: (NOTE) Elevated high sensitivity troponin I (hsTnI) values and significant  changes across serial measurements may suggest ACS but many other  chronic and acute conditions are known to elevate hsTnI results.  Refer to the "Links" section for chest pain algorithms and additional  guidance. Performed at [facility], [address], [place], [place] [address]   Urinalysis, w/ Reflex to Culture (Infection Suspected) -Urine, Clean Catch     Status: None   Collection Time: 06/24/23  2:24 PM  Result Value Ref Range   Specimen Source URINE, CLEAN CATCH    Color, Urine YELLOW YELLOW   APPearance CLEAR CLEAR   Specific Gravity, Urine 1.027 1.005 - 1.030   pH 5.0 5.0 - 8.0   Glucose, UA NEGATIVE NEGATIVE mg/dL   Hgb urine dipstick NEGATIVE NEGATIVE   Bilirubin Urine NEGATIVE NEGATIVE   Ketones, ur NEGATIVE NEGATIVE mg/dL   Protein, ur NEGATIVE NEGATIVE mg/dL   Nitrite NEGATIVE NEGATIVE   Leukocytes,Ua NEGATIVE NEGATIVE   RBC / HPF 0-5 0 - 5 RBC/hpf   WBC, UA 0-5 0 - 5  WBC/hpf    Comment:        Reflex urine culture not performed if WBC <=10, OR if Squamous epithelial cells >5. If Squamous epithelial cells >5 suggest recollection.    Bacteria, UA NONE SEEN NONE SEEN   Squamous Epithelial / HPF 0-5 0 - 5 /HPF    Comment: Performed at [facility], [address] [place], [place] [address]  Comprehensive metabolic panel     Status: Abnormal   Collection Time: 06/25/23  6:22 AM  Result Value Ref Range   Sodium 132 (L) 135 - 145 mmol/L   Potassium 3.4 (L) 3.5 - 5.1 mmol/L   Chloride 98 98 - 111 mmol/L   CO2 24 22 - 32 mmol/L   Glucose, Bld 96 70 - 99 mg/dL    Comment: Glucose reference range applies only to samples taken after fasting for at least 8 hours.   BUN 12 8 - 23 mg/dL   Creatinine, Ser 0.93 0.44 - 1.00 mg/dL   Calcium 9.0 8.9 - 23.5 mg/dL   Total Protein 5.9 (L) 6.5 - 8.1 g/dL   Albumin 2.9 (L) 3.5 - 5.0 g/dL   AST 573 (H) 15 - 41 U/L   ALT 354 (H) 0 - 44 U/L   Alkaline Phosphatase 175 (H) 38 - 126 U/L   Total Bilirubin 1.4 (H) 0.0 - 1.2 mg/dL   GFR, Estimated >22 >02 mL/min    Comment: (NOTE) Calculated using the CKD-EPI Creatinine Equation (2021)    Anion gap 10  5 - 15    Comment: Performed at [facility], [address], [place], [place] [address]  CBC with Differential/Platelet     Status: Abnormal   Collection Time: 06/25/23  6:22 AM  Result Value Ref Range   WBC 7.6 4.0 - 10.5 K/uL   RBC 3.91 3.87 - 5.11 MIL/uL   Hemoglobin 11.6 (L) 12.0 - 15.0 g/dL   HCT 59.5 (L) 63.8 - 75.6 %   MCV 88.0 80.0 - 100.0 fL   MCH 29.7 26.0 - 34.0 pg   MCHC 33.7 30.0 - 36.0 g/dL   RDW 43.3 29.5 - 18.8 %   Platelets 205 150 - 400 K/uL   nRBC 0.0 0.0 - 0.2 %   Neutrophils Relative % 60 %   Neutro Abs 4.6 1.7 - 7.7 K/uL   Lymphocytes Relative 19 %   Lymphs Abs 1.4 0.7 - 4.0 K/uL   Monocytes Relative 14 %   Monocytes Absolute 1.0 0.1 - 1.0 K/uL   Eosinophils Relative 6 %   Eosinophils Absolute 0.5 0.0 - 0.5 K/uL   Basophils  Relative 1 %   Basophils Absolute 0.1 0.0 - 0.1 K/uL   Immature Granulocytes 0 %   Abs Immature Granulocytes 0.03 0.00 - 0.07 K/uL    Comment: Performed at [facility], [address], [place], [place] [address]  Protime-INR     Status: None   Collection Time: 06/25/23  6:22 AM  Result Value Ref Range   Prothrombin Time 12.7 11.4 - 15.2 seconds   INR 0.9 0.8 - 1.2    Comment: (NOTE) INR goal varies based on device and disease states. Performed at [facility], [address] [place], [place] [address]   Surgical pcr screen     Status: None   Collection Time: 06/25/23  7:11 AM   Specimen: Nasal Mucosa; Nasal Swab  Result Value Ref Range   MRSA, PCR NEGATIVE NEGATIVE   Staphylococcus aureus NEGATIVE NEGATIVE    Comment: (NOTE) The Xpert SA Assay (FDA approved for NASAL specimens in patients 68 years of age and older), is one component of a comprehensive surveillance program. It is not intended to diagnose infection nor to guide or monitor treatment. Performed at [facility], [address] [place], [place] [address]     MR ABDOMEN MRCP W WO CONTAST Result Date: 06/24/2023 CLINICAL DATA:  Cholelithiasis.  Elevated liver function studies. EXAM: MRI ABDOMEN WITHOUT AND WITH CONTRAST (INCLUDING MRCP) TECHNIQUE: Multiplanar multisequence MR imaging of the abdomen was performed both before and after the administration of intravenous contrast. Heavily T2-weighted images of the biliary and pancreatic ducts were obtained, and three-dimensional MRCP images were rendered by post processing. CONTRAST:  8mL GADAVIST GADOBUTROL 1 MMOL/ML IV SOLN COMPARISON:  CT scan 06/24/2023 and PET-CT 09/01/2021 FINDINGS: Lower chest: The heart is within normal limits in size. There is a moderate-sized pericardial effusion. The lung bases are grossly clear. No pulmonary lesions or pleural effusion. Hepatobiliary: There is a vague area of T1 and T2 signal abnormality in segment 7 of the liver  measuring approximately 14 mm in size. This demonstrates arterial phase enhancement except for a tiny small central focus of non enhancement. Minimal residual enhancement around the small cystic area but no persistent enhancement otherwise. Indeterminate finding. No discrete mass is identified. Possibly a biliary hamartoma. Abscess is unlikely. Difficult to see this at all on the CT scan except for the small cystic area. There is chronic  intra and extrahepatic biliary dilatation. The common bile duct does taper in the head of the pancreas to the ampulla. Small common bile duct stones are noted. Multiple small gallstones are noted. Pancreas: No mass, inflammation or ductal dilatation. There is a 5 mm cyst noted in the pancreatic body closely associated with the main pancreatic duct. This could be an IPMN or a benign postinflammatory cyst. Spleen:  Within normal limits in size and appearance. Adrenals/Urinary Tract: Small right adrenal gland adenoma noted. This shows loss of signal intensity on the out of phase T1 weighted gradient echo sequence. No change since 2023. No renal lesions are identified. Small nonenhancing cysts do not require any further imaging evaluation or follow-up. Stomach/Bowel: Visualized portions within the abdomen are unremarkable. Vascular/Lymphatic: Aortic atherosclerosis but no aneurysm or dissection. The branch vessels are patent. The major venous structures are patent. No abdominal lymphadenopathy. Other: No ascites. Stable periumbilical abdominal wall hernia containing fat. Musculoskeletal: No significant bony findings.  Lumbar scoliosis. IMPRESSION: 1. Chronic underlying intra and extrahepatic biliary dilatation. Small common bile duct stones are noted and could be a superimposed obstructive process accounting for the patient's elevated liver function studies. 2. Cholelithiasis. 3. 14 mm vague area of T1 and T2 signal abnormality in segment 7 of the liver. This demonstrates arterial phase  enhancement except for a tiny small central focus of non enhancement. No discrete mass is identified. Possibly a biliary hamartoma. Abscess is unlikely. Recommend follow-up MRI in 6 months to reassess. 4. 5 mm cyst in the pancreatic body closely associated with the main pancreatic duct. This could be an IPMN or a benign postinflammatory cyst. Recommend attention on follow-up study. 5. Moderate-sized pericardial effusion. 6. Stable small right adrenal gland adenoma. 7. Stable periumbilical abdominal wall hernia containing fat. Electronically Signed   By: Rudie Meyer M.D.   On: 06/24/2023 19:47   MR 3D Recon At Scanner Result Date: 06/24/2023 CLINICAL DATA:  Cholelithiasis.  Elevated liver function studies. EXAM: MRI ABDOMEN WITHOUT AND WITH CONTRAST (INCLUDING MRCP) TECHNIQUE: Multiplanar multisequence MR imaging of the abdomen was performed both before and after the administration of intravenous contrast. Heavily T2-weighted images of the biliary and pancreatic ducts were obtained, and three-dimensional MRCP images were rendered by post processing. CONTRAST:  8mL GADAVIST GADOBUTROL 1 MMOL/ML IV SOLN COMPARISON:  CT scan 06/24/2023 and PET-CT 09/01/2021 FINDINGS: Lower chest: The heart is within normal limits in size. There is a moderate-sized pericardial effusion. The lung bases are grossly clear. No pulmonary lesions or pleural effusion. Hepatobiliary: There is a vague area of T1 and T2 signal abnormality in segment 7 of the liver measuring approximately 14 mm in size. This demonstrates arterial phase enhancement except for a tiny small central focus of non enhancement. Minimal residual enhancement around the small cystic area but no persistent enhancement otherwise. Indeterminate finding. No discrete mass is identified. Possibly a biliary hamartoma. Abscess is unlikely. Difficult to see this at all on the CT scan except for the small cystic area. There is chronic intra and extrahepatic biliary dilatation. The  common bile duct does taper in the head of the pancreas to the ampulla. Small common bile duct stones are noted. Multiple small gallstones are noted. Pancreas: No mass, inflammation or ductal dilatation. There is a 5 mm cyst noted in the pancreatic body closely associated with the main pancreatic duct. This could be an IPMN or a benign postinflammatory cyst. Spleen:  Within normal limits in size and appearance. Adrenals/Urinary Tract: Small right adrenal gland adenoma  noted. This shows loss of signal intensity on the out of phase T1 weighted gradient echo sequence. No change since 2023. No renal lesions are identified. Small nonenhancing cysts do not require any further imaging evaluation or follow-up. Stomach/Bowel: Visualized portions within the abdomen are unremarkable. Vascular/Lymphatic: Aortic atherosclerosis but no aneurysm or dissection. The branch vessels are patent. The major venous structures are patent. No abdominal lymphadenopathy. Other: No ascites. Stable periumbilical abdominal wall hernia containing fat. Musculoskeletal: No significant bony findings.  Lumbar scoliosis. IMPRESSION: 1. Chronic underlying intra and extrahepatic biliary dilatation. Small common bile duct stones are noted and could be a superimposed obstructive process accounting for the patient's elevated liver function studies. 2. Cholelithiasis. 3. 14 mm vague area of T1 and T2 signal abnormality in segment 7 of the liver. This demonstrates arterial phase enhancement except for a tiny small central focus of non enhancement. No discrete mass is identified. Possibly a biliary hamartoma. Abscess is unlikely. Recommend follow-up MRI in 6 months to reassess. 4. 5 mm cyst in the pancreatic body closely associated with the main pancreatic duct. This could be an IPMN or a benign postinflammatory cyst. Recommend attention on follow-up study. 5. Moderate-sized pericardial effusion. 6. Stable small right adrenal gland adenoma. 7. Stable  periumbilical abdominal wall hernia containing fat. Electronically Signed   By: Rudie Meyer M.D.   On: 06/24/2023 19:47   CT ABDOMEN PELVIS W CONTRAST Result Date: 06/24/2023 CLINICAL DATA:  Right and left lower quadrant abdominal pain EXAM: CT ABDOMEN AND PELVIS WITH CONTRAST TECHNIQUE: Multidetector CT imaging of the abdomen and pelvis was performed using the standard protocol following bolus administration of intravenous contrast. RADIATION DOSE REDUCTION: This exam was performed according to the departmental dose-optimization program which includes automated exposure control, adjustment of the mA and/or kV according to patient size and/or use of iterative reconstruction technique. CONTRAST:  75mL OMNIPAQUE IOHEXOL 350 MG/ML SOLN COMPARISON:  09/02/2011 FINDINGS: Lower chest: No acute pleural or parenchymal lung disease. Trace pericardial fluid unchanged. Hepatobiliary: Multiple calcified gallstones are noted. No evidence of gallbladder wall thickening or pericholecystic fluid to suggest cholecystitis. Mild biliary dilation, with common bile duct measuring 10 mm, likely appropriate for age. No evidence of choledocholithiasis. The liver is unremarkable. Pancreas: Unremarkable. No pancreatic ductal dilatation or surrounding inflammatory changes. Spleen: Normal in size without focal abnormality. Adrenals/Urinary Tract: Stable bilateral adrenal thickening consistent with adenomas or hyperplasia. Bilateral renal cortical thinning. No urinary tract calculi or obstructive uropathy. Bladder is moderately distended without focal abnormality. Stomach/Bowel: No bowel obstruction or ileus. Normal appendix right lower quadrant. Moderate retained stool throughout the colon. No bowel wall thickening or inflammatory change. Vascular/Lymphatic: Aortic atherosclerosis. No enlarged abdominal or pelvic lymph nodes. Reproductive: Status post hysterectomy. No adnexal masses. Other: No free fluid or free intraperitoneal gas. Fat  containing umbilical hernia. Musculoskeletal: No acute or destructive bony abnormalities. Reconstructed images demonstrate no additional findings. IMPRESSION: 1. Cholelithiasis without evidence of acute cholecystitis. 2. Biliary duct dilation, likely appropriate for age. No evidence of choledocholithiasis. 3. Moderate fecal retention consistent with constipation. No bowel obstruction or ileus. 4. Stable fat containing umbilical hernia. 5.  Aortic Atherosclerosis (ICD10-I70.0). Electronically Signed   By: Sharlet Salina M.D.   On: 06/24/2023 14:28    Review of Systems  Cardiovascular:  Positive for chest pain.  Gastrointestinal:  Positive for abdominal pain.  All other systems reviewed and are negative.  Blood pressure (!) 142/55, pulse 75, temperature 98.6 F (37 C), temperature source Oral, resp. rate 17, height 5'  3" (1.6 m), weight 81.6 kg, SpO2 94%. Physical Exam Vitals reviewed.  Constitutional:      Appearance: She is well-developed.  Eyes:     General: No scleral icterus.    Pupils: Pupils are equal, round, and reactive to light.  Cardiovascular:     Rate and Rhythm: Normal rate and regular rhythm.  Pulmonary:     Effort: Pulmonary effort is normal.     Breath sounds: No wheezing.  Abdominal:     General: There is no distension.     Palpations: Abdomen is soft.     Tenderness: There is no abdominal tenderness.     Hernia: A hernia is present. Hernia is present in the umbilical area.    Musculoskeletal:     Right lower leg: No edema.     Left lower leg: No edema.  Skin:    General: Skin is warm and dry.     Capillary Refill: Capillary refill takes less than 2 seconds.     Coloration: Skin is not jaundiced.  Neurological:     General: No focal deficit present.     Mental Status: She is alert.  Psychiatric:        Mood and Affect: Mood normal.        Behavior: Behavior normal.     Assessment/Plan: Cholelithiasis, symptomatic Choledocholithiasis -lfts better, no  pain, this is likely chronic given imaging -plan currently is for ERCP tomorrow -I think reasonable given findings to proceed with lap chole, IOC today -I discussed the procedure in detail.  We discussed the risks and benefits of a laparoscopic cholecystectomy and possible cholangiogram including, but not limited to bleeding, infection, injury to surrounding structures such as the intestine or liver, bile leak, retained gallstones, need to convert to an open procedure, prolonged diarrhea, blood clots such as  DVT, common bile duct injury, anesthesia risks, and possible need for additional procedures.  The likelihood of improvement in symptoms and return to the patient's normal status is good. We discussed the typical post-operative recovery course. -I will also fix UH primarily (this is not incisional and looks small on ct) and discussed recurrence  Emelia Loron 06/25/2023, 11:06 AM

## 2023-06-25 NOTE — Transfer of Care (Signed)
 Immediate Anesthesia Transfer of Care Note  Patient: [name]  Procedure(s) Performed: LAPAROSCOPIC CHOLECYSTECTOMY (Abdomen) INDOCYANINE GREEN FLUORESCENCE IMAGING (ICG) (Abdomen)  Patient Location: PACU  Anesthesia Type:General  Level of Consciousness: awake, alert , and oriented  Airway & Oxygen Therapy: Patient Spontanous Breathing and Patient connected to face mask oxygen  Post-op Assessment: Report given to RN and Post -op Vital signs reviewed and stable  Post vital signs: Reviewed and stable  Last Vitals:  Vitals Value Taken Time  BP 176/66 06/25/23 1602  Temp 36.8 C 06/25/23 1602  Pulse 90 06/25/23 1608  Resp 23 06/25/23 1608  SpO2 92 % 06/25/23 1608  Vitals shown include unfiled device data.  Last Pain:  Vitals:   06/25/23 1235  TempSrc: Oral  PainSc: 0-No pain         Complications: No notable events documented.

## 2023-06-26 ENCOUNTER — Inpatient Hospital Stay (HOSPITAL_COMMUNITY): Admitting: Certified Registered Nurse Anesthetist

## 2023-06-26 ENCOUNTER — Inpatient Hospital Stay (HOSPITAL_COMMUNITY)

## 2023-06-26 ENCOUNTER — Encounter (HOSPITAL_COMMUNITY)
Admission: EM | Disposition: A | Discharge status: Home / Self Care | Payer: Self-pay | Source: Home / Self Care | Attending: Internal Medicine

## 2023-06-26 ENCOUNTER — Encounter (HOSPITAL_COMMUNITY): Payer: Self-pay | Admitting: General Surgery

## 2023-06-26 DIAGNOSIS — E039 Hypothyroidism, unspecified: Secondary | ICD-10-CM | POA: Diagnosis not present

## 2023-06-26 DIAGNOSIS — K8045 Calculus of bile duct with chronic cholecystitis with obstruction: Secondary | ICD-10-CM | POA: Diagnosis not present

## 2023-06-26 DIAGNOSIS — K8050 Calculus of bile duct without cholangitis or cholecystitis without obstruction: Secondary | ICD-10-CM

## 2023-06-26 DIAGNOSIS — K838 Other specified diseases of biliary tract: Secondary | ICD-10-CM

## 2023-06-26 DIAGNOSIS — I10 Essential (primary) hypertension: Secondary | ICD-10-CM | POA: Diagnosis not present

## 2023-06-26 DIAGNOSIS — Z9049 Acquired absence of other specified parts of digestive tract: Secondary | ICD-10-CM

## 2023-06-26 HISTORY — PX: BILIARY DILATION: SHX6850

## 2023-06-26 HISTORY — PX: ERCP: SHX5425

## 2023-06-26 HISTORY — PX: STONE EXTRACTION WITH BASKET: SHX5318

## 2023-06-26 LAB — COMPREHENSIVE METABOLIC PANEL
ALT: 235 U/L — ABNORMAL HIGH (ref 0–44)
AST: 97 U/L — ABNORMAL HIGH (ref 15–41)
Albumin: 2.8 g/dL — ABNORMAL LOW (ref 3.5–5.0)
Alkaline Phosphatase: 140 U/L — ABNORMAL HIGH (ref 38–126)
Anion gap: 11 (ref 5–15)
BUN: 11 mg/dL (ref 8–23)
CO2: 24 mmol/L (ref 22–32)
Calcium: 9 mg/dL (ref 8.9–10.3)
Chloride: 99 mmol/L (ref 98–111)
Creatinine, Ser: 0.61 mg/dL (ref 0.44–1.00)
GFR, Estimated: >60 mL/min (ref >60)
Glucose, Bld: 120 mg/dL — ABNORMAL HIGH (ref 70–99)
Potassium: 3.8 mmol/L (ref 3.5–5.1)
Sodium: 134 mmol/L — ABNORMAL LOW (ref 135–145)
Total Bilirubin: 1 mg/dL (ref 0.0–1.2)
Total Protein: 5.7 g/dL — ABNORMAL LOW (ref 6.5–8.1)

## 2023-06-26 LAB — CBC
HCT: 33 % — ABNORMAL LOW (ref 36.0–46.0)
Hemoglobin: 11.4 g/dL — ABNORMAL LOW (ref 12.0–15.0)
MCH: 30.6 pg (ref 26.0–34.0)
MCHC: 34.5 g/dL (ref 30.0–36.0)
MCV: 88.5 fL (ref 80.0–100.0)
Platelets: 214 10*3/uL (ref 150–400)
RBC: 3.73 MIL/uL — ABNORMAL LOW (ref 3.87–5.11)
RDW: 13.1 % (ref 11.5–15.5)
WBC: 9.9 10*3/uL (ref 4.0–10.5)
nRBC: 0 % (ref 0.0–0.2)

## 2023-06-26 SURGERY — ERCP, WITH INTERVENTION IF INDICATED
Anesthesia: General

## 2023-06-26 MED ORDER — CIPROFLOXACIN IN D5W 400 MG/200ML IV SOLN
INTRAVENOUS | Status: DC | PRN
Start: 2023-06-26 — End: 2023-06-26
  Administered 2023-06-26: 400 mg via INTRAVENOUS

## 2023-06-26 MED ORDER — GLUCAGON HCL RDNA (DIAGNOSTIC) 1 MG IJ SOLR
INTRAMUSCULAR | Status: AC
  Filled 2023-06-26: qty 2

## 2023-06-26 MED ORDER — DEXAMETHASONE SODIUM PHOSPHATE 4 MG/ML IJ SOLN
INTRAMUSCULAR | Status: DC | PRN
  Administered 2023-06-26: 5 mg via INTRAVENOUS

## 2023-06-26 MED ORDER — ONDANSETRON HCL 4 MG/2ML IJ SOLN: 4.0000 mg | Freq: Four times a day (QID) | INTRAMUSCULAR | Status: DC | PRN

## 2023-06-26 MED ORDER — ONDANSETRON HCL 4 MG/2ML IJ SOLN
INTRAMUSCULAR | Status: DC | PRN
  Administered 2023-06-26: 4 mg via INTRAVENOUS

## 2023-06-26 MED ORDER — FENTANYL CITRATE (PF) 100 MCG/2ML IJ SOLN
INTRAMUSCULAR | Status: DC | PRN
  Administered 2023-06-26 (×2): 50 ug via INTRAVENOUS

## 2023-06-26 MED ORDER — INDOMETHACIN 50 MG RE SUPP
RECTAL | Status: DC | PRN
  Administered 2023-06-26: 100 mg via RECTAL

## 2023-06-26 MED ORDER — LIDOCAINE 2% (20 MG/ML) 5 ML SYRINGE
INTRAMUSCULAR | Status: DC | PRN
  Administered 2023-06-26: 40 mg via INTRAVENOUS
  Administered 2023-06-26: 60 mg via INTRAVENOUS

## 2023-06-26 MED ORDER — OXYCODONE HCL 5 MG/5ML PO SOLN: 5.0000 mg | Freq: Once | ORAL | Status: DC | PRN

## 2023-06-26 MED ORDER — PROPOFOL 10 MG/ML IV BOLUS
INTRAVENOUS | Status: DC | PRN
  Administered 2023-06-26: 130 mg via INTRAVENOUS

## 2023-06-26 MED ORDER — GLUCAGON HCL RDNA (DIAGNOSTIC) 1 MG IJ SOLR
INTRAMUSCULAR | Status: DC | PRN
  Administered 2023-06-26 (×3): .25 mg via INTRAVENOUS

## 2023-06-26 MED ORDER — OXYCODONE HCL 5 MG PO TABS: 5.0000 mg | ORAL_TABLET | Freq: Once | ORAL | Status: DC | PRN

## 2023-06-26 MED ORDER — FENTANYL CITRATE (PF) 100 MCG/2ML IJ SOLN
INTRAMUSCULAR | Status: AC
  Filled 2023-06-26: qty 2

## 2023-06-26 MED ORDER — SODIUM CHLORIDE 0.9 % IV SOLN
INTRAVENOUS | Status: DC | PRN
  Administered 2023-06-26: 60 mL

## 2023-06-26 MED ORDER — FENTANYL CITRATE (PF) 100 MCG/2ML IJ SOLN
25.0000 ug | INTRAMUSCULAR | Status: DC | PRN
Start: 2023-06-26 — End: 2023-06-26

## 2023-06-26 MED ORDER — CIPROFLOXACIN IN D5W 400 MG/200ML IV SOLN
INTRAVENOUS | Status: AC
  Filled 2023-06-26: qty 200

## 2023-06-26 MED ORDER — LACTATED RINGERS IV SOLN: INTRAVENOUS | Status: DC | PRN

## 2023-06-26 MED ORDER — INDOMETHACIN 50 MG RE SUPP
RECTAL | Status: AC
  Filled 2023-06-26: qty 2

## 2023-06-26 MED ORDER — ROCURONIUM BROMIDE 10 MG/ML (PF) SYRINGE
PREFILLED_SYRINGE | INTRAVENOUS | Status: DC | PRN
  Administered 2023-06-26: 50 mg via INTRAVENOUS

## 2023-06-26 MED ORDER — SUGAMMADEX SODIUM 200 MG/2ML IV SOLN
INTRAVENOUS | Status: DC | PRN
  Administered 2023-06-26: 200 mg via INTRAVENOUS

## 2023-06-26 NOTE — Anesthesia Postprocedure Evaluation (Signed)
 Anesthesia Post Note  Patient: [name]  Procedure(s) Performed: ENDOSCOPIC RETROGRADE CHOLANGIOPANCREATOGRAPHY (ERCP) DILATION, STRICTURE, BILE DUCT     Patient location during evaluation: PACU Anesthesia Type: General Level of consciousness: awake and alert Pain management: pain level controlled Vital Signs Assessment: post-procedure vital signs reviewed and stable Respiratory status: spontaneous breathing, nonlabored ventilation and respiratory function stable Cardiovascular status: stable and blood pressure returned to baseline Anesthetic complications: no   No notable events documented.  Last Vitals:  Vitals:   06/26/23 1550 06/26/23 1624  BP: (!) 173/66 (!) 149/53  Pulse: 77 86  Resp: 19 18  Temp:  36.6 C  SpO2: 93% 93%    Last Pain:  Vitals:   06/26/23 1624  TempSrc: Oral  PainSc:                  Beryle Lathe

## 2023-06-26 NOTE — Anesthesia Postprocedure Evaluation (Signed)
 Anesthesia Post Note  Patient: [name]  Procedure(s) Performed: LAPAROSCOPIC CHOLECYSTECTOMY (Abdomen) INDOCYANINE GREEN FLUORESCENCE IMAGING (ICG) (Abdomen)     Patient location during evaluation: PACU Anesthesia Type: General Level of consciousness: awake and alert Pain management: pain level controlled Vital Signs Assessment: post-procedure vital signs reviewed and stable Respiratory status: spontaneous breathing, nonlabored ventilation, respiratory function stable and patient connected to nasal cannula oxygen Cardiovascular status: blood pressure returned to baseline and stable Postop Assessment: no apparent nausea or vomiting Anesthetic complications: no   No notable events documented.  Last Vitals:    Last Pain:                 Collene Schlichter

## 2023-06-26 NOTE — Progress Notes (Signed)
 Mobility Specialist Progress Note:   06/26/23 0948  Mobility  Activity Ambulated with assistance in hallway  Level of Assistance Standby assist, set-up cues, supervision of patient - no hands on  Assistive Device Front wheel walker  Distance Ambulated (ft) 250 ft  Activity Response Tolerated well  Mobility Referral Yes  Mobility visit 1 Mobility  Mobility Specialist Start Time (ACUTE ONLY) 0920  Mobility Specialist Stop Time (ACUTE ONLY) 0930  Mobility Specialist Time Calculation (min) (ACUTE ONLY) 10 min   Pt received in chair, agreeable to mobility. No physical assist needed during ambulation with RW. C/o R shoulder pain, otherwise asx throughout. Pt returned to chair with call bell in reach and all needs met. Daughter present.   Leory Plowman  Mobility Specialist Please contact via [employer] office at [phone]

## 2023-06-26 NOTE — Progress Notes (Signed)
 PROGRESS NOTE    [name]  ZOX:[id number] DOB: 31-Aug-1940 DOA: 06/24/2023 PCP: Benita Stabile, MD    Brief Narrative:  83 year old with history of hypertension, hyperlipidemia, known cholelithiasis, anxiety depression GERD presented with acute on chronic right upper quadrant abdominal pain.  She has gallbladder attack as per her description for many many years.  In the emergency room she was hemodynamically stable.  AST/ALT 335, 590.  Total bilirubin 2.8.  Lipase 47.  CT scan abdomen pelvis with cholelithiasis without evidence of acute cholecystitis, bile duct was dilated.  She was admitted to the hospital due to ongoing symptoms.  Followed by GI and surgery.  Subjective: Patient seen and examined.  Daughter-in-law at the bedside.  Patient has slight gaseous distention but denies any complaints.  She was asking whether she can go home today after ERCP.  I told her that we need to keep her in the hospital for observation after sphincterotomy and procedure around the pancreas.  Assessment & Plan:   Symptomatic cholelithiasis with suspected choledocholithiasis: Currently hemodynamically stable.  LFTs mildly elevated.  On adequate IV fluids, pain medications. MRCP was positive for biliary duct dilatation with suspected stone.   3/3, lap chole and difficult cholangiogram. 3/4, scheduled for ERCP. LFTs already trending down.  Bilirubin normalized.  Hyponatremia, hypokalemia: Replaced.  On isotonic fluid with improvement.  Chronic medical issues including Essential hypertension, blood pressure stable Hypothyroidism, stable on levothyroxine Hyperlipidemia, statin on hold due to abnormal LFTs. GERD, on PPI Smoker, counseled.  Declines nicotine patch.   DVT prophylaxis: Place and maintain sequential compression device Start: 06/24/23 1652   Code Status: Full code Family Communication: Daughter-in-law at the bedside Disposition Plan: Status is: Inpatient Remains inpatient appropriate  because: Inpatient procedure planned     Consultants:  Gastroenterology Surgery  Procedures:  Planned  Antimicrobials:  Perioperative     Objective: Vitals:   06/25/23 2032 06/25/23 2354 06/26/23 0434 06/26/23 0818  BP: (!) 145/63 (!) 140/61 137/71 (!) 146/61  Pulse: 81 82 73 68  Resp: 16 16 17 18   Temp: 98.4 F (36.9 C) 97.6 F (36.4 C) 97.9 F (36.6 C) (!) 97.4 F (36.3 C)  TempSrc: Oral Oral Oral Oral  SpO2: 91% 92% 95% 96%  Weight:      Height:        Intake/Output Summary (Last 24 hours) at 06/26/2023 1259 Last data filed at 06/26/2023 0900 Gross per 24 hour  Intake 593.42 ml  Output 20 ml  Net 573.42 ml   Filed Weights   06/24/23 0955  Weight: 81.6 kg    Examination:  General exam: Appears calm and comfortable  Respiratory system: Clear to auscultation. Respiratory effort normal. Cardiovascular system: S1 & S2 heard, RRR. Marland Kitchen Gastrointestinal system: Soft.  Mild tenderness along the ports.  Nondistended. Bowel sound present. Central nervous system: Alert and oriented. No focal neurological deficits.    Data Reviewed: I have personally reviewed following labs and imaging studies  CBC: Recent Labs  Lab 06/24/23 1003 06/25/23 0622 06/26/23 0801  WBC 10.1 7.6 9.9  NEUTROABS 7.0 4.6  --   HGB 12.9 11.6* 11.4*  HCT 37.9 34.4* 33.0*  MCV 89.4 88.0 88.5  PLT 247 205 214   Basic Metabolic Panel: Recent Labs  Lab 06/24/23 1003 06/25/23 0622 06/26/23 0801  NA 131* 132* 134*  K 3.4* 3.4* 3.8  CL 94* 98 99  CO2 27 24 24   GLUCOSE 108* 96 120*  BUN 18 12 11   CREATININE 0.91 0.64  0.61  CALCIUM 9.4 9.0 9.0   GFR: Estimated Creatinine Clearance: 56.8 mL/min (by C-G formula based on SCr of 0.61 mg/dL). Liver Function Tests: Recent Labs  Lab 06/24/23 1003 06/25/23 0622 06/26/23 0801  AST 335* 150* 97*  ALT 590* 354* 235*  ALKPHOS 230* 175* 140*  BILITOT 2.8* 1.4* 1.0  PROT 6.8 5.9* 5.7*  ALBUMIN 3.4* 2.9* 2.8*   Recent Labs  Lab  06/24/23 1003  LIPASE 47   No results for input(s): "AMMONIA" in the last 168 hours. Coagulation Profile: Recent Labs  Lab 06/25/23 0622  INR 0.9   Cardiac Enzymes: No results for input(s): "CKTOTAL", "CKMB", "CKMBINDEX", "TROPONINI" in the last 168 hours. BNP (last 3 results) No results for input(s): "PROBNP" in the last 8760 hours. HbA1C: No results for input(s): "HGBA1C" in the last 72 hours. CBG: No results for input(s): "GLUCAP" in the last 168 hours. Lipid Profile: No results for input(s): "CHOL", "HDL", "LDLCALC", "TRIG", "CHOLHDL", "LDLDIRECT" in the last 72 hours. Thyroid Function Tests: No results for input(s): "TSH", "T4TOTAL", "FREET4", "T3FREE", "THYROIDAB" in the last 72 hours. Anemia Panel: No results for input(s): "VITAMINB12", "FOLATE", "FERRITIN", "TIBC", "IRON", "RETICCTPCT" in the last 72 hours. Sepsis Labs: No results for input(s): "PROCALCITON", "LATICACIDVEN" in the last 168 hours.  Recent Results (from the past 240 hours)  Surgical pcr screen     Status: None   Collection Time: 06/25/23  7:11 AM   Specimen: Nasal Mucosa; Nasal Swab  Result Value Ref Range Status   MRSA, PCR NEGATIVE NEGATIVE Final   Staphylococcus aureus NEGATIVE NEGATIVE Final    Comment: (NOTE) The Xpert SA Assay (FDA approved for NASAL specimens in patients 59 years of age and older), is one component of a comprehensive surveillance program. It is not intended to diagnose infection nor to guide or monitor treatment. Performed at [facility], [address], [place], [place] [address]          Radiology Studies: MR ABDOMEN MRCP W WO CONTAST Result Date: 06/24/2023 CLINICAL DATA:  Cholelithiasis.  Elevated liver function studies. EXAM: MRI ABDOMEN WITHOUT AND WITH CONTRAST (INCLUDING MRCP) TECHNIQUE: Multiplanar multisequence MR imaging of the abdomen was performed both before and after the administration of intravenous contrast. Heavily T2-weighted images of the  biliary and pancreatic ducts were obtained, and three-dimensional MRCP images were rendered by post processing. CONTRAST:  8mL GADAVIST GADOBUTROL 1 MMOL/ML IV SOLN COMPARISON:  CT scan 06/24/2023 and PET-CT 09/01/2021 FINDINGS: Lower chest: The heart is within normal limits in size. There is a moderate-sized pericardial effusion. The lung bases are grossly clear. No pulmonary lesions or pleural effusion. Hepatobiliary: There is a vague area of T1 and T2 signal abnormality in segment 7 of the liver measuring approximately 14 mm in size. This demonstrates arterial phase enhancement except for a tiny small central focus of non enhancement. Minimal residual enhancement around the small cystic area but no persistent enhancement otherwise. Indeterminate finding. No discrete mass is identified. Possibly a biliary hamartoma. Abscess is unlikely. Difficult to see this at all on the CT scan except for the small cystic area. There is chronic intra and extrahepatic biliary dilatation. The common bile duct does taper in the head of the pancreas to the ampulla. Small common bile duct stones are noted. Multiple small gallstones are noted. Pancreas: No mass, inflammation or ductal dilatation. There is a 5 mm cyst noted in the pancreatic body closely associated with the main pancreatic duct. This could be an IPMN or a  benign postinflammatory cyst. Spleen:  Within normal limits in size and appearance. Adrenals/Urinary Tract: Small right adrenal gland adenoma noted. This shows loss of signal intensity on the out of phase T1 weighted gradient echo sequence. No change since 2023. No renal lesions are identified. Small nonenhancing cysts do not require any further imaging evaluation or follow-up. Stomach/Bowel: Visualized portions within the abdomen are unremarkable. Vascular/Lymphatic: Aortic atherosclerosis but no aneurysm or dissection. The branch vessels are patent. The major venous structures are patent. No abdominal  lymphadenopathy. Other: No ascites. Stable periumbilical abdominal wall hernia containing fat. Musculoskeletal: No significant bony findings.  Lumbar scoliosis. IMPRESSION: 1. Chronic underlying intra and extrahepatic biliary dilatation. Small common bile duct stones are noted and could be a superimposed obstructive process accounting for the patient's elevated liver function studies. 2. Cholelithiasis. 3. 14 mm vague area of T1 and T2 signal abnormality in segment 7 of the liver. This demonstrates arterial phase enhancement except for a tiny small central focus of non enhancement. No discrete mass is identified. Possibly a biliary hamartoma. Abscess is unlikely. Recommend follow-up MRI in 6 months to reassess. 4. 5 mm cyst in the pancreatic body closely associated with the main pancreatic duct. This could be an IPMN or a benign postinflammatory cyst. Recommend attention on follow-up study. 5. Moderate-sized pericardial effusion. 6. Stable small right adrenal gland adenoma. 7. Stable periumbilical abdominal wall hernia containing fat. Electronically Signed   By: Rudie Meyer M.D.   On: 06/24/2023 19:47   MR 3D Recon At Scanner Result Date: 06/24/2023 CLINICAL DATA:  Cholelithiasis.  Elevated liver function studies. EXAM: MRI ABDOMEN WITHOUT AND WITH CONTRAST (INCLUDING MRCP) TECHNIQUE: Multiplanar multisequence MR imaging of the abdomen was performed both before and after the administration of intravenous contrast. Heavily T2-weighted images of the biliary and pancreatic ducts were obtained, and three-dimensional MRCP images were rendered by post processing. CONTRAST:  8mL GADAVIST GADOBUTROL 1 MMOL/ML IV SOLN COMPARISON:  CT scan 06/24/2023 and PET-CT 09/01/2021 FINDINGS: Lower chest: The heart is within normal limits in size. There is a moderate-sized pericardial effusion. The lung bases are grossly clear. No pulmonary lesions or pleural effusion. Hepatobiliary: There is a vague area of T1 and T2 signal  abnormality in segment 7 of the liver measuring approximately 14 mm in size. This demonstrates arterial phase enhancement except for a tiny small central focus of non enhancement. Minimal residual enhancement around the small cystic area but no persistent enhancement otherwise. Indeterminate finding. No discrete mass is identified. Possibly a biliary hamartoma. Abscess is unlikely. Difficult to see this at all on the CT scan except for the small cystic area. There is chronic intra and extrahepatic biliary dilatation. The common bile duct does taper in the head of the pancreas to the ampulla. Small common bile duct stones are noted. Multiple small gallstones are noted. Pancreas: No mass, inflammation or ductal dilatation. There is a 5 mm cyst noted in the pancreatic body closely associated with the main pancreatic duct. This could be an IPMN or a benign postinflammatory cyst. Spleen:  Within normal limits in size and appearance. Adrenals/Urinary Tract: Small right adrenal gland adenoma noted. This shows loss of signal intensity on the out of phase T1 weighted gradient echo sequence. No change since 2023. No renal lesions are identified. Small nonenhancing cysts do not require any further imaging evaluation or follow-up. Stomach/Bowel: Visualized portions within the abdomen are unremarkable. Vascular/Lymphatic: Aortic atherosclerosis but no aneurysm or dissection. The branch vessels are patent. The major venous structures are  patent. No abdominal lymphadenopathy. Other: No ascites. Stable periumbilical abdominal wall hernia containing fat. Musculoskeletal: No significant bony findings.  Lumbar scoliosis. IMPRESSION: 1. Chronic underlying intra and extrahepatic biliary dilatation. Small common bile duct stones are noted and could be a superimposed obstructive process accounting for the patient's elevated liver function studies. 2. Cholelithiasis. 3. 14 mm vague area of T1 and T2 signal abnormality in segment 7 of the  liver. This demonstrates arterial phase enhancement except for a tiny small central focus of non enhancement. No discrete mass is identified. Possibly a biliary hamartoma. Abscess is unlikely. Recommend follow-up MRI in 6 months to reassess. 4. 5 mm cyst in the pancreatic body closely associated with the main pancreatic duct. This could be an IPMN or a benign postinflammatory cyst. Recommend attention on follow-up study. 5. Moderate-sized pericardial effusion. 6. Stable small right adrenal gland adenoma. 7. Stable periumbilical abdominal wall hernia containing fat. Electronically Signed   By: Rudie Meyer M.D.   On: 06/24/2023 19:47   CT ABDOMEN PELVIS W CONTRAST Result Date: 06/24/2023 CLINICAL DATA:  Right and left lower quadrant abdominal pain EXAM: CT ABDOMEN AND PELVIS WITH CONTRAST TECHNIQUE: Multidetector CT imaging of the abdomen and pelvis was performed using the standard protocol following bolus administration of intravenous contrast. RADIATION DOSE REDUCTION: This exam was performed according to the departmental dose-optimization program which includes automated exposure control, adjustment of the mA and/or kV according to patient size and/or use of iterative reconstruction technique. CONTRAST:  75mL OMNIPAQUE IOHEXOL 350 MG/ML SOLN COMPARISON:  09/02/2011 FINDINGS: Lower chest: No acute pleural or parenchymal lung disease. Trace pericardial fluid unchanged. Hepatobiliary: Multiple calcified gallstones are noted. No evidence of gallbladder wall thickening or pericholecystic fluid to suggest cholecystitis. Mild biliary dilation, with common bile duct measuring 10 mm, likely appropriate for age. No evidence of choledocholithiasis. The liver is unremarkable. Pancreas: Unremarkable. No pancreatic ductal dilatation or surrounding inflammatory changes. Spleen: Normal in size without focal abnormality. Adrenals/Urinary Tract: Stable bilateral adrenal thickening consistent with adenomas or hyperplasia.  Bilateral renal cortical thinning. No urinary tract calculi or obstructive uropathy. Bladder is moderately distended without focal abnormality. Stomach/Bowel: No bowel obstruction or ileus. Normal appendix right lower quadrant. Moderate retained stool throughout the colon. No bowel wall thickening or inflammatory change. Vascular/Lymphatic: Aortic atherosclerosis. No enlarged abdominal or pelvic lymph nodes. Reproductive: Status post hysterectomy. No adnexal masses. Other: No free fluid or free intraperitoneal gas. Fat containing umbilical hernia. Musculoskeletal: No acute or destructive bony abnormalities. Reconstructed images demonstrate no additional findings. IMPRESSION: 1. Cholelithiasis without evidence of acute cholecystitis. 2. Biliary duct dilation, likely appropriate for age. No evidence of choledocholithiasis. 3. Moderate fecal retention consistent with constipation. No bowel obstruction or ileus. 4. Stable fat containing umbilical hernia. 5.  Aortic Atherosclerosis (ICD10-I70.0). Electronically Signed   By: Sharlet Salina M.D.   On: 06/24/2023 14:28        Scheduled Meds:  [MAR Hold] acetaminophen  1,000 mg Oral Q6H   [MAR Hold] clonazePAM  0.5 mg Oral QHS   [MAR Hold] levothyroxine  137 mcg Oral QAC breakfast   [MAR Hold] loratadine  10 mg Oral Daily   [MAR Hold] pantoprazole  40 mg Oral QAC breakfast   [MAR Hold] sodium chloride flush  3 mL Intravenous Q12H   Continuous Infusions:  sodium chloride 75 mL/hr at 06/25/23 1710     LOS: 2 days    Time spent: 40 minutes    Dorcas Carrow, MD [facility]

## 2023-06-26 NOTE — Interval H&P Note (Signed)
 History and Physical Interval Note:  06/26/2023 1:55 PM  [name]  has presented today for surgery, with the diagnosis of Acute abdominal pain, nausea vomiting, cholelithiasis, dilated common bile duct elevated LFTs.  The various methods of treatment have been discussed with the patient and family. After consideration of risks, benefits and other options for treatment, the patient has consented to  Procedure(s): ENDOSCOPIC RETROGRADE CHOLANGIOPANCREATOGRAPHY (ERCP) (N/A) as a surgical intervention.  The patient's history has been reviewed, patient examined, no change in status, stable for surgery.  I have reviewed the patient's chart and labs.  Questions were answered to the patient's satisfaction.     Lynann Bologna

## 2023-06-26 NOTE — Plan of Care (Signed)

## 2023-06-26 NOTE — Anesthesia Preprocedure Evaluation (Signed)
 Anesthesia Evaluation  Patient identified by MRN, date of birth, ID band Patient awake    Reviewed: Allergy & Precautions, H&P , NPO status , Patient's Chart, lab work & pertinent test results  Airway Mallampati: II   Neck ROM: full    Dental   Pulmonary Current Smoker and Patient abstained from smoking.   breath sounds clear to auscultation       Cardiovascular hypertension,  Rhythm:regular Rate:Normal     Neuro/Psych  PSYCHIATRIC DISORDERS Anxiety Depression       GI/Hepatic ,GERD  ,,  Endo/Other  Hypothyroidism    Renal/GU      Musculoskeletal  (+) Arthritis ,    Abdominal   Peds  Hematology   Anesthesia Other Findings   Reproductive/Obstetrics                             Anesthesia Physical Anesthesia Plan  ASA: 3  Anesthesia Plan: General   Post-op Pain Management:    Induction: Intravenous  PONV Risk Score and Plan: 2 and Ondansetron, Dexamethasone and Treatment may vary due to age or medical condition  Airway Management Planned: Oral ETT  Additional Equipment:   Intra-op Plan:   Post-operative Plan: Extubation in OR  Informed Consent: I have reviewed the patients History and Physical, chart, labs and discussed the procedure including the risks, benefits and alternatives for the proposed anesthesia with the patient or authorized representative who has indicated his/her understanding and acceptance.     Dental advisory given  Plan Discussed with: CRNA, Anesthesiologist and Surgeon  Anesthesia Plan Comments:        Anesthesia Quick Evaluation

## 2023-06-26 NOTE — Transfer of Care (Signed)
 Immediate Anesthesia Transfer of Care Note  Patient: [name]  Procedure(s) Performed: ENDOSCOPIC RETROGRADE CHOLANGIOPANCREATOGRAPHY (ERCP) DILATION, STRICTURE, BILE DUCT  Patient Location: PACU  Anesthesia Type:General  Level of Consciousness: awake, alert , oriented, and patient cooperative  Airway & Oxygen Therapy: Patient Spontanous Breathing and Patient connected to nasal cannula oxygen  Post-op Assessment: Report given to RN and Post -op Vital signs reviewed and stable  Post vital signs: Reviewed and stable  Last Vitals:  Vitals Value Taken Time  BP 166/67 06/26/23 1530  Temp 36.5 C 06/26/23 1524  Pulse 87 06/26/23 1533  Resp 21 06/26/23 1533  SpO2 97 % 06/26/23 1533  Vitals shown include unfiled device data.  Last Pain:  Vitals:   06/26/23 1524  TempSrc: Temporal  PainSc: 0-No pain         Complications: No notable events documented.

## 2023-06-26 NOTE — Op Note (Addendum)
 [facility] Patient Name: [name] Procedure Date : 06/26/2023 MRN: [id number] Attending MD: Lynann Bologna , MD, [id number] Date of Birth: 1940/05/15 CSN: [id number] Age: 83 Admit Type: Inpatient Procedure:                ERCP Indications:              Bile duct stone(s) on MRCP. S/P lap chole yesterday. Providers:                Lynann Bologna, MD, Fransisca Connors, Kandice Robinsons, Technician Referring MD:              Medicines:                General Anesthesia, Cipro 400 mg IV, Indomethacin                            100 mg PR, Glucagon 1 mg IV Complications:            none, No immediate complications. Estimated Blood Loss:     Estimated blood loss: none. Procedure:                Pre-Anesthesia Assessment:                           - Prior to the procedure, a History and Physical                            was performed, and patient medications and                            allergies were reviewed. The patient's tolerance of                            previous anesthesia was also reviewed. The risks                            and benefits of the procedure and the sedation                            options and risks were discussed with the patient.                            All questions were answered, and informed consent                            was obtained. Prior Anticoagulants: The patient has                            taken no anticoagulant or antiplatelet agents. ASA                            Grade Assessment: III - A patient with severe  systemic disease. After reviewing the risks and                            benefits, the patient was deemed in satisfactory                            condition to undergo the procedure.                           After obtaining informed consent, the scope was                            passed under direct vision. Throughout the                             procedure, the patient's blood pressure, pulse, and                            oxygen saturations were monitored continuously. The                            [id number]-[id number] ([id number]) Olympus duodenoscope was                            introduced through the mouth, and used to inject                            contrast into and used to inject contrast into the                            bile duct. The ERCP was accomplished without                            difficulty. The patient tolerated the procedure                            well. Scope In: Scope Out: Findings:      A scout film of the abdomen was obtained. Surgical clips, consistent       with a previous cholecystectomy, were seen in the area of the right       upper quadrant of the abdomen. Some gas was noted under the right       hemidiaphragm likely from-laparoscopic cholecystectomy yesterday. The       esophagus was successfully intubated under direct vision. The scope was       advanced to a normal major papilla in the descending duodenum without       detailed examination of the pharynx, larynx and associated structures,       and upper GI tract. The upper GI tract was grossly normal.      The bile duct was deeply cannulated using a double wire approach       followed by insertion of short-nosed traction sphincterotome (on 2nd       attempt). Contrast was injected. I personally interpreted the bile duct       images. Ductal flow of contrast was adequate. Image  quality was       adequate. Contrast extended to the entire biliary tree (except GB).       Evidence of papillary stenosis. Diffuse biliary ductal dilatation with       CBD measuring 15 mm. Multiple small filling defects consistent with       choledocholithiasis. A cholecystectomy had been performed. No leaks.      An 8 mm biliary sphincterotomy was made with a monofilament traction       (standard) sphincterotome using ERBE's endcut mode. There was no        post-sphincterotomy bleeding. Dilation of common bile duct with a       02-02-11 mm balloon (to a maximum balloon size of 11 mm) dilator was       successful. The biliary tree was swept with a 15 mm balloon starting at       the bifurcation several times. Thick Sludge and multiple small stones       were swept from the duct. All stones were removed. No residual filling       defects on postocclusion cholangiogram. There were some air bubbles       noted.      Wire did find pancreatic duct on first attempt. This was promptly       removed. Pancreatic duct not injected. Impression:               - Choledocholithiasis s/p biliary sphincterotomy,                            sphincteroplasty followed by stone extraction.                           - Papillary stenosis.                           - S/P cholecystectomy. No leaks. Recommendation:           - Return patient to hospital ward for ongoing care.                           - Clear liquid diet. Advance to heart healthy diet                            in AM.                           - Hold Lovenox x 48 hours.                           - Recheck CBC, CMP in a.m.                           - Watch for pancreatitis, bleeding, perforation,                            and cholangitis.                           - The findings and recommendations were discussed  with the patient's family. Procedure Code(s):        --- Professional ---                           [id number], 59, Endoscopic retrograde                            cholangiopancreatography (ERCP); with                            trans-endoscopic balloon dilation of                            biliary/pancreatic duct(s) or of ampulla                            (sphincteroplasty), including sphincterotomy, when                            performed, each duct                           43264, Endoscopic retrograde                            cholangiopancreatography (ERCP);  with removal of                            calculi/debris from biliary/pancreatic duct(s)                           60454, Endoscopic catheterization of the biliary                            ductal system, radiological supervision and                            interpretation Diagnosis Code(s):        --- Professional ---                           Z90.49, Acquired absence of other specified parts                            of digestive tract                           K80.50, Calculus of bile duct without cholangitis                            or cholecystitis without obstruction                           K83.8, Other specified diseases of biliary tract CPT copyright 2022 [employer]. All rights reserved. The codes documented in this report are preliminary and upon coder review may  be revised to meet current compliance requirements. Lynann Bologna, MD 06/26/2023 3:26:23 PM This report has been signed electronically. Number  of Addenda: 0

## 2023-06-26 NOTE — Progress Notes (Addendum)
 Progress Note  1 Day Post-Op  Subjective: Pain well controlled - states she has some mild pain around umbilical incision that worsens with cough. She is passing flatus. No n/v. She is eager to get up to chair  Objective: Vital signs in last 24 hours: Temp:  [97.6 F (36.4 C)-98.6 F (37 C)] 97.9 F (36.6 C) (03/04 0434) Pulse Rate:  [66-97] 73 (03/04 0434) Resp:  [15-20] 17 (03/04 0434) BP: (137-176)/(55-74) 137/71 (03/04 0434) SpO2:  [91 %-95 %] 95 % (03/04 0434)    Intake/Output from previous day: 03/03 0701 - 03/04 0700 In: 593.4 [P.O.:120; I.V.:473.4] Out: 20 [Blood:20] Intake/Output this shift: No intake/output data recorded.  PE: General: pleasant, WD, female who is laying in bed in NAD Lungs: Respiratory effort nonlabored on room air Abd: soft, ND. Mild appropriate TTP around incisions which are cdi with surgical glue. Umbilical incision with mild ecchymosis. No erythema or discharge MSK: all 4 extremities are symmetrical with no cyanosis, clubbing, or edema. Skin: warm and dry Psych: A&Ox3 with an appropriate affect.    Lab Results:  Recent Labs    06/24/23 1003 06/25/23 0622  WBC 10.1 7.6  HGB 12.9 11.6*  HCT 37.9 34.4*  PLT 247 205   BMET Recent Labs    06/24/23 1003 06/25/23 0622  NA 131* 132*  K 3.4* 3.4*  CL 94* 98  CO2 27 24  GLUCOSE 108* 96  BUN 18 12  CREATININE 0.91 0.64  CALCIUM 9.4 9.0   PT/INR Recent Labs    06/25/23 0622  LABPROT 12.7  INR 0.9   CMP     Component Value Date/Time   NA 132 (L) 06/25/2023 0622   K 3.4 (L) 06/25/2023 0622   CL 98 06/25/2023 0622   CO2 24 06/25/2023 0622   GLUCOSE 96 06/25/2023 0622   BUN 12 06/25/2023 0622   CREATININE 0.64 06/25/2023 0622   CALCIUM 9.0 06/25/2023 0622   PROT 5.9 (L) 06/25/2023 0622   ALBUMIN 2.9 (L) 06/25/2023 0622   AST 150 (H) 06/25/2023 0622   AST 27 02/28/2013 0000   ALT 354 (H) 06/25/2023 0622   ALKPHOS 175 (H) 06/25/2023 0622   BILITOT 1.4 (H) 06/25/2023  0622   BILITOT 0.4 02/28/2013 0000   GFRNONAA >60 06/25/2023 0622   GFRAA >90 08/11/2011 1141   Lipase     Component Value Date/Time   LIPASE 47 06/24/2023 1003       Studies/Results: MR ABDOMEN MRCP W WO CONTAST Result Date: 06/24/2023 CLINICAL DATA:  Cholelithiasis.  Elevated liver function studies. EXAM: MRI ABDOMEN WITHOUT AND WITH CONTRAST (INCLUDING MRCP) TECHNIQUE: Multiplanar multisequence MR imaging of the abdomen was performed both before and after the administration of intravenous contrast. Heavily T2-weighted images of the biliary and pancreatic ducts were obtained, and three-dimensional MRCP images were rendered by post processing. CONTRAST:  8mL GADAVIST GADOBUTROL 1 MMOL/ML IV SOLN COMPARISON:  CT scan 06/24/2023 and PET-CT 09/01/2021 FINDINGS: Lower chest: The heart is within normal limits in size. There is a moderate-sized pericardial effusion. The lung bases are grossly clear. No pulmonary lesions or pleural effusion. Hepatobiliary: There is a vague area of T1 and T2 signal abnormality in segment 7 of the liver measuring approximately 14 mm in size. This demonstrates arterial phase enhancement except for a tiny small central focus of non enhancement. Minimal residual enhancement around the small cystic area but no persistent enhancement otherwise. Indeterminate finding. No discrete mass is identified. Possibly a biliary hamartoma. Abscess is unlikely.  Difficult to see this at all on the CT scan except for the small cystic area. There is chronic intra and extrahepatic biliary dilatation. The common bile duct does taper in the head of the pancreas to the ampulla. Small common bile duct stones are noted. Multiple small gallstones are noted. Pancreas: No mass, inflammation or ductal dilatation. There is a 5 mm cyst noted in the pancreatic body closely associated with the main pancreatic duct. This could be an IPMN or a benign postinflammatory cyst. Spleen:  Within normal limits in size  and appearance. Adrenals/Urinary Tract: Small right adrenal gland adenoma noted. This shows loss of signal intensity on the out of phase T1 weighted gradient echo sequence. No change since 2023. No renal lesions are identified. Small nonenhancing cysts do not require any further imaging evaluation or follow-up. Stomach/Bowel: Visualized portions within the abdomen are unremarkable. Vascular/Lymphatic: Aortic atherosclerosis but no aneurysm or dissection. The branch vessels are patent. The major venous structures are patent. No abdominal lymphadenopathy. Other: No ascites. Stable periumbilical abdominal wall hernia containing fat. Musculoskeletal: No significant bony findings.  Lumbar scoliosis. IMPRESSION: 1. Chronic underlying intra and extrahepatic biliary dilatation. Small common bile duct stones are noted and could be a superimposed obstructive process accounting for the patient's elevated liver function studies. 2. Cholelithiasis. 3. 14 mm vague area of T1 and T2 signal abnormality in segment 7 of the liver. This demonstrates arterial phase enhancement except for a tiny small central focus of non enhancement. No discrete mass is identified. Possibly a biliary hamartoma. Abscess is unlikely. Recommend follow-up MRI in 6 months to reassess. 4. 5 mm cyst in the pancreatic body closely associated with the main pancreatic duct. This could be an IPMN or a benign postinflammatory cyst. Recommend attention on follow-up study. 5. Moderate-sized pericardial effusion. 6. Stable small right adrenal gland adenoma. 7. Stable periumbilical abdominal wall hernia containing fat. Electronically Signed   By: Rudie Meyer M.D.   On: 06/24/2023 19:47   MR 3D Recon At Scanner Result Date: 06/24/2023 CLINICAL DATA:  Cholelithiasis.  Elevated liver function studies. EXAM: MRI ABDOMEN WITHOUT AND WITH CONTRAST (INCLUDING MRCP) TECHNIQUE: Multiplanar multisequence MR imaging of the abdomen was performed both before and after the  administration of intravenous contrast. Heavily T2-weighted images of the biliary and pancreatic ducts were obtained, and three-dimensional MRCP images were rendered by post processing. CONTRAST:  8mL GADAVIST GADOBUTROL 1 MMOL/ML IV SOLN COMPARISON:  CT scan 06/24/2023 and PET-CT 09/01/2021 FINDINGS: Lower chest: The heart is within normal limits in size. There is a moderate-sized pericardial effusion. The lung bases are grossly clear. No pulmonary lesions or pleural effusion. Hepatobiliary: There is a vague area of T1 and T2 signal abnormality in segment 7 of the liver measuring approximately 14 mm in size. This demonstrates arterial phase enhancement except for a tiny small central focus of non enhancement. Minimal residual enhancement around the small cystic area but no persistent enhancement otherwise. Indeterminate finding. No discrete mass is identified. Possibly a biliary hamartoma. Abscess is unlikely. Difficult to see this at all on the CT scan except for the small cystic area. There is chronic intra and extrahepatic biliary dilatation. The common bile duct does taper in the head of the pancreas to the ampulla. Small common bile duct stones are noted. Multiple small gallstones are noted. Pancreas: No mass, inflammation or ductal dilatation. There is a 5 mm cyst noted in the pancreatic body closely associated with the main pancreatic duct. This could be an IPMN or a  benign postinflammatory cyst. Spleen:  Within normal limits in size and appearance. Adrenals/Urinary Tract: Small right adrenal gland adenoma noted. This shows loss of signal intensity on the out of phase T1 weighted gradient echo sequence. No change since 2023. No renal lesions are identified. Small nonenhancing cysts do not require any further imaging evaluation or follow-up. Stomach/Bowel: Visualized portions within the abdomen are unremarkable. Vascular/Lymphatic: Aortic atherosclerosis but no aneurysm or dissection. The branch vessels are  patent. The major venous structures are patent. No abdominal lymphadenopathy. Other: No ascites. Stable periumbilical abdominal wall hernia containing fat. Musculoskeletal: No significant bony findings.  Lumbar scoliosis. IMPRESSION: 1. Chronic underlying intra and extrahepatic biliary dilatation. Small common bile duct stones are noted and could be a superimposed obstructive process accounting for the patient's elevated liver function studies. 2. Cholelithiasis. 3. 14 mm vague area of T1 and T2 signal abnormality in segment 7 of the liver. This demonstrates arterial phase enhancement except for a tiny small central focus of non enhancement. No discrete mass is identified. Possibly a biliary hamartoma. Abscess is unlikely. Recommend follow-up MRI in 6 months to reassess. 4. 5 mm cyst in the pancreatic body closely associated with the main pancreatic duct. This could be an IPMN or a benign postinflammatory cyst. Recommend attention on follow-up study. 5. Moderate-sized pericardial effusion. 6. Stable small right adrenal gland adenoma. 7. Stable periumbilical abdominal wall hernia containing fat. Electronically Signed   By: Rudie Meyer M.D.   On: 06/24/2023 19:47   CT ABDOMEN PELVIS W CONTRAST Result Date: 06/24/2023 CLINICAL DATA:  Right and left lower quadrant abdominal pain EXAM: CT ABDOMEN AND PELVIS WITH CONTRAST TECHNIQUE: Multidetector CT imaging of the abdomen and pelvis was performed using the standard protocol following bolus administration of intravenous contrast. RADIATION DOSE REDUCTION: This exam was performed according to the departmental dose-optimization program which includes automated exposure control, adjustment of the mA and/or kV according to patient size and/or use of iterative reconstruction technique. CONTRAST:  75mL OMNIPAQUE IOHEXOL 350 MG/ML SOLN COMPARISON:  09/02/2011 FINDINGS: Lower chest: No acute pleural or parenchymal lung disease. Trace pericardial fluid unchanged. Hepatobiliary:  Multiple calcified gallstones are noted. No evidence of gallbladder wall thickening or pericholecystic fluid to suggest cholecystitis. Mild biliary dilation, with common bile duct measuring 10 mm, likely appropriate for age. No evidence of choledocholithiasis. The liver is unremarkable. Pancreas: Unremarkable. No pancreatic ductal dilatation or surrounding inflammatory changes. Spleen: Normal in size without focal abnormality. Adrenals/Urinary Tract: Stable bilateral adrenal thickening consistent with adenomas or hyperplasia. Bilateral renal cortical thinning. No urinary tract calculi or obstructive uropathy. Bladder is moderately distended without focal abnormality. Stomach/Bowel: No bowel obstruction or ileus. Normal appendix right lower quadrant. Moderate retained stool throughout the colon. No bowel wall thickening or inflammatory change. Vascular/Lymphatic: Aortic atherosclerosis. No enlarged abdominal or pelvic lymph nodes. Reproductive: Status post hysterectomy. No adnexal masses. Other: No free fluid or free intraperitoneal gas. Fat containing umbilical hernia. Musculoskeletal: No acute or destructive bony abnormalities. Reconstructed images demonstrate no additional findings. IMPRESSION: 1. Cholelithiasis without evidence of acute cholecystitis. 2. Biliary duct dilation, likely appropriate for age. No evidence of choledocholithiasis. 3. Moderate fecal retention consistent with constipation. No bowel obstruction or ileus. 4. Stable fat containing umbilical hernia. 5.  Aortic Atherosclerosis (ICD10-I70.0). Electronically Signed   By: Sharlet Salina M.D.   On: 06/24/2023 14:28    Anti-infectives: Anti-infectives (From admission, onward)    None        Assessment/Plan Cholecystitis with Mirizzi's syndrome Choledocholithiasis POD1 lap chole with  primary repair of umbilical hernia Dr. Dwain Sarna 3/3  - ERCP by GI today - labs this am pending - can advance diet as tolerated to reg diet after ERCP  from our standpoint - encouraged ambulation and IS use    FEN: NPO for ERCP ID: [email or web address] periop VTE: lovenox on hold for ERCP    LOS: 2 days   Eric Form, [facility] Surgery 06/26/2023, 7:56 AM Please see Amion for pager number during day hours 7:00am-4:30pm

## 2023-06-26 NOTE — Anesthesia Procedure Notes (Signed)
 Procedure Name: Intubation Date/Time: 06/26/2023 2:07 PM  Performed by: Stanton Kidney, CRNAPre-anesthesia Checklist: Patient identified, Patient being monitored, Timeout performed, Emergency Drugs available and Suction available Patient Re-evaluated:Patient Re-evaluated prior to induction Oxygen Delivery Method: Circle system utilized Preoxygenation: Pre-oxygenation with 100% oxygen Induction Type: IV induction Ventilation: Mask ventilation without difficulty Laryngoscope Size: 3 and Miller Grade View: Grade I Tube type: Oral Tube size: 7.0 mm Number of attempts: 1 Airway Equipment and Method: Stylet Placement Confirmation: ETT inserted through vocal cords under direct vision, positive ETCO2 and breath sounds checked- equal and bilateral Secured at: 22 cm Tube secured with: Tape Dental Injury: Teeth and Oropharynx as per pre-operative assessment

## 2023-06-27 ENCOUNTER — Other Ambulatory Visit (HOSPITAL_COMMUNITY): Payer: Self-pay

## 2023-06-27 DIAGNOSIS — K8050 Calculus of bile duct without cholangitis or cholecystitis without obstruction: Secondary | ICD-10-CM | POA: Diagnosis not present

## 2023-06-27 LAB — COMPREHENSIVE METABOLIC PANEL
ALT: 227 U/L — ABNORMAL HIGH (ref 0–44)
AST: 135 U/L — ABNORMAL HIGH (ref 15–41)
Albumin: 2.8 g/dL — ABNORMAL LOW (ref 3.5–5.0)
Alkaline Phosphatase: 149 U/L — ABNORMAL HIGH (ref 38–126)
Anion gap: 11 (ref 5–15)
BUN: 12 mg/dL (ref 8–23)
CO2: 26 mmol/L (ref 22–32)
Calcium: 9 mg/dL (ref 8.9–10.3)
Chloride: 97 mmol/L — ABNORMAL LOW (ref 98–111)
Creatinine, Ser: 0.73 mg/dL (ref 0.44–1.00)
GFR, Estimated: >60 mL/min (ref >60)
Glucose, Bld: 110 mg/dL — ABNORMAL HIGH (ref 70–99)
Potassium: 3.9 mmol/L (ref 3.5–5.1)
Sodium: 134 mmol/L — ABNORMAL LOW (ref 135–145)
Total Bilirubin: 1.7 mg/dL — ABNORMAL HIGH (ref 0.0–1.2)
Total Protein: 5.9 g/dL — ABNORMAL LOW (ref 6.5–8.1)

## 2023-06-27 LAB — CBC
HCT: 34.3 % — ABNORMAL LOW (ref 36.0–46.0)
Hemoglobin: 11.5 g/dL — ABNORMAL LOW (ref 12.0–15.0)
MCH: 29.9 pg (ref 26.0–34.0)
MCHC: 33.5 g/dL (ref 30.0–36.0)
MCV: 89.1 fL (ref 80.0–100.0)
Platelets: 239 10*3/uL (ref 150–400)
RBC: 3.85 MIL/uL — ABNORMAL LOW (ref 3.87–5.11)
RDW: 13.2 % (ref 11.5–15.5)
WBC: 8.1 10*3/uL (ref 4.0–10.5)
nRBC: 0 % (ref 0.0–0.2)

## 2023-06-27 LAB — SURGICAL PATHOLOGY

## 2023-06-27 MED ORDER — OXYCODONE HCL 5 MG PO TABS
2.5000 mg | ORAL_TABLET | Freq: Four times a day (QID) | ORAL | 0 refills | Status: DC | PRN
Start: 2023-06-27 — End: 2024-03-02
  Filled 2023-06-27: qty 10, 3d supply, fill #0

## 2023-06-27 NOTE — Progress Notes (Signed)
 1 Day Post-Op  Subjective: CC: RUQ abdominal pain has improved and is well controlled with po medications. Some R posterior shoulder pain that improves with walking. No cp or sob. Tolerating cld without worsening abdominal pain, n/v. Passing flatus. Voiding. Plans to stay with her DIL at d/c.   Afebrile. No tachycardia or systolic hypotension. Labs pending.   Objective: Vital signs in last 24 hours: Temp:  [97.4 F (36.3 C)-98.2 F (36.8 C)] 97.8 F (36.6 C) (03/05 0342) Pulse Rate:  [68-88] 81 (03/05 0342) Resp:  [16-23] 18 (03/05 0342) BP: (140-177)/(53-92) 151/77 (03/05 0342) SpO2:  [90 %-98 %] 90 % (03/05 0342) Weight:  [81.6 kg] 81.6 kg (03/04 1247) Last BM Date : 06/23/23  Intake/Output from previous day: No intake/output data recorded. Intake/Output this shift: No intake/output data recorded.  PE: Gen:  Alert, NAD, pleasant Card:  Reg Pulm:  CTA b/l. Rate and effort normal. On RA.  Abd: Soft, no distension, some RUQ ttp and appropriately tender around laparoscopic incisions, no rigidity or guarding and otherwise NT, +BS. Incisions with glue intact appears well and are without drainage, bleeding, or signs of infection  Ext:  No LE edema  Psych: A&Ox3   Lab Results:  Recent Labs    06/25/23 0622 06/26/23 0801  WBC 7.6 9.9  HGB 11.6* 11.4*  HCT 34.4* 33.0*  PLT 205 214   BMET Recent Labs    06/25/23 0622 06/26/23 0801  NA 132* 134*  K 3.4* 3.8  CL 98 99  CO2 24 24  GLUCOSE 96 120*  BUN 12 11  CREATININE 0.64 0.61  CALCIUM 9.0 9.0   PT/INR Recent Labs    06/25/23 0622  LABPROT 12.7  INR 0.9   CMP     Component Value Date/Time   NA 134 (L) 06/26/2023 0801   K 3.8 06/26/2023 0801   CL 99 06/26/2023 0801   CO2 24 06/26/2023 0801   GLUCOSE 120 (H) 06/26/2023 0801   BUN 11 06/26/2023 0801   CREATININE 0.61 06/26/2023 0801   CALCIUM 9.0 06/26/2023 0801   PROT 5.7 (L) 06/26/2023 0801   ALBUMIN 2.8 (L) 06/26/2023 0801   AST 97 (H)  06/26/2023 0801   AST 27 02/28/2013 0000   ALT 235 (H) 06/26/2023 0801   ALKPHOS 140 (H) 06/26/2023 0801   BILITOT 1.0 06/26/2023 0801   BILITOT 0.4 02/28/2013 0000   GFRNONAA >60 06/26/2023 0801   GFRAA >90 08/11/2011 1141   Lipase     Component Value Date/Time   LIPASE 47 06/24/2023 1003    Studies/Results: DG C-Arm 1-60 Min-No Report Result Date: 06/26/2023 Fluoroscopy was utilized by the requesting physician.  No radiographic interpretation.    Anti-infectives: Anti-infectives (From admission, onward)    None        Assessment/Plan POD 2 s/p lap chole with primary repair of umbilical hernia Dr. Dwain Sarna 3/3 for Chronic cholecystitis and Mirizzis syndrome  - S/p ERCP 3/4 w/ findings of Choledocholithiasis tx w/ biliary sphincterotomy, sphincteroplasty followed by stone extraction. Papillary stenosis noted during ERCP. - AM labs pending. - Adv diet.  - Mobilize, pulm toilet.  - If labs look okay and tolerates diet advancement, okay for d/c from our standpoint. Discussed discharge instructions, restrictions and return/call back precautions. Follow up arranged. Pain medication sent to her pharmacy.   FEN - Reg diet.  VTE - SCDs, Hold Lovenox x 48 hours after ERCP per GI. okay for chem ppx from a general surgery standpoint ID -  Peri-op abx. None currently.     LOS: 3 days    Jacinto Halim, [facility] Surgery 06/27/2023, 7:43 AM Please see Amion for pager number during day hours 7:00am-4:30pm

## 2023-06-27 NOTE — Discharge Summary (Signed)
 Physician Discharge Summary   Patient: [name] MRN: [id number] DOB: May 19, 1940  Admit date:     06/24/2023  Discharge date: 06/27/23  Discharge Physician: Rickey Barbara   PCP: Benita Stabile, MD   Recommendations at discharge:    Follow up with PCP in 1-2 weeks Follow up with General Surgery as scheduled Recommend recheck comprehensive metabolic panel in 1 week, focus on LFT's  Discharge Diagnoses: Principal Problem:   Choledocholithiasis with obstruction Active Problems:   Elevated liver function tests   Hyponatremia   Hypokalemia   Hypochloremia   Essential hypertension   Hypothyroidism   Hyperlipidemia   ANXIETY DEPRESSION   Nodule of upper lobe of right lung   HX, PERSONAL, MALIGNANCY, BREAST   GERD   TOBACCO ABUSE  Resolved Problems:   * No resolved hospital problems. [facility] Course: 83 year old with history of hypertension, hyperlipidemia, known cholelithiasis, anxiety depression GERD presented with acute on chronic right upper quadrant abdominal pain. She has gallbladder attack as per her description for many many years. In the emergency room she was hemodynamically stable. AST/ALT 335, 590. Total bilirubin 2.8. Lipase 47. CT scan abdomen pelvis with cholelithiasis without evidence of acute cholecystitis, bile duct was dilated. She was admitted to the hospital due to ongoing symptoms. Followed by GI and surgery.   Assessment and Plan: Symptomatic cholelithiasis with suspected choledocholithiasis: Remained hemodynamically stable.  LFTs mildly elevated but remained stable. MRCP was positive for biliary duct dilatation with suspected stone.   3/3, lap chole and difficult cholangiogram. 3/4, underwent ERCP with sphincterectomy, sphincteroplasty followed by stone extraction, papillary stenosis noted during ERCP -Diet advanced per GI -OK to discharge per General Surgery   Hyponatremia, hypokalemia: Replaced.  On isotonic fluid with improvement.   Essential  hypertension, blood pressure stable  Hypothyroidism, stable on levothyroxine  Hyperlipidemia, statin on hold due to abnormal LFTs.  GERD, continued on PPI  Tobacco abuse, counseled.  Declines nicotine patch.    Consultants: GI, General Surgery Procedures performed: Cholecystectomy, ERCP  Disposition: Home Diet recommendation:  Bland diet DISCHARGE MEDICATION: Allergies as of 06/27/2023       Reactions   Sulfa Antibiotics Anaphylaxis        Medication List     STOP taking these medications    atorvastatin 40 MG tablet Commonly known as: LIPITOR   diazepam 5 MG tablet Commonly known as: VALIUM       TAKE these medications    acetaminophen 500 MG tablet Commonly known as: TYLENOL Take 1,000 mg by mouth daily as needed (for pain).   amLODipine 5 MG tablet Commonly known as: NORVASC Take 5 mg by mouth daily.   aspirin EC 81 MG tablet Take 81 mg by mouth daily.   carvedilol 3.125 MG tablet Commonly known as: COREG Take 3.125 mg by mouth in the morning and at bedtime.   cetirizine 10 MG tablet Commonly known as: ZYRTEC Take 10 mg by mouth at bedtime.   clonazePAM 0.5 MG tablet Commonly known as: KLONOPIN Take 0.25 mg by mouth See admin instructions. Take 0.25 mg by mouth at bedtime and an additional 0.25 mg once a day as needed for anxiety   hydrochlorothiazide 25 MG tablet Commonly known as: HYDRODIURIL Take 25 mg by mouth daily.   levothyroxine 137 MCG tablet Commonly known as: SYNTHROID Take 137 mcg by mouth daily before breakfast.   lisinopril 40 MG tablet Commonly known as: ZESTRIL Take 40 mg by mouth daily.   oxyCODONE 5 MG immediate  release tablet Commonly known as: Oxy IR/ROXICODONE Take 0.5-1 tablets (2.5-5 mg total) by mouth every 6 (six) hours as needed for breakthrough pain.   pantoprazole 40 MG tablet Commonly known as: PROTONIX Take 40 mg by mouth daily before breakfast.   Tums 500 MG chewable tablet Generic drug: calcium  carbonate Chew 1-2 tablets by mouth daily as needed for indigestion or heartburn.        Follow-up Information     Maczis, Hedda Slade, PA-C Follow up.   Specialty: General Surgery Why: Call to confirm your appointment date and time, bring a copy of your photo ID and insurance card, arrive 30 minutes prior to your appointment Contact information: [address] [place] [address] [phone]         Benita Stabile, MD Follow up in 2 week(s).   Specialty: Internal Medicine Why: Hospital follow up Contact information: [address] [place] [facility] [address] [phone]                Discharge Exam: Filed Weights   06/24/23 0955 06/26/23 1247  Weight: 81.6 kg 81.6 kg   General exam: Awake, laying in bed, in nad Respiratory system: Normal respiratory effort, no wheezing Cardiovascular system: regular rate, s1, s2 Gastrointestinal system: Soft, nondistended, positive BS Central nervous system: CN2-12 grossly intact, strength intact Extremities: Perfused, no clubbing Skin: Normal skin turgor, no notable skin lesions seen Psychiatry: Mood normal // no visual hallucinations   Condition at discharge: fair  The results of significant diagnostics from this hospitalization (including imaging, microbiology, ancillary and laboratory) are listed below for reference.   Imaging Studies: DG ERCP Result Date: 06/27/2023 CLINICAL DATA:  Cholelithiasis, choledocholithiasis EXAM: ERCP TECHNIQUE: Multiple spot images obtained with the fluoroscopic device and submitted for interpretation post-procedure. COMPARISON:  MRCP 06/24/2023 FINDINGS: A series of fluoroscopic spot images document endoscopic cannulation of the pancreatic duct and CBD, subsequent opacification of the CBD, dilatation of the distal CBD, and passage of a balloon catheter through the CBD. CBD and central intrahepatic biliary ducts are dilated. IMPRESSION: Endoscopic CBD cannulation and intervention as  above. These images were submitted for radiologic interpretation only. Please see the procedural report for the amount of contrast and the fluoroscopy time utilized. Electronically Signed   By: Corlis Leak M.D.   On: 06/27/2023 09:05   DG C-Arm 1-60 Min-No Report Result Date: 06/26/2023 Fluoroscopy was utilized by the requesting physician.  No radiographic interpretation.   MR ABDOMEN MRCP W WO CONTAST Result Date: 06/24/2023 CLINICAL DATA:  Cholelithiasis.  Elevated liver function studies. EXAM: MRI ABDOMEN WITHOUT AND WITH CONTRAST (INCLUDING MRCP) TECHNIQUE: Multiplanar multisequence MR imaging of the abdomen was performed both before and after the administration of intravenous contrast. Heavily T2-weighted images of the biliary and pancreatic ducts were obtained, and three-dimensional MRCP images were rendered by post processing. CONTRAST:  8mL GADAVIST GADOBUTROL 1 MMOL/ML IV SOLN COMPARISON:  CT scan 06/24/2023 and PET-CT 09/01/2021 FINDINGS: Lower chest: The heart is within normal limits in size. There is a moderate-sized pericardial effusion. The lung bases are grossly clear. No pulmonary lesions or pleural effusion. Hepatobiliary: There is a vague area of T1 and T2 signal abnormality in segment 7 of the liver measuring approximately 14 mm in size. This demonstrates arterial phase enhancement except for a tiny small central focus of non enhancement. Minimal residual enhancement around the small cystic area but no persistent enhancement otherwise. Indeterminate finding. No discrete mass is identified. Possibly a biliary hamartoma.  Abscess is unlikely. Difficult to see this at all on the CT scan except for the small cystic area. There is chronic intra and extrahepatic biliary dilatation. The common bile duct does taper in the head of the pancreas to the ampulla. Small common bile duct stones are noted. Multiple small gallstones are noted. Pancreas: No mass, inflammation or ductal dilatation. There is a 5 mm  cyst noted in the pancreatic body closely associated with the main pancreatic duct. This could be an IPMN or a benign postinflammatory cyst. Spleen:  Within normal limits in size and appearance. Adrenals/Urinary Tract: Small right adrenal gland adenoma noted. This shows loss of signal intensity on the out of phase T1 weighted gradient echo sequence. No change since 2023. No renal lesions are identified. Small nonenhancing cysts do not require any further imaging evaluation or follow-up. Stomach/Bowel: Visualized portions within the abdomen are unremarkable. Vascular/Lymphatic: Aortic atherosclerosis but no aneurysm or dissection. The branch vessels are patent. The major venous structures are patent. No abdominal lymphadenopathy. Other: No ascites. Stable periumbilical abdominal wall hernia containing fat. Musculoskeletal: No significant bony findings.  Lumbar scoliosis. IMPRESSION: 1. Chronic underlying intra and extrahepatic biliary dilatation. Small common bile duct stones are noted and could be a superimposed obstructive process accounting for the patient's elevated liver function studies. 2. Cholelithiasis. 3. 14 mm vague area of T1 and T2 signal abnormality in segment 7 of the liver. This demonstrates arterial phase enhancement except for a tiny small central focus of non enhancement. No discrete mass is identified. Possibly a biliary hamartoma. Abscess is unlikely. Recommend follow-up MRI in 6 months to reassess. 4. 5 mm cyst in the pancreatic body closely associated with the main pancreatic duct. This could be an IPMN or a benign postinflammatory cyst. Recommend attention on follow-up study. 5. Moderate-sized pericardial effusion. 6. Stable small right adrenal gland adenoma. 7. Stable periumbilical abdominal wall hernia containing fat. Electronically Signed   By: Rudie Meyer M.D.   On: 06/24/2023 19:47   MR 3D Recon At Scanner Result Date: 06/24/2023 CLINICAL DATA:  Cholelithiasis.  Elevated liver  function studies. EXAM: MRI ABDOMEN WITHOUT AND WITH CONTRAST (INCLUDING MRCP) TECHNIQUE: Multiplanar multisequence MR imaging of the abdomen was performed both before and after the administration of intravenous contrast. Heavily T2-weighted images of the biliary and pancreatic ducts were obtained, and three-dimensional MRCP images were rendered by post processing. CONTRAST:  8mL GADAVIST GADOBUTROL 1 MMOL/ML IV SOLN COMPARISON:  CT scan 06/24/2023 and PET-CT 09/01/2021 FINDINGS: Lower chest: The heart is within normal limits in size. There is a moderate-sized pericardial effusion. The lung bases are grossly clear. No pulmonary lesions or pleural effusion. Hepatobiliary: There is a vague area of T1 and T2 signal abnormality in segment 7 of the liver measuring approximately 14 mm in size. This demonstrates arterial phase enhancement except for a tiny small central focus of non enhancement. Minimal residual enhancement around the small cystic area but no persistent enhancement otherwise. Indeterminate finding. No discrete mass is identified. Possibly a biliary hamartoma. Abscess is unlikely. Difficult to see this at all on the CT scan except for the small cystic area. There is chronic intra and extrahepatic biliary dilatation. The common bile duct does taper in the head of the pancreas to the ampulla. Small common bile duct stones are noted. Multiple small gallstones are noted. Pancreas: No mass, inflammation or ductal dilatation. There is a 5 mm cyst noted in the pancreatic body closely associated with the main pancreatic duct. This could be an  IPMN or a benign postinflammatory cyst. Spleen:  Within normal limits in size and appearance. Adrenals/Urinary Tract: Small right adrenal gland adenoma noted. This shows loss of signal intensity on the out of phase T1 weighted gradient echo sequence. No change since 2023. No renal lesions are identified. Small nonenhancing cysts do not require any further imaging evaluation or  follow-up. Stomach/Bowel: Visualized portions within the abdomen are unremarkable. Vascular/Lymphatic: Aortic atherosclerosis but no aneurysm or dissection. The branch vessels are patent. The major venous structures are patent. No abdominal lymphadenopathy. Other: No ascites. Stable periumbilical abdominal wall hernia containing fat. Musculoskeletal: No significant bony findings.  Lumbar scoliosis. IMPRESSION: 1. Chronic underlying intra and extrahepatic biliary dilatation. Small common bile duct stones are noted and could be a superimposed obstructive process accounting for the patient's elevated liver function studies. 2. Cholelithiasis. 3. 14 mm vague area of T1 and T2 signal abnormality in segment 7 of the liver. This demonstrates arterial phase enhancement except for a tiny small central focus of non enhancement. No discrete mass is identified. Possibly a biliary hamartoma. Abscess is unlikely. Recommend follow-up MRI in 6 months to reassess. 4. 5 mm cyst in the pancreatic body closely associated with the main pancreatic duct. This could be an IPMN or a benign postinflammatory cyst. Recommend attention on follow-up study. 5. Moderate-sized pericardial effusion. 6. Stable small right adrenal gland adenoma. 7. Stable periumbilical abdominal wall hernia containing fat. Electronically Signed   By: Rudie Meyer M.D.   On: 06/24/2023 19:47   CT ABDOMEN PELVIS W CONTRAST Result Date: 06/24/2023 CLINICAL DATA:  Right and left lower quadrant abdominal pain EXAM: CT ABDOMEN AND PELVIS WITH CONTRAST TECHNIQUE: Multidetector CT imaging of the abdomen and pelvis was performed using the standard protocol following bolus administration of intravenous contrast. RADIATION DOSE REDUCTION: This exam was performed according to the departmental dose-optimization program which includes automated exposure control, adjustment of the mA and/or kV according to patient size and/or use of iterative reconstruction technique. CONTRAST:   75mL OMNIPAQUE IOHEXOL 350 MG/ML SOLN COMPARISON:  09/02/2011 FINDINGS: Lower chest: No acute pleural or parenchymal lung disease. Trace pericardial fluid unchanged. Hepatobiliary: Multiple calcified gallstones are noted. No evidence of gallbladder wall thickening or pericholecystic fluid to suggest cholecystitis. Mild biliary dilation, with common bile duct measuring 10 mm, likely appropriate for age. No evidence of choledocholithiasis. The liver is unremarkable. Pancreas: Unremarkable. No pancreatic ductal dilatation or surrounding inflammatory changes. Spleen: Normal in size without focal abnormality. Adrenals/Urinary Tract: Stable bilateral adrenal thickening consistent with adenomas or hyperplasia. Bilateral renal cortical thinning. No urinary tract calculi or obstructive uropathy. Bladder is moderately distended without focal abnormality. Stomach/Bowel: No bowel obstruction or ileus. Normal appendix right lower quadrant. Moderate retained stool throughout the colon. No bowel wall thickening or inflammatory change. Vascular/Lymphatic: Aortic atherosclerosis. No enlarged abdominal or pelvic lymph nodes. Reproductive: Status post hysterectomy. No adnexal masses. Other: No free fluid or free intraperitoneal gas. Fat containing umbilical hernia. Musculoskeletal: No acute or destructive bony abnormalities. Reconstructed images demonstrate no additional findings. IMPRESSION: 1. Cholelithiasis without evidence of acute cholecystitis. 2. Biliary duct dilation, likely appropriate for age. No evidence of choledocholithiasis. 3. Moderate fecal retention consistent with constipation. No bowel obstruction or ileus. 4. Stable fat containing umbilical hernia. 5.  Aortic Atherosclerosis (ICD10-I70.0). Electronically Signed   By: Sharlet Salina M.D.   On: 06/24/2023 14:28    Microbiology: Results for orders placed or performed during the hospital encounter of 06/24/23  Surgical pcr screen     Status: None  Collection  Time: 06/25/23  7:11 AM   Specimen: Nasal Mucosa; Nasal Swab  Result Value Ref Range Status   MRSA, PCR NEGATIVE NEGATIVE Final   Staphylococcus aureus NEGATIVE NEGATIVE Final    Comment: (NOTE) The Xpert SA Assay (FDA approved for NASAL specimens in patients 29 years of age and older), is one component of a comprehensive surveillance program. It is not intended to diagnose infection nor to guide or monitor treatment. Performed at [facility], [address], [place], [place] [address]     Labs: CBC: Recent Labs  Lab 06/24/23 1003 06/25/23 0622 06/26/23 0801 06/27/23 0931  WBC 10.1 7.6 9.9 8.1  NEUTROABS 7.0 4.6  --   --   HGB 12.9 11.6* 11.4* 11.5*  HCT 37.9 34.4* 33.0* 34.3*  MCV 89.4 88.0 88.5 89.1  PLT 247 205 214 239   Basic Metabolic Panel: Recent Labs  Lab 06/24/23 1003 06/25/23 0622 06/26/23 0801 06/27/23 0931  NA 131* 132* 134* 134*  K 3.4* 3.4* 3.8 3.9  CL 94* 98 99 97*  CO2 27 24 24 26   GLUCOSE 108* 96 120* 110*  BUN 18 12 11 12   CREATININE 0.91 0.64 0.61 0.73  CALCIUM 9.4 9.0 9.0 9.0   Liver Function Tests: Recent Labs  Lab 06/24/23 1003 06/25/23 0622 06/26/23 0801 06/27/23 0931  AST 335* 150* 97* 135*  ALT 590* 354* 235* 227*  ALKPHOS 230* 175* 140* 149*  BILITOT 2.8* 1.4* 1.0 1.7*  PROT 6.8 5.9* 5.7* 5.9*  ALBUMIN 3.4* 2.9* 2.8* 2.8*   CBG: No results for input(s): "GLUCAP" in the last 168 hours.  Discharge time spent: less than 30 minutes.  Signed: Rickey Barbara, MD [facility] 06/27/2023

## 2023-06-27 NOTE — Progress Notes (Signed)
 Mobility Specialist Progress Note:   06/27/23 1428  Mobility  Activity Ambulated with assistance in hallway  Level of Assistance Standby assist, set-up cues, supervision of patient - no hands on  Assistive Device Front wheel walker  Distance Ambulated (ft) 400 ft  Activity Response Tolerated well  Mobility Referral Yes  Mobility visit 1 Mobility  Mobility Specialist Start Time (ACUTE ONLY) 1345  Mobility Specialist Stop Time (ACUTE ONLY) 1400  Mobility Specialist Time Calculation (min) (ACUTE ONLY) 15 min   Pt received in chair, agreeable to mobility. No physical assist required during ambulation with RW. Pt c/o R shoulder pain and slight leg weakness, otherwise asx throughout. Pt returned to chair with call bell in reach and all needs met.   [name]  Mobility Specialist Please contact via [employer] office at [phone]

## 2023-06-27 NOTE — Hospital Course (Signed)
 83 year old with history of hypertension, hyperlipidemia, known cholelithiasis, anxiety depression GERD presented with acute on chronic right upper quadrant abdominal pain. She has gallbladder attack as per her description for many many years. In the emergency room she was hemodynamically stable. AST/ALT 335, 590. Total bilirubin 2.8. Lipase 47. CT scan abdomen pelvis with cholelithiasis without evidence of acute cholecystitis, bile duct was dilated. She was admitted to the hospital due to ongoing symptoms. Followed by GI and surgery.

## 2023-06-27 NOTE — Progress Notes (Cosign Needed)
 Progress Note   LOS: 3 days   Chief Complaint: Choledocholithiasis   Subjective   S/p ERCP yesterday.  She states today she is doing great and tolerating diet without difficulty.  Her RUQ pain is improving.  Denies nausea, vomiting.     Objective   Vital signs in last 24 hours: Temp:  [97.4 F (36.3 C)-98.2 F (36.8 C)] 97.4 F (36.3 C) (03/05 0746) Pulse Rate:  [66-88] 66 (03/05 0746) Resp:  [16-23] 20 (03/05 0746) BP: (140-177)/(53-92) 165/74 (03/05 0746) SpO2:  [90 %-98 %] 95 % (03/05 0746) Weight:  [81.6 kg] 81.6 kg (03/04 1247) Last BM Date : 06/23/23 Last BM recorded by nurses in past 5 days No data recorded  General:   female in no acute distress  Heart:  Regular rate and rhythm; no murmurs Pulm: Clear anteriorly; no wheezing Abdomen: soft, nondistended, normal bowel sounds in all quadrants.  Mild RUQ tenderness. No organomegaly appreciated. Extremities:  No edema Neurologic:  Alert and  oriented x4;  No focal deficits.  Psych:  Cooperative. Normal mood and affect.  Intake/Output from previous day: No intake/output data recorded. Intake/Output this shift: No intake/output data recorded.  Studies/Results: DG ERCP Result Date: 06/27/2023 CLINICAL DATA:  Cholelithiasis, choledocholithiasis EXAM: ERCP TECHNIQUE: Multiple spot images obtained with the fluoroscopic device and submitted for interpretation post-procedure. COMPARISON:  MRCP 06/24/2023 FINDINGS: A series of fluoroscopic spot images document endoscopic cannulation of the pancreatic duct and CBD, subsequent opacification of the CBD, dilatation of the distal CBD, and passage of a balloon catheter through the CBD. CBD and central intrahepatic biliary ducts are dilated. IMPRESSION: Endoscopic CBD cannulation and intervention as above. These images were submitted for radiologic interpretation only. Please see the procedural report for the amount of contrast and the fluoroscopy time utilized. Electronically Signed    By: Corlis Leak M.D.   On: 06/27/2023 09:05   DG C-Arm 1-60 Min-No Report Result Date: 06/26/2023 Fluoroscopy was utilized by the requesting physician.  No radiographic interpretation.    Lab Results: Recent Labs    06/25/23 0622 06/26/23 0801 06/27/23 0931  WBC 7.6 9.9 8.1  HGB 11.6* 11.4* 11.5*  HCT 34.4* 33.0* 34.3*  PLT 205 214 239   BMET Recent Labs    06/25/23 0622 06/26/23 0801 06/27/23 0931  NA 132* 134* 134*  K 3.4* 3.8 3.9  CL 98 99 97*  CO2 24 24 26   GLUCOSE 96 120* 110*  BUN 12 11 12   CREATININE 0.64 0.61 0.73  CALCIUM 9.0 9.0 9.0   LFT Recent Labs    06/27/23 0931  PROT 5.9*  ALBUMIN 2.8*  AST 135*  ALT 227*  ALKPHOS 149*  BILITOT 1.7*   PT/INR Recent Labs    06/25/23 0622  LABPROT 12.7  INR 0.9     Scheduled Meds:  acetaminophen  1,000 mg Oral Q6H   clonazePAM  0.5 mg Oral QHS   levothyroxine  137 mcg Oral QAC breakfast   loratadine  10 mg Oral Daily   pantoprazole  40 mg Oral QAC breakfast   sodium chloride flush  3 mL Intravenous Q12H   Continuous Infusions:    Patient profile:   83 year old female history of hypertension, hyperlipidemia, hypothyroidism, breast cancer, osteoarthritis, GERD with prior esophageal dilation resented with symptomatic cholelithiasis and imaging with multiple gallstones and no definitive choledocholithiasis.  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  Papillary stenosis noted during ERCP AST 135/ALT 227/alk phos 149 (stable) Total bilirubin 1.7 (1.0 yesterday) - Continue to advance diet as tolerated - Encourage mobilization - Symptomatically improving which is reassuring.  LFTs somewhat stable.  Continue to monitor LFTs, expect them to trend down tomorrow hopefully.   [name] [name]   06/27/2023, 10:30 AM   Attending physician's note   I have taken a history, reviewed the chart and examined the patient. I performed a substantive portion of this encounter, including complete performance of at least one of the key components, in conjunction with the APP. I agree with the APP's note, impression and recommendations.    Overall feels better.  Tolerating diet.  S/p ERCP with biliary sphincterotomy and stone extraction Monitor LFT     Kirtland Bouchard Scherry Ran , MD [phone]

## 2023-06-27 NOTE — Discharge Instructions (Signed)
 [facility]  Please arrive at least 30 min before your appointment to complete your check in paperwork.  If you are unable to arrive 30 min prior to your appointment time we may have to cancel or reschedule you. LAPAROSCOPIC SURGERY: POST OP INSTRUCTIONS Always review your discharge instruction sheet given to you by the facility where your surgery was performed. IF YOU HAVE DISABILITY OR FAMILY LEAVE FORMS, YOU MUST BRING THEM TO THE OFFICE FOR PROCESSING.   DO NOT GIVE THEM TO YOUR DOCTOR.  PAIN CONTROL  First take acetaminophen (Tylenol) to control your pain after surgery.  Follow directions on package.  Taking acetaminophen (Tylenol) regularly after surgery will help to control your pain and lower the amount of prescription pain medication you may need.  You should not take more than 3,000 mg (3 grams) of acetaminophen (Tylenol) in 24 hours.  You should not take ibuprofen (Advil), aleve, motrin, naprosyn or other NSAIDS if you have a history of stomach ulcers or chronic kidney disease.  A prescription for pain medication may be given to you upon discharge.  Take your pain medication as prescribed, if you still have uncontrolled pain after taking acetaminophen (Tylenol) Use ice packs to help control pain. If you need a refill on your pain medication, please contact your pharmacy.  They will contact our office to request authorization. Prescriptions will not be filled after 5pm or on week-ends.  HOME MEDICATIONS Take your usually prescribed medications unless otherwise directed.  DIET You should follow a light diet the first few days after arrival home.  Be sure to include lots of fluids daily. Avoid fatty, fried foods.   CONSTIPATION It is common to experience some constipation after surgery and if you are taking pain medication.  Increasing fluid intake and taking a stool softener (such as Colace) will usually help or prevent this problem from occurring.  A mild  laxative (Milk of Magnesia or Miralax) should be taken according to package instructions if there are no bowel movements after 48 hours.  WOUND/INCISION CARE Most patients will experience some swelling and bruising in the area of the incisions.  Ice packs will help.  Swelling and bruising can take several days to resolve.  Unless discharge instructions indicate otherwise, follow guidelines below  STERI-STRIPS - you may remove your outer bandages 48 hours after surgery, and you may shower at that time.  You have steri-strips (small skin tapes) in place directly over the incision.  These strips should be left on the skin for 7-10 days.   DERMABOND/SKIN GLUE - you may shower in 24 hours.  The glue will flake off over the next 2-3 weeks. Any sutures or staples will be removed at the office during your follow-up visit.  ACTIVITIES You may resume regular (light) daily activities beginning the next day--such as daily self-care, walking, climbing stairs--gradually increasing activities as tolerated.  You may have sexual intercourse when it is comfortable.  Refrain from any heavy lifting or straining until approved by your doctor. Do not lift greater than 5lbs for 6 weeks after surgery or until told otherwise by your surgeon.  You may drive when you are no longer taking prescription pain medication, you can comfortably wear a seatbelt, and you can safely maneuver your car and apply brakes.  FOLLOW-UP You should see your doctor in the office for a follow-up appointment approximately 2-3 weeks after your surgery.  You should have been given your post-op/follow-up appointment when your surgery was scheduled.  If you did not receive a post-op/follow-up appointment, make sure that you call for this appointment within a day or two after you arrive home to insure a convenient appointment time.   WHEN TO CALL YOUR DOCTOR: Fever over 101.0 Inability to urinate Continued bleeding from incision. Increased pain,  redness, or drainage from the incision. Increasing abdominal pain  The clinic staff is available to answer your questions during regular business hours.  Please don't hesitate to call and ask to speak to one of the nurses for clinical concerns.  If you have a medical emergency, go to the nearest emergency room or call 911.  A surgeon from [facility] is always on call at the hospital. [address], [address], [place], [place]  [address] ? [address] [address], [place], [place]   [address] [phone] ? [phone] ? FAX [phone]

## 2023-06-28 ENCOUNTER — Telehealth: Payer: Self-pay

## 2023-06-28 NOTE — Transitions of Care (Post Inpatient/ED Visit) (Signed)
 06/28/2023  Name: [name] MRN: [id number] DOB: [birth year]  Today's TOC FU Call Status: Today's TOC FU Call Status:: Successful TOC FU Call Completed TOC FU Call Complete Date: 06/28/23 Patient's Name and Date of Birth confirmed.  Transition Care Management Follow-up Telephone Call Date of Discharge: 06/27/23 Discharge Facility: [facility]) Type of Discharge: Inpatient Admission Primary Inpatient Discharge Diagnosis:: Choledocholithiasis with obstruction How have you been since you were released from the hospital?: Better Any questions or concerns?: No  Items Reviewed: Did you receive and understand the discharge instructions provided?: Yes Medications obtained,verified, and reconciled?: Yes (Medications Reviewed) (Medication reconciliation completed based on recent discharge summary Patient taking medications as instructed and is aware of any changes or dosage adjustments medication regimen. Patient denies questions and reports no barriers to medication adherence) Any new allergies since your discharge?: No Dietary orders reviewed?: Yes Type of Diet Ordered:: Reg Heart Healthy Low fat Do you have support at home?: Yes Name of Support/Comfort Primary Source: Daughter - Victorino Dike amd son Elige Radon  Medications Reviewed Today: Medications Reviewed Today     Reviewed by Johnnette Barrios, RN (Registered Nurse) on 06/28/23 at 1004  Med List Status: <None>   Medication Order Taking? Sig Documenting Provider Last Dose Status Informant  acetaminophen (TYLENOL) 500 MG tablet [id number] Yes Take 1,000 mg by mouth daily as needed (for pain). [provider] Taking Active Self  amLODipine (NORVASC) 5 MG tablet [id number] Yes Take 5 mg by mouth daily. [provider] Taking Active Self  aspirin EC 81 MG tablet [id number] Yes Take 81 mg by mouth daily. [provider] Taking Active Self  carvedilol (COREG) 3.125 MG tablet [id number] Yes Take 3.125 mg by mouth in the  morning and at bedtime. [provider] Taking Active Self  cetirizine (ZYRTEC) 10 MG tablet [id number] Yes Take 10 mg by mouth at bedtime. [provider] Taking Active Self  clonazePAM (KLONOPIN) 0.5 MG tablet [id number] Yes Take 0.25 mg by mouth See admin instructions. Take 0.25 mg by mouth at bedtime and an additional 0.25 mg once a day as needed for anxiety [provider] Taking Active Self  hydrochlorothiazide (HYDRODIURIL) 25 MG tablet [id number] Yes Take 25 mg by mouth daily. [provider] Taking Active Self  levothyroxine (SYNTHROID, LEVOTHROID) 137 MCG tablet [id number] Yes Take 137 mcg by mouth daily before breakfast.  [provider] Taking Active Self  lisinopril (ZESTRIL) 40 MG tablet [id number] Yes Take 40 mg by mouth daily. [provider] Taking Active Self  oxyCODONE (OXY IR/ROXICODONE) 5 MG immediate release tablet [id number] Yes Take 0.5-1 tablets (2.5-5 mg total) by mouth every 6 (six) hours as needed for breakthrough pain. Maczis, Elmer Sow, PA-C Taking Active   pantoprazole (PROTONIX) 40 MG tablet [id number] Yes Take 40 mg by mouth daily before breakfast. [provider] Taking Active Self  TUMS 500 MG chewable tablet [id number] Yes Chew 1-2 tablets by mouth daily as needed for indigestion or heartburn. [provider] Taking Active Self          Medication reconciliation / review completed based on most recent discharge summary and EHR medication list. Confirmed patient is taking all newly prescribed medications as instructed (any discrepancies are noted in review section)   Patient / Caregiver is aware of any changes to and / or  any dosage adjustments to medication regimen. Patient/ Caregiver denies questions at this time and reports no barriers to medication adherence.   Updates reviewed  Medication List  STOP taking these medications     atorvastatin 40 MG tablet Commonly known as: LIPITOR     diazepam 5 MG tablet Commonly known as: VALIUM     Home Care and Equipment/Supplies: Were Home Health Services Ordered?: No Any new equipment or medical supplies ordered?: No  Functional Questionnaire: Do you need assistance with bathing/showering or dressing?: No Do you need assistance with meal preparation?: No Do you need assistance with eating?: No Do you have difficulty maintaining continence: No Do you need assistance with getting out of bed/getting out of a chair/moving?: No Do you have difficulty managing or taking your medications?: No  Follow up appointments reviewed: PCP Follow-up appointment confirmed?: Yes Date of PCP follow-up appointment?: 07/11/23 Follow-up Provider: Benita Stabile, MD Specialist Hospital Follow-up appointment confirmed?: Yes Follow-Up Specialty Provider:: Maczis, Hedda Slade, PA-C Follow up.   Specialty: General Surgery Do you need transportation to your follow-up appointment?: No Do you understand care options if your condition(s) worsen?: Yes-patient verbalized understanding  SDOH Interventions Today    Flowsheet Row Most Recent Value  SDOH Interventions   Food Insecurity Interventions Intervention Not Indicated  Housing Interventions Intervention Not Indicated  Transportation Interventions Intervention Not Indicated, Patient Resources (Friends/Family)  Utilities Interventions Intervention Not Indicated      Interventions Today    Flowsheet Row Most Recent Value  Chronic Disease   Chronic disease during today's visit Other  [Gall stones]  General Interventions   General Interventions Discussed/Reviewed General Interventions Discussed, General Interventions Reviewed, Doctor Visits  Doctor Visits Discussed/Reviewed PCP, Specialist, Doctor Visits Reviewed, Doctor Visits Discussed  PCP/Specialist Visits Compliance with follow-up visit  Exercise Interventions   Exercise Discussed/Reviewed Exercise Discussed, Physical Activity  Physical  Activity Discussed/Reviewed Physical Activity Reviewed  Education Interventions   Education Provided Provided Education  Provided Verbal Education On Nutrition, Exercise, Medication, When to see the doctor, Insurance Plans  Mental Health Interventions   Mental Health Discussed/Reviewed Mental Health Discussed  Nutrition Interventions   Nutrition Discussed/Reviewed Nutrition Discussed, Nutrition Reviewed, Decreasing fats, Decreasing salt  Pharmacy Interventions   Pharmacy Dicussed/Reviewed Pharmacy Topics Discussed, Medications and their functions, Medication Adherence  Safety Interventions   Safety Discussed/Reviewed Safety Reviewed          Benefits reviewed  Based on current information and Insurance plan -Reviewed benefits accessible to patient, including details about eligibility options for care and  available value based care options  if any areas of needs were identified.  Reviewed patient/  caregiver's ability to access and / or  ability with navigating the benefits system..Amb Referral made if indicted , refer to orders section of note for details   Reviewed goals for care Patient/ Caregiver  verbalizes understanding of instructions and care plan provided. Patient / Caregiver was encouraged to make informed decisions about their care, actively participate in managing their health condition, and implement lifestyle changes as needed to promote independence and self-management of health care. There were no reported  barriers to care.   TOC program  Patient is at high risk for readmission and/or has history of  high utilization  Discussed VBCI  TOC program and weekly calls to patient to assess condition/status, medication management  and provide support/education as indicated . Patient/ Caregiver voiced understanding and declined enrollment in the 30-day TOC Program.       The patient has been provided with contact information for the care management team and has been advised to  call with any health-related questions or concerns. Follow up as indicated with  Care Team , or sooner should any new problems arise.  Susa Loffler , BSN, RN [facility]   VBCI-Population Health RN Care Manager Direct Dial [phone]  Fax: [phone] Website: [email or web address]

## 2023-06-29 ENCOUNTER — Encounter (HOSPITAL_COMMUNITY): Payer: Self-pay | Admitting: Gastroenterology

## 2023-07-11 DIAGNOSIS — R748 Abnormal levels of other serum enzymes: Secondary | ICD-10-CM | POA: Diagnosis not present

## 2023-07-11 DIAGNOSIS — E785 Hyperlipidemia, unspecified: Secondary | ICD-10-CM | POA: Diagnosis not present

## 2023-07-11 DIAGNOSIS — K819 Cholecystitis, unspecified: Secondary | ICD-10-CM | POA: Diagnosis not present

## 2023-07-11 DIAGNOSIS — R7401 Elevation of levels of liver transaminase levels: Secondary | ICD-10-CM | POA: Diagnosis not present

## 2023-07-31 DIAGNOSIS — Z79899 Other long term (current) drug therapy: Secondary | ICD-10-CM | POA: Diagnosis not present

## 2023-07-31 DIAGNOSIS — Z7182 Exercise counseling: Secondary | ICD-10-CM | POA: Diagnosis not present

## 2023-07-31 DIAGNOSIS — E785 Hyperlipidemia, unspecified: Secondary | ICD-10-CM | POA: Diagnosis not present

## 2023-07-31 DIAGNOSIS — I10 Essential (primary) hypertension: Secondary | ICD-10-CM | POA: Diagnosis not present

## 2023-07-31 DIAGNOSIS — R7303 Prediabetes: Secondary | ICD-10-CM | POA: Diagnosis not present

## 2023-07-31 DIAGNOSIS — E538 Deficiency of other specified B group vitamins: Secondary | ICD-10-CM | POA: Diagnosis not present

## 2023-07-31 DIAGNOSIS — R5383 Other fatigue: Secondary | ICD-10-CM | POA: Diagnosis not present

## 2023-07-31 DIAGNOSIS — Z6830 Body mass index (BMI) 30.0-30.9, adult: Secondary | ICD-10-CM | POA: Diagnosis not present

## 2023-07-31 DIAGNOSIS — F411 Generalized anxiety disorder: Secondary | ICD-10-CM | POA: Diagnosis not present

## 2023-07-31 DIAGNOSIS — E669 Obesity, unspecified: Secondary | ICD-10-CM | POA: Diagnosis not present

## 2023-07-31 DIAGNOSIS — Z713 Dietary counseling and surveillance: Secondary | ICD-10-CM | POA: Diagnosis not present

## 2023-07-31 DIAGNOSIS — E039 Hypothyroidism, unspecified: Secondary | ICD-10-CM | POA: Diagnosis not present

## 2023-07-31 DIAGNOSIS — H9190 Unspecified hearing loss, unspecified ear: Secondary | ICD-10-CM | POA: Diagnosis not present

## 2023-08-02 ENCOUNTER — Telehealth: Payer: Self-pay | Admitting: *Deleted

## 2023-08-02 NOTE — Telephone Encounter (Signed)
 Patient had gallbladder out on 3/4. Has not experienced much diarrhea, however has had a decrease in appetite. She has lost 10 lb. I explained to her that this is likely due to her surgery and it will take time for her body to adjust. Advised that she follow up with her surgeon for recommendations.  Dr. Ellin Saba in agreement.  Verbalized understanding.

## 2023-09-04 ENCOUNTER — Other Ambulatory Visit (HOSPITAL_COMMUNITY): Payer: Self-pay | Admitting: Internal Medicine

## 2023-09-04 DIAGNOSIS — M810 Age-related osteoporosis without current pathological fracture: Secondary | ICD-10-CM

## 2023-09-04 DIAGNOSIS — M180 Bilateral primary osteoarthritis of first carpometacarpal joints: Secondary | ICD-10-CM | POA: Diagnosis not present

## 2023-09-04 DIAGNOSIS — E785 Hyperlipidemia, unspecified: Secondary | ICD-10-CM | POA: Diagnosis not present

## 2023-09-04 DIAGNOSIS — Z853 Personal history of malignant neoplasm of breast: Secondary | ICD-10-CM | POA: Diagnosis not present

## 2023-09-04 DIAGNOSIS — H9190 Unspecified hearing loss, unspecified ear: Secondary | ICD-10-CM | POA: Diagnosis not present

## 2023-09-04 DIAGNOSIS — E538 Deficiency of other specified B group vitamins: Secondary | ICD-10-CM | POA: Diagnosis not present

## 2023-09-04 DIAGNOSIS — I10 Essential (primary) hypertension: Secondary | ICD-10-CM | POA: Diagnosis not present

## 2023-09-04 DIAGNOSIS — E039 Hypothyroidism, unspecified: Secondary | ICD-10-CM | POA: Diagnosis not present

## 2023-09-04 DIAGNOSIS — Z0001 Encounter for general adult medical examination with abnormal findings: Secondary | ICD-10-CM | POA: Diagnosis not present

## 2023-09-04 DIAGNOSIS — R7303 Prediabetes: Secondary | ICD-10-CM | POA: Diagnosis not present

## 2023-09-04 DIAGNOSIS — F411 Generalized anxiety disorder: Secondary | ICD-10-CM | POA: Diagnosis not present

## 2023-09-04 DIAGNOSIS — M25562 Pain in left knee: Secondary | ICD-10-CM | POA: Diagnosis not present

## 2023-09-11 ENCOUNTER — Ambulatory Visit (HOSPITAL_COMMUNITY)
Admission: RE | Admit: 2023-09-11 | Discharge: 2023-09-11 | Disposition: A | Discharge status: Home / Self Care | Source: Ambulatory Visit | Attending: Internal Medicine | Admitting: Internal Medicine

## 2023-09-11 DIAGNOSIS — M810 Age-related osteoporosis without current pathological fracture: Secondary | ICD-10-CM | POA: Insufficient documentation

## 2023-10-10 DIAGNOSIS — M810 Age-related osteoporosis without current pathological fracture: Secondary | ICD-10-CM | POA: Diagnosis not present

## 2023-10-10 DIAGNOSIS — E669 Obesity, unspecified: Secondary | ICD-10-CM | POA: Diagnosis not present

## 2023-10-10 DIAGNOSIS — I10 Essential (primary) hypertension: Secondary | ICD-10-CM | POA: Diagnosis not present

## 2023-10-10 DIAGNOSIS — E039 Hypothyroidism, unspecified: Secondary | ICD-10-CM | POA: Diagnosis not present

## 2023-10-11 ENCOUNTER — Other Ambulatory Visit: Payer: Self-pay | Admitting: Internal Medicine

## 2023-10-11 DIAGNOSIS — M810 Age-related osteoporosis without current pathological fracture: Secondary | ICD-10-CM | POA: Insufficient documentation

## 2023-10-16 ENCOUNTER — Telehealth: Payer: Self-pay

## 2023-10-16 NOTE — Telephone Encounter (Signed)
 Auth Submission: APPROVED Site of care: Site of care: [facility] Payer: [employer] medicare Medication & CPT/J Code(s) submitted: Prolia (Denosumab) [id number] Diagnosis Code:  Route of submission (phone, fax, portal): portal Phone # Fax # Auth type: Buy/Bill PB Units/visits requested: 60mg , q62months x 2doses Reference number: [id number] Approval from: 10/16/23 to 04/23/24    Prolia and administration will be subject to 20.0% coinsurance up to a $4,150.00  out of pocket max ($1,079.67 met). Once met, coverage increases to 100%. No deductible applies

## 2023-10-22 ENCOUNTER — Encounter: Attending: Internal Medicine | Admitting: Internal Medicine

## 2023-10-22 VITALS — BP 140/76 | Temp 98.1°F | Resp 16

## 2023-10-22 DIAGNOSIS — M810 Age-related osteoporosis without current pathological fracture: Secondary | ICD-10-CM | POA: Diagnosis not present

## 2023-10-22 MED ORDER — DENOSUMAB 60 MG/ML ~~LOC~~ SOSY
60.0000 mg | PREFILLED_SYRINGE | Freq: Once | SUBCUTANEOUS | Status: AC
  Administered 2023-10-22: 60 mg via SUBCUTANEOUS

## 2023-10-22 NOTE — Progress Notes (Signed)
 Diagnosis: Osteoporosis  Provider:  Shona Salvo MD  Procedure: Injection  Prolia (Denosumab), Dose: 60 mg, Site: subcutaneous, Number of injections: 1  Injection Site(s): Left arm  Post Care: Observation period completed  Discharge: Condition: Good, Destination: Home . AVS Provided  Performed by:  Blanca Selinda SAUNDERS, LPN

## 2023-12-12 DIAGNOSIS — R7303 Prediabetes: Secondary | ICD-10-CM | POA: Diagnosis not present

## 2023-12-12 DIAGNOSIS — M810 Age-related osteoporosis without current pathological fracture: Secondary | ICD-10-CM | POA: Diagnosis not present

## 2023-12-12 DIAGNOSIS — E785 Hyperlipidemia, unspecified: Secondary | ICD-10-CM | POA: Diagnosis not present

## 2023-12-12 DIAGNOSIS — E039 Hypothyroidism, unspecified: Secondary | ICD-10-CM | POA: Diagnosis not present

## 2023-12-12 DIAGNOSIS — E538 Deficiency of other specified B group vitamins: Secondary | ICD-10-CM | POA: Diagnosis not present

## 2023-12-18 DIAGNOSIS — E538 Deficiency of other specified B group vitamins: Secondary | ICD-10-CM | POA: Diagnosis not present

## 2023-12-18 DIAGNOSIS — K222 Esophageal obstruction: Secondary | ICD-10-CM | POA: Diagnosis not present

## 2023-12-18 DIAGNOSIS — H9193 Unspecified hearing loss, bilateral: Secondary | ICD-10-CM | POA: Diagnosis not present

## 2023-12-18 DIAGNOSIS — R911 Solitary pulmonary nodule: Secondary | ICD-10-CM | POA: Diagnosis not present

## 2023-12-18 DIAGNOSIS — I10 Essential (primary) hypertension: Secondary | ICD-10-CM | POA: Diagnosis not present

## 2023-12-18 DIAGNOSIS — M25562 Pain in left knee: Secondary | ICD-10-CM | POA: Diagnosis not present

## 2023-12-18 DIAGNOSIS — E039 Hypothyroidism, unspecified: Secondary | ICD-10-CM | POA: Diagnosis not present

## 2023-12-18 DIAGNOSIS — M810 Age-related osteoporosis without current pathological fracture: Secondary | ICD-10-CM | POA: Diagnosis not present

## 2023-12-18 DIAGNOSIS — E785 Hyperlipidemia, unspecified: Secondary | ICD-10-CM | POA: Diagnosis not present

## 2023-12-18 DIAGNOSIS — F411 Generalized anxiety disorder: Secondary | ICD-10-CM | POA: Diagnosis not present

## 2023-12-18 DIAGNOSIS — R7303 Prediabetes: Secondary | ICD-10-CM | POA: Diagnosis not present

## 2023-12-18 DIAGNOSIS — M21372 Foot drop, left foot: Secondary | ICD-10-CM | POA: Diagnosis not present

## 2024-01-17 DIAGNOSIS — H353221 Exudative age-related macular degeneration, left eye, with active choroidal neovascularization: Secondary | ICD-10-CM | POA: Diagnosis not present

## 2024-01-23 DIAGNOSIS — H353111 Nonexudative age-related macular degeneration, right eye, early dry stage: Secondary | ICD-10-CM | POA: Diagnosis not present

## 2024-01-23 DIAGNOSIS — H353221 Exudative age-related macular degeneration, left eye, with active choroidal neovascularization: Secondary | ICD-10-CM | POA: Diagnosis not present

## 2024-01-23 DIAGNOSIS — H43813 Vitreous degeneration, bilateral: Secondary | ICD-10-CM | POA: Diagnosis not present

## 2024-01-29 DIAGNOSIS — L814 Other melanin hyperpigmentation: Secondary | ICD-10-CM | POA: Diagnosis not present

## 2024-01-29 DIAGNOSIS — L570 Actinic keratosis: Secondary | ICD-10-CM | POA: Diagnosis not present

## 2024-01-29 DIAGNOSIS — H61022 Chronic perichondritis of left external ear: Secondary | ICD-10-CM | POA: Diagnosis not present

## 2024-01-29 DIAGNOSIS — D040 Carcinoma in situ of skin of lip: Secondary | ICD-10-CM | POA: Diagnosis not present

## 2024-01-29 DIAGNOSIS — H353221 Exudative age-related macular degeneration, left eye, with active choroidal neovascularization: Secondary | ICD-10-CM | POA: Diagnosis not present

## 2024-01-29 DIAGNOSIS — D692 Other nonthrombocytopenic purpura: Secondary | ICD-10-CM | POA: Diagnosis not present

## 2024-01-29 DIAGNOSIS — L821 Other seborrheic keratosis: Secondary | ICD-10-CM | POA: Diagnosis not present

## 2024-01-29 DIAGNOSIS — D224 Melanocytic nevi of scalp and neck: Secondary | ICD-10-CM | POA: Diagnosis not present

## 2024-01-29 DIAGNOSIS — D485 Neoplasm of uncertain behavior of skin: Secondary | ICD-10-CM | POA: Diagnosis not present

## 2024-01-29 DIAGNOSIS — D1801 Hemangioma of skin and subcutaneous tissue: Secondary | ICD-10-CM | POA: Diagnosis not present

## 2024-01-29 DIAGNOSIS — Z85828 Personal history of other malignant neoplasm of skin: Secondary | ICD-10-CM | POA: Diagnosis not present

## 2024-01-29 DIAGNOSIS — C44629 Squamous cell carcinoma of skin of left upper limb, including shoulder: Secondary | ICD-10-CM | POA: Diagnosis not present

## 2024-02-09 DIAGNOSIS — K047 Periapical abscess without sinus: Secondary | ICD-10-CM | POA: Diagnosis not present

## 2024-02-09 DIAGNOSIS — K122 Cellulitis and abscess of mouth: Secondary | ICD-10-CM | POA: Diagnosis not present

## 2024-02-12 DIAGNOSIS — C4402 Squamous cell carcinoma of skin of lip: Secondary | ICD-10-CM | POA: Diagnosis not present

## 2024-02-12 IMAGING — CT CT CHEST W/O CM
2 of 4 series · 15 of 36 positions shown, 18 images · non-contrast
Comparison: 10/27/2016.

CLINICAL DATA: Abnormal CT in 1440.



[Series 2: routine chest without · axial · non-contrast · 0.79mm/px · z∈[+1808,+2108]mm · 12 of 178 slices shown, 15 images]
[im 14/178  mediastinal]
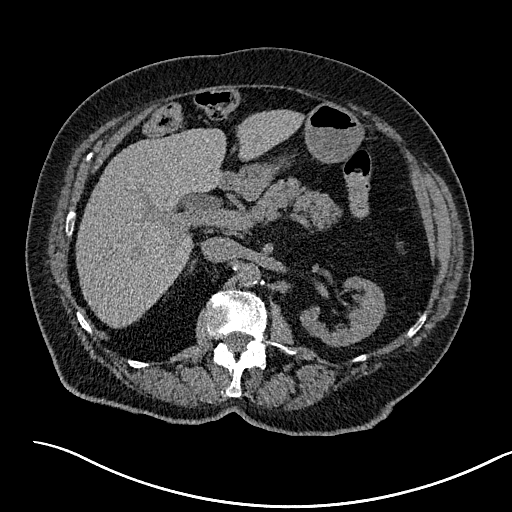
[im 14/178  lung]
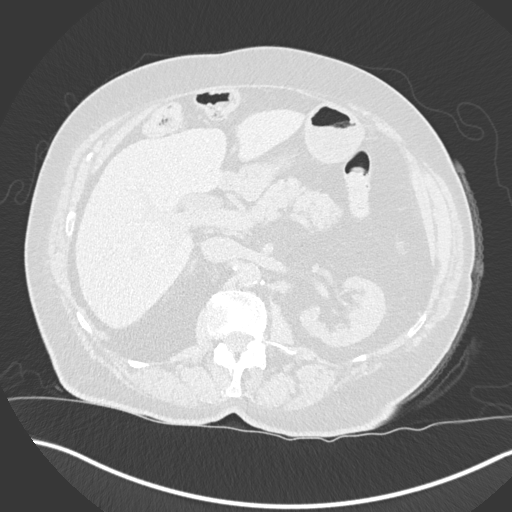
[im 28/178  lung]
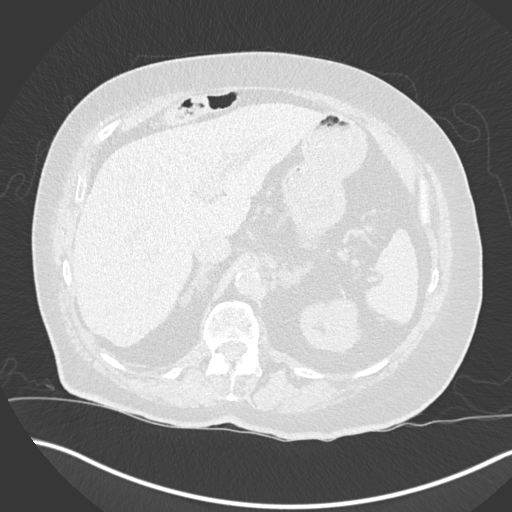
[im 41/178  lung]
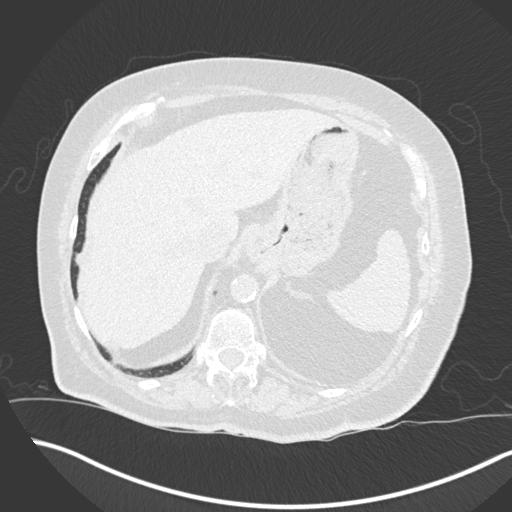
[im 55/178  lung]
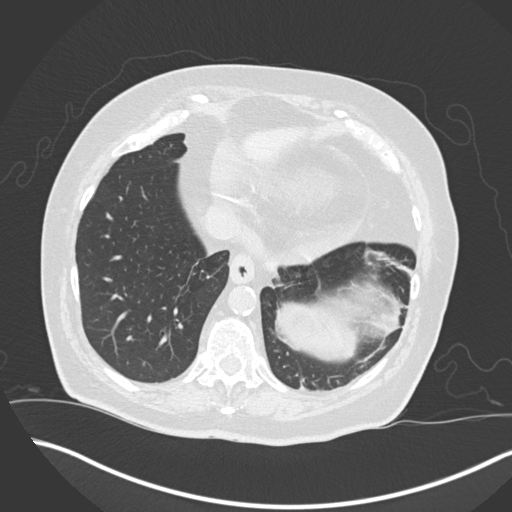
[im 69/178  mediastinal]
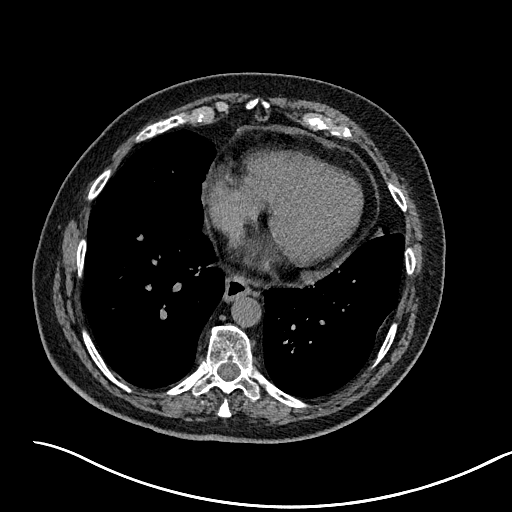
[im 69/178  lung]
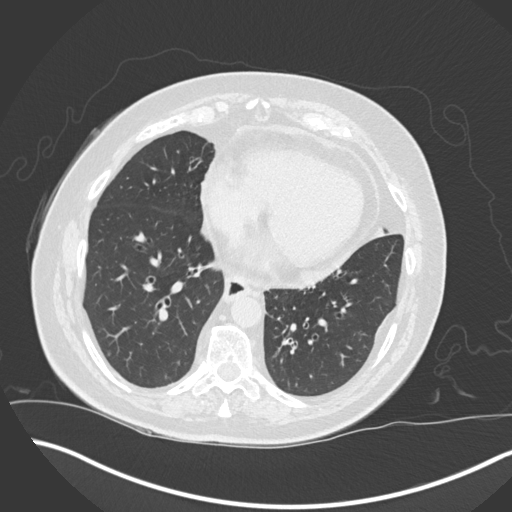
[im 82/178  lung]
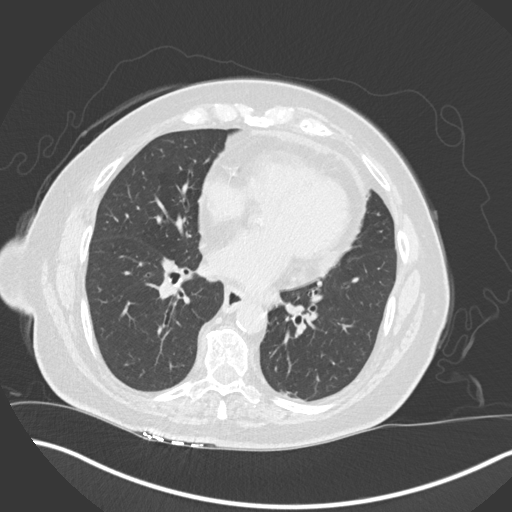
[im 96/178  lung]
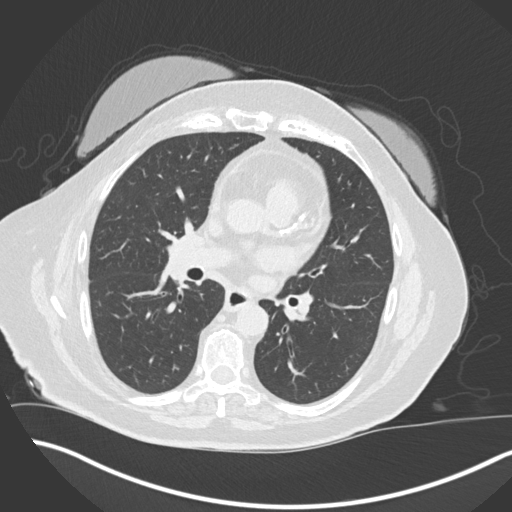
[im 109/178  lung]
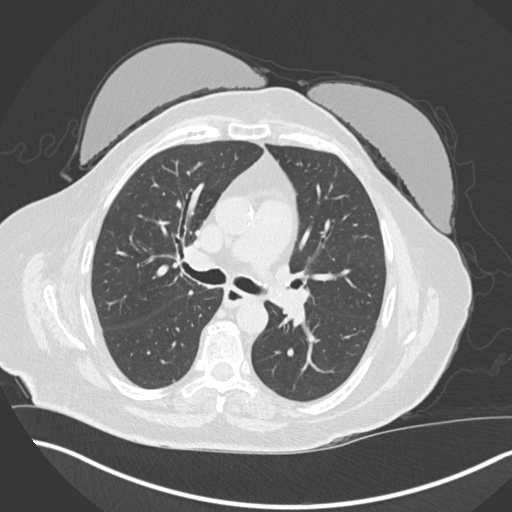
[im 123/178  mediastinal]
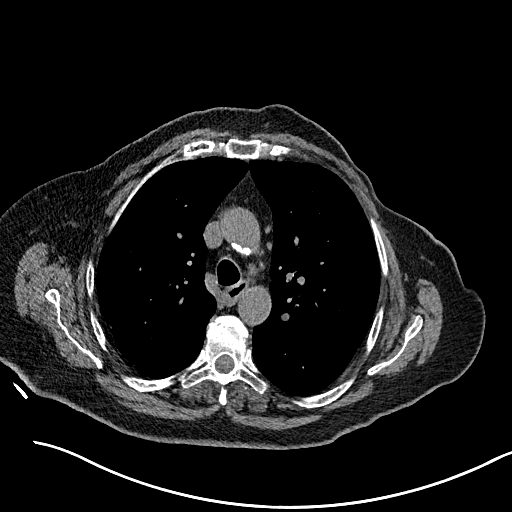
[im 123/178  lung]
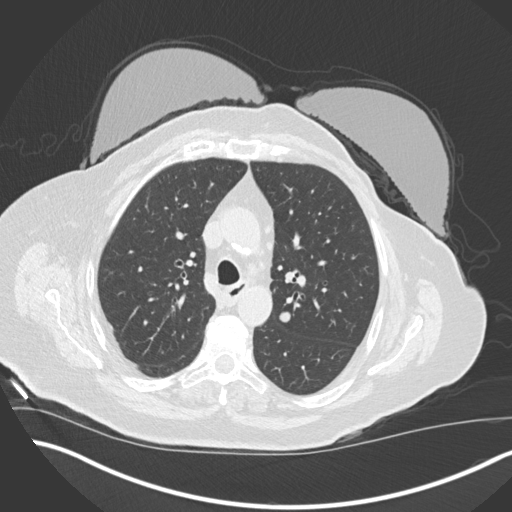
[im 137/178  lung]
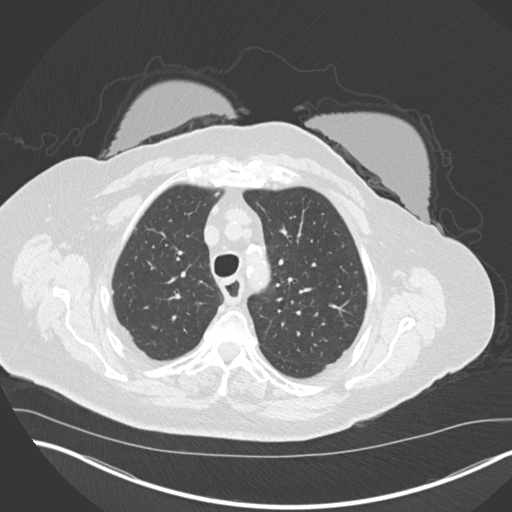
[im 150/178  lung]
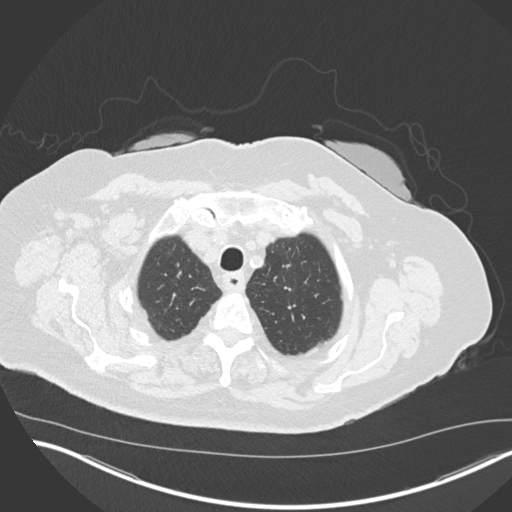
[im 164/178  lung]
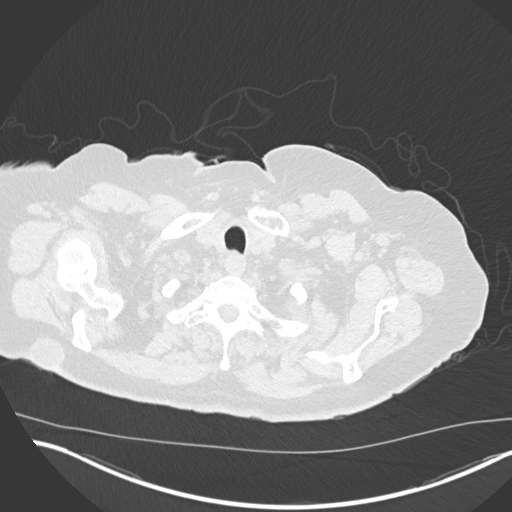

[Series 5: coronal · coronal · 0.71mm/px · 3 of 165 slices shown]
[im 33/165  lung]
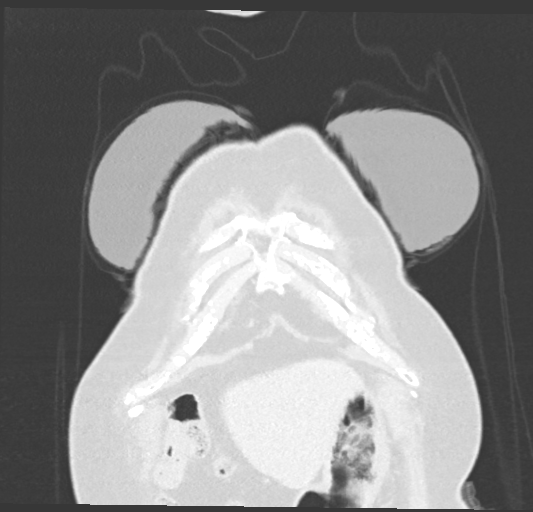
[im 66/165  lung]
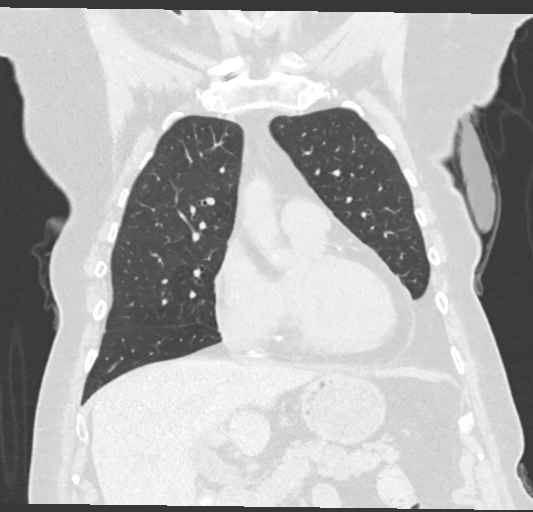
[im 99/165  lung]
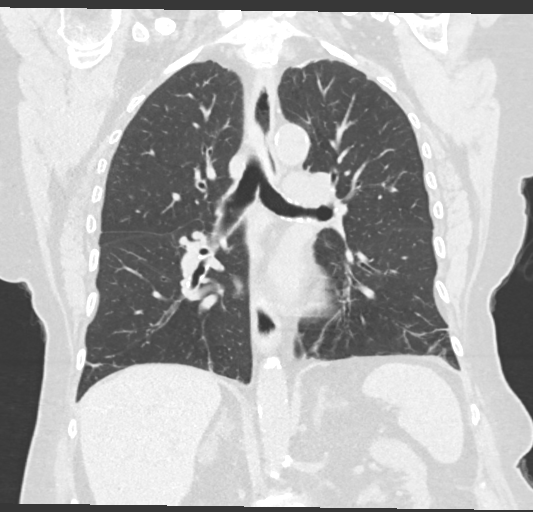

[15 of 36 positions shown; findings below may reference images not displayed]

FINDINGS: Cardiovascular: Atherosclerotic calcification of the aorta, aortic
valve and coronary arteries. Enlarged pulmonic trunk and heart.
Small pericardial effusion appears similar.

Mediastinum/Nodes: Mediastinal lymph nodes are not enlarged by CT
size criteria. Hilar regions are difficult to definitively evaluate
without IV contrast. No axillary adenopathy. Esophagus is mildly
dilated throughout its course, which can be seen with dysmotility.

Lungs/Pleura: Biapical pleuroparenchymal scarring. Nodular
ground-glass lesion in the posterior aspect of the right upper lobe
measures 1.7 x 2.9 x 2.4 cm, enlarged slightly from 1.8 x 2.6 x
cm on 10/27/2016. Possible developing linear nodular component (4/47
and 5/125). Well-circumscribed 10 mm nodule in the posterior left
upper lobe (4/57), stable and benign. Bibasilar pleuroparenchymal
scarring. No pleural fluid. Airway is unremarkable.

Upper Abdomen: Visualized portion of the liver is unremarkable.
Stones are partially imaged in the gallbladder. Common bile duct
measures up to 13 mm. Mild thickening of the adrenal glands. No
follow-up necessary. 1.4 cm fluid density lesion in the interpolar
left kidney, indicative of a cyst. No follow-up necessary.
Visualized portions of the left kidney, spleen, pancreas, stomach
and bowel are otherwise unremarkable with exception of a small
hiatal hernia. Two peripherally calcified splenic artery aneurysms
along the pancreatic tail, measuring up to 10 mm. No upper abdominal
adenopathy.

Musculoskeletal: Degenerative changes in the spine. No worrisome
lytic or sclerotic lesions.
IMPRESSION: 1. Enlarging ground-glass nodule in the right upper lobe, with a
possible developing linear a solid component. Findings are worrisome
for indolent adenocarcinoma. Consider CT chest without contrast in
3-6 months in further evaluation, as clinically indicated. If a more
aggressive approach is desired, PET could be performed with the
caveat that indolent adenocarcinoma can be falsely hypometabolic.
2. Similar small pericardial effusion.
3. Cholelithiasis. Common bile duct dilatation. Please correlate
clinically for right upper quadrant symptoms and consider right
upper quadrant ultrasound, as clinically indicated.
4. Two peripherally calcified splenic artery aneurysms.
5. Aortic atherosclerosis ([id number]). Coronary artery
calcification.
6. Enlarged pulmonic trunk, indicative of retention.

## 2024-02-14 DIAGNOSIS — Z008 Encounter for other general examination: Secondary | ICD-10-CM | POA: Diagnosis not present

## 2024-02-27 DIAGNOSIS — H353111 Nonexudative age-related macular degeneration, right eye, early dry stage: Secondary | ICD-10-CM | POA: Diagnosis not present

## 2024-02-27 DIAGNOSIS — H353221 Exudative age-related macular degeneration, left eye, with active choroidal neovascularization: Secondary | ICD-10-CM | POA: Diagnosis not present

## 2024-02-27 DIAGNOSIS — H43813 Vitreous degeneration, bilateral: Secondary | ICD-10-CM | POA: Diagnosis not present

## 2024-02-28 ENCOUNTER — Inpatient Hospital Stay (HOSPITAL_COMMUNITY)
Admission: RE | Admit: 2024-02-28 | Discharge: 2024-02-28 | Disposition: A | Discharge status: Home / Self Care | Payer: Medicare HMO | Source: Ambulatory Visit | Attending: Hematology | Admitting: Hematology

## 2024-02-28 ENCOUNTER — Inpatient Hospital Stay: Payer: Medicare HMO | Attending: Oncology

## 2024-02-28 DIAGNOSIS — J4489 Other specified chronic obstructive pulmonary disease: Secondary | ICD-10-CM | POA: Diagnosis not present

## 2024-02-28 DIAGNOSIS — R911 Solitary pulmonary nodule: Secondary | ICD-10-CM | POA: Insufficient documentation

## 2024-02-28 DIAGNOSIS — I700 Atherosclerosis of aorta: Secondary | ICD-10-CM | POA: Diagnosis not present

## 2024-02-28 DIAGNOSIS — Z808 Family history of malignant neoplasm of other organs or systems: Secondary | ICD-10-CM | POA: Insufficient documentation

## 2024-02-28 DIAGNOSIS — M7989 Other specified soft tissue disorders: Secondary | ICD-10-CM | POA: Diagnosis not present

## 2024-02-28 DIAGNOSIS — R918 Other nonspecific abnormal finding of lung field: Secondary | ICD-10-CM | POA: Diagnosis not present

## 2024-02-28 DIAGNOSIS — F17210 Nicotine dependence, cigarettes, uncomplicated: Secondary | ICD-10-CM | POA: Insufficient documentation

## 2024-02-28 DIAGNOSIS — M4854XA Collapsed vertebra, not elsewhere classified, thoracic region, initial encounter for fracture: Secondary | ICD-10-CM | POA: Insufficient documentation

## 2024-02-28 DIAGNOSIS — Z860100 Personal history of colon polyps, unspecified: Secondary | ICD-10-CM | POA: Insufficient documentation

## 2024-02-28 DIAGNOSIS — Z803 Family history of malignant neoplasm of breast: Secondary | ICD-10-CM | POA: Insufficient documentation

## 2024-02-28 DIAGNOSIS — R0902 Hypoxemia: Secondary | ICD-10-CM | POA: Diagnosis not present

## 2024-02-28 DIAGNOSIS — Z853 Personal history of malignant neoplasm of breast: Secondary | ICD-10-CM | POA: Insufficient documentation

## 2024-02-28 LAB — COMPREHENSIVE METABOLIC PANEL WITH GFR
ALT: 12 U/L (ref 0–44)
AST: 17 U/L (ref 15–41)
Albumin: 4 g/dL (ref 3.5–5.0)
Alkaline Phosphatase: 85 U/L (ref 38–126)
Anion gap: 11 (ref 5–15)
BUN: 14 mg/dL (ref 8–23)
CO2: 28 mmol/L (ref 22–32)
Calcium: 9.1 mg/dL (ref 8.9–10.3)
Chloride: 102 mmol/L (ref 98–111)
Creatinine, Ser: 0.7 mg/dL (ref 0.44–1.00)
GFR, Estimated: >60 mL/min (ref >60)
Glucose, Bld: 130 mg/dL — ABNORMAL HIGH (ref 70–99)
Potassium: 4 mmol/L (ref 3.5–5.1)
Sodium: 142 mmol/L (ref 135–145)
Total Bilirubin: 0.4 mg/dL (ref 0.0–1.2)
Total Protein: 6.4 g/dL — ABNORMAL LOW (ref 6.5–8.1)

## 2024-02-28 LAB — CBC WITH DIFFERENTIAL/PLATELET
Abs Immature Granulocytes: 0.02 K/uL (ref 0.00–0.07)
Basophils Absolute: 0.1 K/uL (ref 0.0–0.1)
Basophils Relative: 1 %
Eosinophils Absolute: 0.4 K/uL (ref 0.0–0.5)
Eosinophils Relative: 5 %
HCT: 38.4 % (ref 36.0–46.0)
Hemoglobin: 12.3 g/dL (ref 12.0–15.0)
Immature Granulocytes: 0 %
Lymphocytes Relative: 35 %
Lymphs Abs: 3.2 K/uL (ref 0.7–4.0)
MCH: 29.6 pg (ref 26.0–34.0)
MCHC: 32 g/dL (ref 30.0–36.0)
MCV: 92.3 fL (ref 80.0–100.0)
Monocytes Absolute: 0.7 K/uL (ref 0.1–1.0)
Monocytes Relative: 8 %
Neutro Abs: 4.6 K/uL (ref 1.7–7.7)
Neutrophils Relative %: 51 %
Platelets: 275 K/uL (ref 150–400)
RBC: 4.16 MIL/uL (ref 3.87–5.11)
RDW: 13.6 % (ref 11.5–15.5)
WBC: 9.1 K/uL (ref 4.0–10.5)
nRBC: 0 % (ref 0.0–0.2)

## 2024-02-28 MED ORDER — IOHEXOL 300 MG/ML  SOLN
80.0000 mL | Freq: Once | INTRAMUSCULAR | Status: AC | PRN
  Administered 2024-02-28: 80 mL via INTRAVENOUS

## 2024-03-01 ENCOUNTER — Other Ambulatory Visit: Payer: Self-pay

## 2024-03-01 ENCOUNTER — Emergency Department (HOSPITAL_COMMUNITY)

## 2024-03-01 ENCOUNTER — Inpatient Hospital Stay (HOSPITAL_COMMUNITY)
Admission: EM | Admit: 2024-03-01 | Discharge: 2024-03-05 | DRG: 192 | Disposition: A | Discharge status: Skilled Nursing Facility | Attending: Emergency Medicine | Admitting: Emergency Medicine

## 2024-03-01 ENCOUNTER — Encounter (HOSPITAL_COMMUNITY): Payer: Self-pay | Admitting: *Deleted

## 2024-03-01 DIAGNOSIS — E785 Hyperlipidemia, unspecified: Secondary | ICD-10-CM | POA: Diagnosis present

## 2024-03-01 DIAGNOSIS — E039 Hypothyroidism, unspecified: Secondary | ICD-10-CM | POA: Diagnosis present

## 2024-03-01 DIAGNOSIS — J449 Chronic obstructive pulmonary disease, unspecified: Secondary | ICD-10-CM | POA: Insufficient documentation

## 2024-03-01 DIAGNOSIS — M25562 Pain in left knee: Principal | ICD-10-CM

## 2024-03-01 DIAGNOSIS — R7303 Prediabetes: Secondary | ICD-10-CM | POA: Diagnosis present

## 2024-03-01 DIAGNOSIS — J302 Other seasonal allergic rhinitis: Secondary | ICD-10-CM | POA: Diagnosis present

## 2024-03-01 DIAGNOSIS — M85862 Other specified disorders of bone density and structure, left lower leg: Secondary | ICD-10-CM | POA: Diagnosis not present

## 2024-03-01 DIAGNOSIS — Z23 Encounter for immunization: Secondary | ICD-10-CM

## 2024-03-01 DIAGNOSIS — M1712 Unilateral primary osteoarthritis, left knee: Secondary | ICD-10-CM | POA: Diagnosis not present

## 2024-03-01 DIAGNOSIS — J4489 Other specified chronic obstructive pulmonary disease: Principal | ICD-10-CM | POA: Diagnosis present

## 2024-03-01 DIAGNOSIS — F411 Generalized anxiety disorder: Secondary | ICD-10-CM | POA: Diagnosis present

## 2024-03-01 DIAGNOSIS — Z716 Tobacco abuse counseling: Secondary | ICD-10-CM

## 2024-03-01 DIAGNOSIS — R911 Solitary pulmonary nodule: Secondary | ICD-10-CM | POA: Diagnosis present

## 2024-03-01 DIAGNOSIS — Z7982 Long term (current) use of aspirin: Secondary | ICD-10-CM

## 2024-03-01 DIAGNOSIS — Z853 Personal history of malignant neoplasm of breast: Secondary | ICD-10-CM

## 2024-03-01 DIAGNOSIS — Z789 Other specified health status: Secondary | ICD-10-CM

## 2024-03-01 DIAGNOSIS — Z79899 Other long term (current) drug therapy: Secondary | ICD-10-CM

## 2024-03-01 DIAGNOSIS — Z79890 Hormone replacement therapy (postmenopausal): Secondary | ICD-10-CM

## 2024-03-01 DIAGNOSIS — I110 Hypertensive heart disease with heart failure: Secondary | ICD-10-CM | POA: Diagnosis present

## 2024-03-01 DIAGNOSIS — M79605 Pain in left leg: Secondary | ICD-10-CM | POA: Diagnosis not present

## 2024-03-01 DIAGNOSIS — F17210 Nicotine dependence, cigarettes, uncomplicated: Secondary | ICD-10-CM | POA: Diagnosis present

## 2024-03-01 DIAGNOSIS — I2510 Atherosclerotic heart disease of native coronary artery without angina pectoris: Secondary | ICD-10-CM | POA: Diagnosis present

## 2024-03-01 DIAGNOSIS — Z9013 Acquired absence of bilateral breasts and nipples: Secondary | ICD-10-CM

## 2024-03-01 DIAGNOSIS — J441 Chronic obstructive pulmonary disease with (acute) exacerbation: Secondary | ICD-10-CM | POA: Insufficient documentation

## 2024-03-01 DIAGNOSIS — R0902 Hypoxemia: Secondary | ICD-10-CM | POA: Diagnosis present

## 2024-03-01 DIAGNOSIS — M7989 Other specified soft tissue disorders: Secondary | ICD-10-CM | POA: Diagnosis not present

## 2024-03-01 DIAGNOSIS — K219 Gastro-esophageal reflux disease without esophagitis: Secondary | ICD-10-CM | POA: Diagnosis present

## 2024-03-01 DIAGNOSIS — Z923 Personal history of irradiation: Secondary | ICD-10-CM

## 2024-03-01 LAB — BASIC METABOLIC PANEL WITH GFR
Anion gap: 15 (ref 5–15)
BUN: 12 mg/dL (ref 8–23)
CO2: 24 mmol/L (ref 22–32)
Calcium: 9 mg/dL (ref 8.9–10.3)
Chloride: 98 mmol/L (ref 98–111)
Creatinine, Ser: 0.49 mg/dL (ref 0.44–1.00)
GFR, Estimated: >60 mL/min (ref >60)
Glucose, Bld: 102 mg/dL — ABNORMAL HIGH (ref 70–99)
Potassium: 3.2 mmol/L — ABNORMAL LOW (ref 3.5–5.1)
Sodium: 137 mmol/L (ref 135–145)

## 2024-03-01 LAB — CBC WITH DIFFERENTIAL/PLATELET
Abs Immature Granulocytes: 0.04 K/uL (ref 0.00–0.07)
Basophils Absolute: 0.1 K/uL (ref 0.0–0.1)
Basophils Relative: 1 %
Eosinophils Absolute: 0.3 K/uL (ref 0.0–0.5)
Eosinophils Relative: 2 %
HCT: 38.3 % (ref 36.0–46.0)
Hemoglobin: 12.7 g/dL (ref 12.0–15.0)
Immature Granulocytes: 0 %
Lymphocytes Relative: 24 %
Lymphs Abs: 2.6 K/uL (ref 0.7–4.0)
MCH: 30.5 pg (ref 26.0–34.0)
MCHC: 33.2 g/dL (ref 30.0–36.0)
MCV: 91.8 fL (ref 80.0–100.0)
Monocytes Absolute: 1 K/uL (ref 0.1–1.0)
Monocytes Relative: 10 %
Neutro Abs: 6.6 K/uL (ref 1.7–7.7)
Neutrophils Relative %: 63 %
Platelets: 227 K/uL (ref 150–400)
RBC: 4.17 MIL/uL (ref 3.87–5.11)
RDW: 14.1 % (ref 11.5–15.5)
WBC: 10.7 K/uL — ABNORMAL HIGH (ref 4.0–10.5)
nRBC: 0 % (ref 0.0–0.2)

## 2024-03-01 LAB — D-DIMER, QUANTITATIVE: D-Dimer, Quant: 6.25 ug{FEU}/mL — ABNORMAL HIGH (ref 0.00–0.50)

## 2024-03-01 MED ORDER — ACETAMINOPHEN 325 MG PO TABS
650.0000 mg | ORAL_TABLET | Freq: Once | ORAL | Status: AC
  Administered 2024-03-01: 650 mg via ORAL
  Filled 2024-03-01: qty 2

## 2024-03-01 MED ORDER — TRAMADOL HCL 50 MG PO TABS
50.0000 mg | ORAL_TABLET | Freq: Once | ORAL | Status: AC
  Administered 2024-03-01: 50 mg via ORAL
  Filled 2024-03-01: qty 1

## 2024-03-01 MED ORDER — ACETAMINOPHEN 325 MG PO TABS
650.0000 mg | ORAL_TABLET | ORAL | Status: DC | PRN
  Administered 2024-03-02 – 2024-03-05 (×4): 650 mg via ORAL
  Filled 2024-03-01 (×5): qty 2

## 2024-03-01 MED ORDER — NAPROXEN 250 MG PO TABS
250.0000 mg | ORAL_TABLET | Freq: Once | ORAL | Status: AC
  Administered 2024-03-01: 250 mg via ORAL
  Filled 2024-03-01: qty 1

## 2024-03-01 NOTE — Discharge Instructions (Addendum)
 We are so glad you are feeling better!  You were admitted for needing supplemental oxygen that she did not need prior.  On discharge you are doing well enough to eat we do not believe you need oxygen to go home on.  You will be going to a skilled nursing facility first.  Please quit smoking.  Quitting smoking can be very challenging. Do not get discouraged if you are not successful the first time. Some people need to make many attempts to quit before they achieve long-term success. When you decide to quit smoking, create a plan to help you succeed. Quit smoking right away, not slowly over a period of time. Find resources and support systems that can help you quit smoking and remain smoke-free after you quit. Return to the ED for persistent or increased shortness of breath, chest pain, increased leg pain, or any other complaints that you feel need to be evaluated in an emergency room.

## 2024-03-01 NOTE — ED Triage Notes (Signed)
 Pt reports she woke up this morning with pain in her left leg that has progressively increased throughout the day. Unable to bear weight on it, family concerned due to her living alone.

## 2024-03-01 NOTE — ED Triage Notes (Signed)
 She had tylenol  around 1500

## 2024-03-01 NOTE — ED Provider Notes (Signed)
 [place] EMERGENCY DEPARTMENT AT [facility], [facility], [facility] Provider Note   CSN: [id number] Arrival date & time: 03/01/24  1731     Patient presents with: No chief complaint on file.   [name] is a 83 y.o. female.   Patient is a 83 year old female who presents with pain in her left leg.  She had a prior remote injury to her left leg.  She said it started with a tib-fib fracture after she stepped in a hole when she got stung by bee many years ago.  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  She was ultimately seen at [facility] and required multiple surgeries for debridement and muscle and skin grafting.  She says she has some mild ongoing pain but typically does not have a lot of pain in the leg and is fairly mobile.  She said she woke up this morning with increased pain in her left leg.  She walked to the bathroom during the night and did okay.  However during the day it has been hurting worse.  It is focused mostly in the left knee but radiates up to her thigh.  She feels like it is a little bit more swollen than normal.  She denies any known fevers.  No recent injuries.  No associated back pain.  No significant hip pain although she said at 1 point it hurt in her hip when she turned her leg outward.         Prior to Admission medications   Medication Sig Start Date End Date Taking? Authorizing Provider  acetaminophen  (TYLENOL ) 500 MG tablet Take 1,000 mg by mouth daily as needed (for pain).    [provider]  amLODipine (NORVASC) 5 MG tablet Take 5 mg by mouth daily.    [provider]  aspirin EC 81 MG tablet Take 81 mg by mouth daily.    [provider]  carvedilol (COREG) 3.125 MG tablet Take 3.125 mg by mouth in the morning and at bedtime.    [provider]  cetirizine (ZYRTEC) 10 MG tablet Take 10 mg by mouth at bedtime.    [provider]  clonazePAM  (KLONOPIN ) 0.5 MG tablet Take 0.25 mg by mouth See admin  instructions. Take 0.25 mg by mouth at bedtime and an additional 0.25 mg once a day as needed for anxiety    [provider]  hydrochlorothiazide (HYDRODIURIL) 25 MG tablet Take 25 mg by mouth daily.    [provider]  levothyroxine  (SYNTHROID , LEVOTHROID) 137 MCG tablet Take 137 mcg by mouth daily before breakfast.  09/19/17   [provider]  lisinopril (ZESTRIL) 40 MG tablet Take 40 mg by mouth daily.    [provider]  oxyCODONE  (OXY IR/ROXICODONE ) 5 MG immediate release tablet Take 0.5-1 tablets (2.5-5 mg total) by mouth every 6 (six) hours as needed for breakthrough pain. 06/27/23   Maczis, Michael M, PA-C  pantoprazole  (PROTONIX ) 40 MG tablet Take 40 mg by mouth daily before breakfast.    [provider]  TUMS 500 MG chewable tablet Chew 1-2 tablets by mouth daily as needed for indigestion or heartburn.    [provider]    Allergies: Sulfa antibiotics    Review of Systems  Constitutional:  Negative for fever.  Gastrointestinal:  Negative for nausea and vomiting.  Musculoskeletal:  Positive for arthralgias and joint swelling. Negative for back pain and neck pain.  Skin:  Negative for wound.  Neurological:  Negative for weakness, numbness and headaches.    Updated Vital Signs BP (!) 163/71 (BP Location: Left Arm)   Pulse 84   Temp 98.1 F (36.7 C) (Oral)   Resp 18   Ht 5' 3 (1.6 m)   Wt 81.6 kg   SpO2 96%   BMI 31.87 kg/m   Physical Exam Constitutional:      Appearance: She is well-developed.  HENT:     Head: Normocephalic and atraumatic.  Cardiovascular:     Rate and Rhythm: Normal rate.  Pulmonary:     Effort: Pulmonary effort is normal.  Musculoskeletal:        General: Tenderness present.     Cervical back: Normal range of motion and neck supple.     Comments: Positive scarring from old injury to her left knee and lower leg.  She has some mild puffiness around the knee.  No warmth or erythema.  She has  generalized tenderness on palpation of the left knee.  No significant pain on range of motion of the hip or ankle.  Pedal pulses intact.  She has normal sensation and motor function distally.  No pain to the posterior hip or back.  Skin:    General: Skin is warm and dry.  Neurological:     Mental Status: She is alert and oriented to person, place, and time.     (all labs ordered are listed, but only abnormal results are displayed) Labs Reviewed  CBC WITH DIFFERENTIAL/PLATELET - Abnormal; Notable for the following components:      Result Value   WBC 10.7 (*)    All other components within normal limits  BASIC METABOLIC PANEL WITH GFR  D-DIMER, QUANTITATIVE  D-DIMER, QUANTITATIVE (NOT AT [facility])    EKG: None  Radiology: DG Knee Complete 4 Views Left Result Date: 03/01/2024 EXAM: 4 VIEW(S) XRAY OF THE LEFT KNEE 03/01/2024 07:02:00 PM COMPARISON: None available. CLINICAL HISTORY: leg pain FINDINGS: BONES AND JOINTS: The bones are diffusely osteopenic. There is a lucent lesion with sclerotic border in the proximal tibial metadiaphyseal region laterally measuring 3.5 x 6.7 x 5.0 cm. There is moderate-to-severe tricompartmental osteoarthrosis of the knee with joint space narrowing and osteophyte formation. No acute fracture. No joint dislocation. No significant joint effusion. SOFT TISSUES: There are soft tissue calcifications and surgical clips adjacent to the proximal tibia. IMPRESSION: 1. Moderate-to-severe tricompartmental osteoarthrosis of the left knee with joint space narrowing and osteophyte formation. 2. Indeterminate lucent lesion with sclerotic border in the proximal tibial metadiaphyseal region laterally measuring 3.5 x 6.7 x 5.0 cm. 3. Soft tissue calcifications and surgical clips adjacent to the proximal tibia. Electronically signed by: Susan Pique MD 03/01/2024 07:06 PM EST RP Workstation: [id number]     Procedures   Medications Ordered in the ED  naproxen (NAPROSYN) tablet 250  mg (has no administration in time range)  acetaminophen  (TYLENOL ) tablet 650 mg (has no administration in time range)  traMADol (ULTRAM) tablet 50 mg (50 mg Oral Given 03/01/24 2008)                                    Medical Decision Making Amount and/or Complexity of Data Reviewed Labs: ordered. Radiology: ordered.  Risk OTC drugs. Prescription drug management.   This patient presents to the ED for concern of left knee pain, this involves an extensive number of treatment options, and is a complaint that carries with it a  high risk of complications and morbidity.  I considered the following differential and admission for this acute, potentially life threatening condition.  The differential diagnosis includes osteoarthritis, osteomyelitis, DVT, cellulitis, septic joint  MDM:    Patient is a 83 year old who had a prior injury to her left knee.  She had some increased pain today in her left leg.  She feels like she has a little bit of swelling.  There is no warmth or erythema or clinical suggestions of infection.  X-rays show a cystic structure in the proximal tibia.  No evidence of osteomyelitis on the imaging.  No fracture.  There is a lot of degenerative changes.  No significant effusion that would be more concerning for other etiology such as septic arthritis.  She was given dose of tramadol.  Labs are pending.  Will reassess.  Care turned over to Dr. Charlyn pending reassessment after labs.  (Labs, imaging, consults)  Labs: I Ordered, and personally interpreted labs.  The pertinent results include: Pending  Imaging Studies ordered: I ordered imaging studies including x-ray left knee I independently visualized and interpreted imaging. I agree with the radiologist interpretation  Additional history obtained from family at bedside.  External records from outside source obtained and reviewed including history  Cardiac Monitoring: The patient was not maintained on a cardiac  monitor.  If on the cardiac monitor, I personally viewed and interpreted the cardiac monitored which showed an underlying rhythm of:    Reevaluation: After the interventions noted above, I reevaluated the patient and found that they have :improved  Social Determinants of Health:    Disposition: Pending  Co morbidities that complicate the patient evaluation  Past Medical History:  Diagnosis Date   Abnormal heart rhythms    Allergic rhinitis    Anxiety    Arthritis    Bronchitis    Chest wall pain    ANTERIOR   Depression    Dysuria    Eustachian tube dysfunction    Gallstones    Gallstones    GERD (gastroesophageal reflux disease)    Hyperlipidemia    Hypertension    Hypothyroidism    Hypoxemia    Incontinence    Liver function study, abnormal    Malaise and fatigue    Microscopic hematuria    Osteopenia    Personal history of colonic polyps    Primary breast malignancy (HCC)    Snoring    Tobacco abuse    Ulcer      Medicines Meds ordered this encounter  Medications   traMADol (ULTRAM) tablet 50 mg   naproxen (NAPROSYN) tablet 250 mg   acetaminophen  (TYLENOL ) tablet 650 mg    I have reviewed the patients home medicines and have made adjustments as needed  Problem List / ED Course: Problem List Items Addressed This Visit   None Visit Diagnoses       Acute pain of left knee    -  Primary                Final diagnoses:  Acute pain of left knee    ED Discharge Orders     None          Lenor Hollering, MD 03/01/24 2107

## 2024-03-01 NOTE — ED Provider Notes (Signed)
  Physical Exam  BP 139/69 (BP Location: Left Arm)   Pulse 76   Temp 98.4 F (36.9 C)   Resp 18   Ht 5' 3 (1.6 m)   Wt 81.6 kg   SpO2 92%   BMI 31.87 kg/m   Physical Exam  Procedures  Procedures  ED Course / MDM    Medical Decision Making Amount and/or Complexity of Data Reviewed Labs: ordered. Radiology: ordered.  Risk OTC drugs. Prescription drug management.   Pt comes in with cc of Leg pain. Previous hx of trauma and infection to the same side. Leg is swollen. Unable to ambulate. No knee effusion. Xrays tibia - overall reassuring.   CBC is reassuring. Dimer is ordered. Reassess after pain meds.  If workup neg - d/c with ortho f/u. If unable to ambulate, will need to consult toc.    11:38 PM Unable to ambulate.  D-dimer elevated.  DVT study is already in place.  PT OT and TOC consult placed.  Patient lives by herself.  Family is at the bedside.  Patient not comfortable going home this way. CBC and BMP is normal   Charlyn Sora, MD 03/01/24 2338

## 2024-03-02 ENCOUNTER — Emergency Department (HOSPITAL_COMMUNITY)

## 2024-03-02 ENCOUNTER — Encounter (HOSPITAL_COMMUNITY): Payer: Self-pay | Admitting: Family Medicine

## 2024-03-02 DIAGNOSIS — R1310 Dysphagia, unspecified: Secondary | ICD-10-CM | POA: Diagnosis not present

## 2024-03-02 DIAGNOSIS — J449 Chronic obstructive pulmonary disease, unspecified: Secondary | ICD-10-CM | POA: Insufficient documentation

## 2024-03-02 DIAGNOSIS — Z720 Tobacco use: Secondary | ICD-10-CM | POA: Diagnosis not present

## 2024-03-02 DIAGNOSIS — Z79890 Hormone replacement therapy (postmenopausal): Secondary | ICD-10-CM | POA: Diagnosis not present

## 2024-03-02 DIAGNOSIS — J309 Allergic rhinitis, unspecified: Secondary | ICD-10-CM | POA: Diagnosis not present

## 2024-03-02 DIAGNOSIS — E039 Hypothyroidism, unspecified: Secondary | ICD-10-CM | POA: Diagnosis not present

## 2024-03-02 DIAGNOSIS — J4489 Other specified chronic obstructive pulmonary disease: Secondary | ICD-10-CM | POA: Diagnosis not present

## 2024-03-02 DIAGNOSIS — M7989 Other specified soft tissue disorders: Secondary | ICD-10-CM

## 2024-03-02 DIAGNOSIS — R0902 Hypoxemia: Secondary | ICD-10-CM | POA: Diagnosis present

## 2024-03-02 DIAGNOSIS — K219 Gastro-esophageal reflux disease without esophagitis: Secondary | ICD-10-CM | POA: Diagnosis not present

## 2024-03-02 DIAGNOSIS — Z923 Personal history of irradiation: Secondary | ICD-10-CM | POA: Diagnosis not present

## 2024-03-02 DIAGNOSIS — R262 Difficulty in walking, not elsewhere classified: Secondary | ICD-10-CM | POA: Diagnosis not present

## 2024-03-02 DIAGNOSIS — E785 Hyperlipidemia, unspecified: Secondary | ICD-10-CM | POA: Diagnosis not present

## 2024-03-02 DIAGNOSIS — I110 Hypertensive heart disease with heart failure: Secondary | ICD-10-CM | POA: Diagnosis not present

## 2024-03-02 DIAGNOSIS — F17210 Nicotine dependence, cigarettes, uncomplicated: Secondary | ICD-10-CM | POA: Diagnosis not present

## 2024-03-02 DIAGNOSIS — R41841 Cognitive communication deficit: Secondary | ICD-10-CM | POA: Diagnosis not present

## 2024-03-02 DIAGNOSIS — M25562 Pain in left knee: Secondary | ICD-10-CM | POA: Diagnosis not present

## 2024-03-02 DIAGNOSIS — M810 Age-related osteoporosis without current pathological fracture: Secondary | ICD-10-CM | POA: Diagnosis not present

## 2024-03-02 DIAGNOSIS — Z853 Personal history of malignant neoplasm of breast: Secondary | ICD-10-CM | POA: Diagnosis not present

## 2024-03-02 DIAGNOSIS — M1712 Unilateral primary osteoarthritis, left knee: Secondary | ICD-10-CM | POA: Insufficient documentation

## 2024-03-02 DIAGNOSIS — H9190 Unspecified hearing loss, unspecified ear: Secondary | ICD-10-CM | POA: Diagnosis not present

## 2024-03-02 DIAGNOSIS — Z79899 Other long term (current) drug therapy: Secondary | ICD-10-CM | POA: Diagnosis not present

## 2024-03-02 DIAGNOSIS — F411 Generalized anxiety disorder: Secondary | ICD-10-CM | POA: Diagnosis not present

## 2024-03-02 DIAGNOSIS — J441 Chronic obstructive pulmonary disease with (acute) exacerbation: Secondary | ICD-10-CM | POA: Diagnosis not present

## 2024-03-02 DIAGNOSIS — Z7982 Long term (current) use of aspirin: Secondary | ICD-10-CM | POA: Diagnosis not present

## 2024-03-02 DIAGNOSIS — R911 Solitary pulmonary nodule: Secondary | ICD-10-CM | POA: Diagnosis not present

## 2024-03-02 DIAGNOSIS — I2510 Atherosclerotic heart disease of native coronary artery without angina pectoris: Secondary | ICD-10-CM | POA: Diagnosis not present

## 2024-03-02 DIAGNOSIS — Z716 Tobacco abuse counseling: Secondary | ICD-10-CM | POA: Diagnosis not present

## 2024-03-02 DIAGNOSIS — J302 Other seasonal allergic rhinitis: Secondary | ICD-10-CM | POA: Diagnosis not present

## 2024-03-02 DIAGNOSIS — Z9013 Acquired absence of bilateral breasts and nipples: Secondary | ICD-10-CM | POA: Diagnosis not present

## 2024-03-02 DIAGNOSIS — I10 Essential (primary) hypertension: Secondary | ICD-10-CM | POA: Diagnosis not present

## 2024-03-02 DIAGNOSIS — R0609 Other forms of dyspnea: Secondary | ICD-10-CM | POA: Diagnosis not present

## 2024-03-02 DIAGNOSIS — R7303 Prediabetes: Secondary | ICD-10-CM | POA: Diagnosis not present

## 2024-03-02 DIAGNOSIS — M6281 Muscle weakness (generalized): Secondary | ICD-10-CM | POA: Diagnosis not present

## 2024-03-02 LAB — HEMOGLOBIN A1C
Hgb A1c MFr Bld: 5.4 % (ref 4.8–5.6)
Mean Plasma Glucose: 108.28 mg/dL

## 2024-03-02 LAB — MAGNESIUM: Magnesium: 1.7 mg/dL (ref 1.7–2.4)

## 2024-03-02 LAB — BRAIN NATRIURETIC PEPTIDE: B Natriuretic Peptide: 201.9 pg/mL — ABNORMAL HIGH (ref 0.0–100.0)

## 2024-03-02 LAB — PHOSPHORUS: Phosphorus: 3.6 mg/dL (ref 2.5–4.6)

## 2024-03-02 LAB — TSH: TSH: 4.439 u[IU]/mL (ref 0.350–4.500)

## 2024-03-02 MED ORDER — ALBUTEROL SULFATE (2.5 MG/3ML) 0.083% IN NEBU
5.0000 mg | INHALATION_SOLUTION | Freq: Once | RESPIRATORY_TRACT | Status: AC
  Administered 2024-03-02: 5 mg via RESPIRATORY_TRACT
  Filled 2024-03-02: qty 6

## 2024-03-02 MED ORDER — OXYCODONE HCL 5 MG PO TABS
5.0000 mg | ORAL_TABLET | Freq: Four times a day (QID) | ORAL | Status: DC | PRN
  Administered 2024-03-02: 5 mg via ORAL

## 2024-03-02 MED ORDER — ATORVASTATIN CALCIUM 40 MG PO TABS
40.0000 mg | ORAL_TABLET | Freq: Every day | ORAL | Status: DC
  Administered 2024-03-02 – 2024-03-04 (×3): 40 mg via ORAL
  Filled 2024-03-02 (×4): qty 1

## 2024-03-02 MED ORDER — UMECLIDINIUM-VILANTEROL 62.5-25 MCG/ACT IN AEPB
1.0000 | INHALATION_SPRAY | Freq: Every day | RESPIRATORY_TRACT | Status: DC
  Administered 2024-03-04 – 2024-03-05 (×2): 1 via RESPIRATORY_TRACT
  Filled 2024-03-02: qty 14

## 2024-03-02 MED ORDER — CLONAZEPAM 0.25 MG PO TBDP
0.2500 mg | ORAL_TABLET | Freq: Every day | ORAL | Status: DC
  Administered 2024-03-02 – 2024-03-04 (×3): 0.25 mg via ORAL
  Filled 2024-03-02 (×3): qty 1

## 2024-03-02 MED ORDER — HYDROCHLOROTHIAZIDE 12.5 MG PO TABS
25.0000 mg | ORAL_TABLET | Freq: Every day | ORAL | Status: DC
  Administered 2024-03-02 – 2024-03-05 (×4): 25 mg via ORAL
  Filled 2024-03-02: qty 1
  Filled 2024-03-02 (×3): qty 2

## 2024-03-02 MED ORDER — HYDROCODONE-ACETAMINOPHEN 5-325 MG PO TABS
1.0000 | ORAL_TABLET | Freq: Once | ORAL | Status: AC
Start: 2024-03-02 — End: 2024-03-02
  Administered 2024-03-02: 1 via ORAL
  Filled 2024-03-02: qty 1

## 2024-03-02 MED ORDER — LISINOPRIL 20 MG PO TABS
40.0000 mg | ORAL_TABLET | Freq: Every day | ORAL | Status: DC
  Administered 2024-03-02 – 2024-03-05 (×3): 40 mg via ORAL
  Filled 2024-03-02 (×4): qty 2

## 2024-03-02 MED ORDER — PROSIGHT PO TABS
1.0000 | ORAL_TABLET | Freq: Every day | ORAL | Status: DC
  Administered 2024-03-02 – 2024-03-05 (×3): 1 via ORAL
  Filled 2024-03-02 (×4): qty 1

## 2024-03-02 MED ORDER — LEVOTHYROXINE SODIUM 137 MCG PO TABS
137.0000 ug | ORAL_TABLET | Freq: Every day | ORAL | Status: DC
  Administered 2024-03-02 – 2024-03-05 (×4): 137 ug via ORAL
  Filled 2024-03-02 (×4): qty 1

## 2024-03-02 MED ORDER — ASPIRIN 81 MG PO TBEC
81.0000 mg | DELAYED_RELEASE_TABLET | Freq: Every day | ORAL | Status: DC
  Administered 2024-03-02 – 2024-03-05 (×4): 81 mg via ORAL
  Filled 2024-03-02 (×4): qty 1

## 2024-03-02 MED ORDER — ENOXAPARIN SODIUM 40 MG/0.4ML IJ SOSY
40.0000 mg | PREFILLED_SYRINGE | INTRAMUSCULAR | Status: DC
  Administered 2024-03-02 – 2024-03-04 (×3): 40 mg via SUBCUTANEOUS
  Filled 2024-03-02 (×3): qty 0.4

## 2024-03-02 MED ORDER — IPRATROPIUM-ALBUTEROL 0.5-2.5 (3) MG/3ML IN SOLN
3.0000 mL | RESPIRATORY_TRACT | Status: DC | PRN
  Filled 2024-03-02: qty 3

## 2024-03-02 MED ORDER — POTASSIUM CHLORIDE 20 MEQ PO PACK: 40.0000 meq | PACK | Freq: Once | ORAL | Status: DC

## 2024-03-02 MED ORDER — AMLODIPINE BESYLATE 5 MG PO TABS
5.0000 mg | ORAL_TABLET | Freq: Every day | ORAL | Status: DC
  Administered 2024-03-02 – 2024-03-05 (×3): 5 mg via ORAL
  Filled 2024-03-02 (×4): qty 1

## 2024-03-02 MED ORDER — IOHEXOL 350 MG/ML SOLN
75.0000 mL | Freq: Once | INTRAVENOUS | Status: AC | PRN
  Administered 2024-03-02: 75 mL via INTRAVENOUS

## 2024-03-02 MED ORDER — DICLOFENAC SODIUM 1 % EX GEL
2.0000 g | Freq: Four times a day (QID) | CUTANEOUS | Status: DC | PRN
  Administered 2024-03-04 (×2): 2 g via TOPICAL
  Filled 2024-03-02: qty 100

## 2024-03-02 MED ORDER — CARVEDILOL 6.25 MG PO TABS
3.1250 mg | ORAL_TABLET | Freq: Two times a day (BID) | ORAL | Status: DC
  Administered 2024-03-02 – 2024-03-05 (×5): 3.125 mg via ORAL
  Filled 2024-03-02 (×6): qty 1

## 2024-03-02 MED ORDER — OXYCODONE HCL 5 MG PO TABS
2.5000 mg | ORAL_TABLET | Freq: Four times a day (QID) | ORAL | Status: DC | PRN
  Filled 2024-03-02: qty 1

## 2024-03-02 MED ORDER — PANTOPRAZOLE SODIUM 40 MG PO TBEC
40.0000 mg | DELAYED_RELEASE_TABLET | Freq: Every day | ORAL | Status: DC
  Administered 2024-03-03 – 2024-03-05 (×3): 40 mg via ORAL
  Filled 2024-03-02 (×3): qty 1

## 2024-03-02 MED ORDER — OCUVITE-LUTEIN PO CAPS: 1.0000 | ORAL_CAPSULE | Freq: Every day | ORAL | Status: DC

## 2024-03-02 MED ORDER — LORATADINE 10 MG PO TABS
10.0000 mg | ORAL_TABLET | Freq: Every day | ORAL | Status: DC
  Administered 2024-03-03 – 2024-03-05 (×3): 10 mg via ORAL
  Filled 2024-03-02 (×3): qty 1

## 2024-03-02 NOTE — Hospital Course (Addendum)
 [name] is a 83 y.o.female with a history of COPD, tobacco use, HTN, HLD, left knee arthritis, anxiety/depression who was admitted to the family medicine teaching Service at [facility] for hypoxia requiring new supplemental oxygen. Her hospital course is detailed below:  Hypoxia COPD (chronic obstructive pulmonary disease)  [name] originally presented to the ED with complaint of inability to bear weight on left leg from severe arthritis of left knee.  Upon discharge from ED, patient was found to have a new oxygen requirement since she was desatting to mid 80s during ambulation.  In the ED, patient workup for left knee pain was largely unremarkable for acute findings.  DVT and PE workup negative.  She received an albuterol breathing treatment and admitted.  Cardiac workup unremarkable for heart failure exacerbation.  Hospital course was uneventful, the patient was gradually weaned from 2 L of nasal cannula oxygen to 1 L and was able to be weaned down to room air on discharge.  Patient worked well with PT/OT and was able to be weaned down to room air during ambulation, not requiring any supplemental oxygen on discharge.  Arthritis of left knee Patient worked well with PT/OT throughout hospital course.  She was given Tylenol  and Voltaren gel as needed for pain which was well-controlled.  Other chronic conditions were medically managed with home medications and formulary alternatives as necessary (prediabetes, HTN, hypothyroidism, GERD, anxiety, pulmonary nodule, tobacco use)  PCP Follow-up Recommendations: Consider PFTs Discuss smoking cessation Outpatient PT for left knee pain

## 2024-03-02 NOTE — Assessment & Plan Note (Signed)
 Known severe arthritis of left knee s/p multiple distant operations following infected tib-fib fracture years ago.  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 - PT/OT eval and treat - Tylenol  as needed - Voltaren gel as needed

## 2024-03-02 NOTE — Progress Notes (Signed)
 PT Cancellation Note  Patient Details Name: [name] MRN: [id number] DOB: 1940/08/28   Cancelled Treatment:    Reason Eval/Treat Not Completed: Medical issues which prohibited therapy. Pt with LLE swelling and elevated D-dimer, awaiting for LE vascular ultrasound to rule out DVT. PT will follow up after ultrasound is completed.   Bernardino JINNY Ruth 03/02/2024, 8:26 AM

## 2024-03-02 NOTE — Progress Notes (Signed)
 Left lower extremity venous duplex has been completed. Preliminary results can be found in CV Proc through chart review.  Results were given to Dr. Doretha.  03/02/24 9:47 AM Cathlyn Collet RVT

## 2024-03-02 NOTE — Plan of Care (Signed)
 FMTS Brief Progress Note  S:  Went to evaluate patient at bedside with Dr. Coralee.  Patient reports that she feels well.  She was excited to tell us  that she was able to ambulate with her walker today, which she has not been able to do since Friday.  No trouble breathing.   O: BP 132/60 (BP Location: Right Arm)   Pulse 94   Temp 98.2 F (36.8 C) (Oral)   Resp 18   Ht 5' 3 (1.6 m)   Wt 81.6 kg   SpO2 93%   BMI 31.87 kg/m    General: NAD, pleasant Cardio: RRR, no MRG. Cap Refill <2s. Respiratory: Mild expiratory wheezing bilaterally, normal wob on 2L LFNC GI: Abdomen is soft, not tender, not distended. BS present  A/P: - Continue breathing treatments as needed - Continue to work with PT - Rest of plan per H&P by day team - Orders reviewed. Labs for AM ordered, which was adjusted as needed.  - If condition changes, plan includes page family medicine teaching service.   Howell Lunger, DO 03/02/2024, 8:50 PM PGY-3, [facility] Night Resident  Please page [phone] with questions.

## 2024-03-02 NOTE — Assessment & Plan Note (Signed)
 Patient with documented history of COPD, not on any maintenance medication at baseline.  CT PE negative, did demonstrate lower lobe bronchitis, no evidence of pneumonia.  Received albuterol treatment in ED.  Given orthopnea, will pursue cardiac workup as well. - Admit to FMTS with attending Dr. Anders, MedSurg floor - Begin Anoro Ellipta maintenance inhaler - DuoNebs every 6 as needed - BNP ordered - Echocardiogram ordered - Repeat EKG - Daily weights, strict I's/O - Consider PFTs outpatient - AM BMP -replete electrolytes as indicated

## 2024-03-02 NOTE — ED Provider Notes (Signed)
 Patient is found to have a new oxygen requirement.  She had oxygen saturations of 86 to 88% on room air and was placed on nasal cannula at 2 L.  Family does say she has a history of COPD.  Will try nebulizer treatment.  CTA of her chest does not show any evidence of PE or pneumonia.  Discussed with family medicine resident who will admit the patient for further treatment.   Lenor Hollering, MD 03/02/24 [phone]

## 2024-03-02 NOTE — H&P (Signed)
 Hospital Admission History and Physical Service Pager: [phone]  Patient name: [name] Medical record number: [id number] Date of Birth: 07/02/1940 Age: 83 y.o. Gender: female  Primary Care Provider: Hurst Norleen PEDLAR, MD Consultants: N/a Code Status: Full which was confirmed by patient Preferred Emergency Contact:  Contact Information     Name Relation Home Work Mobile   cook,Jennifer Daughter   [phone]   Bluford Adine ONEIDA Gilmer [phone]  [phone]      Other Contacts   None on File     Chief Complaint: Hypoxia   Differential and Medical Decision Making:  [name] is a 83 y.o. female presenting with hypoxia while ambulating, with new oxygen requirement.  Patient originally presented to ED on 11/8 for left knee pain, for which workup has been largely unremarkable, was planning for SNF, when found to have desaturations to mid 80s on ambulation. Differential for this patient's presentation of this includes:  COPD: Patient with history of COPD and extensive smoking history.  Not on any maintenance COPD medications at home. CHF: No known history of heart failure though patient does endorse orthopnea. ACS: Unlikely given EKG without acute ischemic findings. PE: Unlikely given negative CT PE.  Assessment & Plan Hypoxia COPD (chronic obstructive pulmonary disease) (HCC) Patient with documented history of COPD, not on any maintenance medication at baseline.  CT PE negative, did demonstrate lower lobe bronchitis, no evidence of pneumonia.  Received albuterol treatment in ED.  Given orthopnea, will pursue cardiac workup as well. - Admit to FMTS with attending Dr. Anders, MedSurg floor - Begin Anoro Ellipta maintenance inhaler - DuoNebs every 6 as needed - BNP ordered - Echocardiogram ordered - Repeat EKG - Daily weights, strict I's/O - Consider PFTs outpatient - AM BMP -replete electrolytes as indicated Arthritis of left knee Known severe arthritis of left knee  s/p multiple distant operations following infected tib-fib fracture years ago.  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 - PT/OT eval and treat - Tylenol  as needed - Voltaren gel as needed   Chronic and Stable Conditions: Prediabetes: Reports history of prediabetes.  Will obtain A1c here. HTN: Continue home amlodipine 5 daily, atorvastatin 40 daily, Coreg 3.125 twice daily, HCTZ 25 daily, lisinopril 40 daily Hypothyroid: Continue home Synthroid  137 mcg daily GERD: Continue home Protonix  40 daily Anxiety: Continue home Klonopin  nightly Pulmonary nodule: Known RUL nodule seen on CTPE which appears similar in size to prior 03/01/2023 imaging.  PCP and primary oncology to follow. Tobacco use: Patient declines nicotine patch at this time  FEN/GI: Regular diet VTE Prophylaxis: Lovenox   Disposition: MedSurg  History of Present Illness:  [name] is a 83 y.o. female originally presenting to ED on 11/8 for left knee pain.  Patient with known severe arthritis of left knee though over recent days has been less able to bear weight on this knee.  No recent falls or injury.  Patient lives alone and independently manages herself.  While in ED, patient found to have new oxygen requirement, desatting to mid 80s upon ambulation.  Patient has history of COPD but does not use any COPD medications and is not on any oxygen at home.  Patient reports no difficulty breathing or shortness of breath.  Reports chronic daily unchanged cough, nonproductive.  Current daily smoker.  In the ED, patient initially received workup for left knee pain, which  was largely unremarkable for acute findings.  Found to be hypoxic to 86% upon ambulation, requiring new oxygen requirement.  DVT and PE workup negative.  Received albuterol breathing treatment.  Review Of Systems: Per HPI    Pertinent Past Medical History: Breast cancer s/p bilateral mastectomy Cancer of lip s/p Mohs Osteoarthritis Anxiety HLD HTN Hypothyroidism Osteopenia remainder reviewed in history tab.   Pertinent Past Surgical History: Bilateral mastectomy Cholecystectomy Maloney dilation Mohs procedure for lip cancer Remainder reviewed in history tab.   Pertinent Social History: Tobacco use: Current, 1 pack per 2 weeks, greater than 50 years use Alcohol use: None Other Substance use: None Lives by herself  Pertinent Family History: Father: Heart disease Mother: Cancer Brother: Heart disease.   Important Outpatient Medications: Amlodipine 5 daily Aspirin 81 daily Atorvastatin 40 daily Carvedilol 3.125 twice daily Clonazepam  0.  2 5 nightly HCTZ 25 daily Synthroid  137 daily Lisinopril 40 daily Zyrtec 10 daily Protonix  40 daily  Objective: BP (!) 154/61 (BP Location: Left Arm)   Pulse 89   Temp 98.9 F (37.2 C) (Oral)   Resp 18   Ht 5' 3 (1.6 m)   Wt 81.6 kg   SpO2 90%   BMI 31.87 kg/m  Exam: General: No acute distress. Resting comfortably in room. CV: Normal S1/S2. No extra heart sounds. Warm and well-perfused. Pulm: Breathing comfortably on 2L O2. Some bibasilar crackles. No wheezing. No increased WOB. Abd: Normoactive bowel sounds. Soft, non-tender, non-distended. Skin:  Warm, dry. Ext: RLE without edema or tenderness.  Left lower extremity with surgical scars and some edema throughout, cooler to the touch than other lower extremity.  Trace edema medial to left patella.  Left knee ROM limited by pain though able to bend independently.  Intact dorsalis pedis pulses bilaterally. Psych: Pleasant and appropriate.    Labs:  CBC BMET  Recent Labs  Lab 03/01/24 2030  WBC 10.7*  HGB 12.7  HCT 38.3  PLT 227   Recent Labs  Lab 03/01/24 2151  NA 137  K 3.2*  CL 98  CO2 24  BUN 12  CREATININE 0.49  GLUCOSE 102*  CALCIUM 9.0     EKG: Ordered   Imaging  Studies Performed: CT Angio Chest PE W and/or Wo Contrast Result Date: 03/02/2024 EXAM: CTA of the Chest with contrast for PE 03/02/2024 01:48:19 PM TECHNIQUE: CTA of the chest was performed without and with the administration of 75 mL of intravenous iohexol  (OMNIPAQUE ) 350 MG/ML injection. Multiplanar reformatted images are provided for review. MIP images are provided for review. Automated exposure control, iterative reconstruction, and/or weight based adjustment of the mA/kV was utilized to reduce the radiation dose to as low as reasonably achievable. COMPARISON: CT 02/28/2024, CT 03/01/2023, and CT 06/24/2023. CLINICAL HISTORY: Pulmonary embolism (PE) suspected, low to intermediate prob, positive D-dimer. FINDINGS: PULMONARY ARTERIES: Pulmonary arteries are adequately opacified for evaluation. No pulmonary embolism. Main pulmonary artery is normal in caliber. MEDIASTINUM: Unchanged small pericardial effusion. There is no acute abnormality of the thoracic aorta. LYMPH NODES: No mediastinal, hilar or axillary lymphadenopathy. LUNGS AND PLEURA: 10 mm left upper lobe nodule on series 6 image 43 is unchanged from 2018 and benign.no follow up recommended . Diffuse bronchial wall thickening and mucus plugging greatest in the lower lobes. Apical pleural parenchymal scarring. Ground glass nodule in the right upper lobe measures 2.9 x 2.5 cm and is similar to slightly increased dating back to 03/01/2023. No focal consolidation or pulmonary edema. No pleural effusion or pneumothorax. UPPER ABDOMEN:  Unchanged nodular thickening of both adrenal glands. SOFT TISSUES AND BONES: Compression fracture of the superior endplate of T12 is new since the most recent comparison of CT 06/24/2023. There is 20% vertebral body height loss with 3-4 mm of retropulsion of the superior endplate. IMPRESSION: 1. No evidence of pulmonary embolism. 2. Lower lobe bronchitis. 3. Age indeterminate compression fracture of the superior endplate of T12  with 20% vertebral body height loss and 34 mm retropulsion. 4. Ground glass nodule in the right upper lobe measures 2.9 x 2.5 cm, similar to slightly increased since 03/01/23. 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 Continued follow up recommended. Electronically signed by: Norman Gatlin MD 03/02/2024 02:20 PM EST RP Workstation: [id number]   VAS US  LOWER EXTREMITY VENOUS (DVT) (7a-7p) Result Date: 03/02/2024  Lower Venous DVT Study Patient Name:  SMITH MCNICHOLAS  Date of Exam:   03/02/2024 Medical Rec #: [id number]       Accession #:    [id number] Date of Birth: [birth year]       Patient Gender: F Patient Age:   83 years Exam Location:  [facility] Procedure:      VAS US  LOWER EXTREMITY VENOUS (DVT) Referring Phys: MELANIE BELFI --------------------------------------------------------------------------------  Indications: Swelling.  Risk Factors: None identified. Comparison Study: No prior studies. Performing Technologist: Cordella Collet RVT  Examination Guidelines: A complete evaluation includes B-mode imaging, spectral Doppler, color Doppler, and power Doppler as needed of all accessible portions of each vessel. Bilateral testing is considered an integral part of a complete examination. Limited examinations for reoccurring indications may be performed as noted. The reflux portion of the exam is performed with the patient in reverse Trendelenburg.  +-----+---------------+---------+-----------+----------+--------------+ RIGHTCompressibilityPhasicitySpontaneityPropertiesThrombus Aging +-----+---------------+---------+-----------+----------+--------------+ CFV  Full           Yes      Yes                                 +-----+---------------+---------+-----------+----------+--------------+   +---------+---------------+---------+-----------+----------+--------------+ LEFT     CompressibilityPhasicitySpontaneityPropertiesThrombus Aging  +---------+---------------+---------+-----------+----------+--------------+ CFV      Full           Yes      Yes                                 +---------+---------------+---------+-----------+----------+--------------+ SFJ      Full                                                        +---------+---------------+---------+-----------+----------+--------------+ FV Prox  Full                                                        +---------+---------------+---------+-----------+----------+--------------+ FV Mid   Full                                                        +---------+---------------+---------+-----------+----------+--------------+ FV  DistalFull           Yes      Yes                                 +---------+---------------+---------+-----------+----------+--------------+ PFV      Full                                                        +---------+---------------+---------+-----------+----------+--------------+ POP      Full           Yes      Yes                                 +---------+---------------+---------+-----------+----------+--------------+ PTV      Full                                                        +---------+---------------+---------+-----------+----------+--------------+ PERO     Full                                                        +---------+---------------+---------+-----------+----------+--------------+     Summary: RIGHT: - No evidence of common femoral vein obstruction.   LEFT: - There is no evidence of deep vein thrombosis in the lower extremity.  - No cystic structure found in the popliteal fossa.  *See table(s) above for measurements and observations. Electronically signed by Debby Robertson on 03/02/2024 at 2:14:19 PM.    Final     DG Knee 11/8: IMPRESSION: 1. Moderate-to-severe tricompartmental osteoarthrosis of the left knee with joint space narrowing and osteophyte formation. 2.  Indeterminate lucent lesion with sclerotic border in the proximal tibial metadiaphyseal region laterally measuring 3.5 x 6.7 x 5.0 cm. 3. Soft tissue calcifications and surgical clips adjacent to the proximal tibia.  Diona Perkins, MD 03/02/2024, 4:45 PM PGY-2, [facility]  FPTS Intern pager: [phone], text pages welcome Secure chat group [facility] Teaching Service

## 2024-03-02 NOTE — NC FL2 (Signed)
 [place]  MEDICAID FL2 LEVEL OF CARE FORM     IDENTIFICATION  Patient Name: [name] Birthdate: [birth year] Sex: female Admission Date (Current Location): 03/01/2024  [facility] and [place] Number:  Producer, Television/film/video and Address:  [facility], 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   Choledocholithiasis with obstruction 06/24/2023   Hyponatremia 06/24/2023   Elevated liver function tests 06/24/2023   Hypokalemia 06/24/2023   Hypochloremia 06/24/2023   Nodule of upper lobe of right lung 08/23/2021   Esophageal dysphagia 11/15/2017   Lobular carcinoma of breast (HCC) 10/31/2016   Constipation 05/11/2010   Other dysphagia 05/11/2010   Abdominal pain 05/11/2010   TOBACCO ABUSE 11/04/2008   Cough 09/04/2008   ANXIETY DEPRESSION 08/07/2008   Otitis media 05/07/2008   Osteoarthritis 02/18/2008   Calculus of gallbladder 09/23/2007   Hypoxemia 04/30/2007   Microscopic hematuria 04/16/2007   INCONTINENCE 04/16/2007   History of colonic polyps 03/21/2006   Hypothyroidism 03/21/2006   Hyperlipidemia 03/21/2006   CATARACT NOS 03/21/2006   Essential hypertension 03/21/2006   Allergic rhinitis 03/21/2006   GERD 03/21/2006   Arthropathy 03/21/2006   Disorder of bone and cartilage 03/21/2006   HX, PERSONAL, MALIGNANCY, BREAST 03/21/2006    Orientation RESPIRATION BLADDER Height & Weight     Self, Situation, Time, Place  O2 (1-2L [place]) Continent Weight: 179 lb 14.3 oz (81.6 kg) Height:  5' 3 (160 cm)  BEHAVIORAL  SYMPTOMS/MOOD NEUROLOGICAL BOWEL NUTRITION STATUS      Continent    AMBULATORY STATUS COMMUNICATION OF NEEDS Skin   Limited Assist Verbally Normal                       Personal Care Assistance Level of Assistance  Bathing, Feeding, Dressing Bathing Assistance: Limited assistance Feeding assistance: Limited assistance Dressing Assistance: Limited assistance     Functional Limitations Info  Sight, Hearing, Speech Sight Info: Adequate Hearing Info: Adequate Speech Info: Adequate    SPECIAL CARE FACTORS FREQUENCY  PT (By licensed PT), OT (By licensed OT)                    Contractures Contractures Info: Not present    Additional Factors Info  Code Status Code Status Info: FULL CODE             Current Medications (03/02/2024):  This is the current hospital active medication list Current Facility-Administered Medications  Medication Dose Route Frequency Provider Last Rate Last Admin   acetaminophen  (TYLENOL ) tablet 650 mg  650 mg Oral Q4H PRN Nanavati, Ankit, MD       amLODipine (NORVASC) tablet 5 mg  5 mg Oral Daily Plunkett, Whitney, MD       aspirin EC tablet 81 mg  81 mg Oral Daily Plunkett, Whitney, MD       atorvastatin (LIPITOR) tablet 40 mg  40 mg Oral QHS Plunkett, Whitney, MD       carvedilol (COREG) tablet 3.125 mg  3.125 mg Oral BID WC Plunkett,  Benton, MD       clonazePAM  (KLONOPIN ) disintegrating tablet 0.25 mg  0.25 mg Oral QHS Plunkett, Whitney, MD       hydrochlorothiazide (HYDRODIURIL) tablet 25 mg  25 mg Oral Daily Plunkett, Benton, MD       levothyroxine  (SYNTHROID ) tablet 137 mcg  137 mcg Oral Q0600 Doretha Benton, MD       lisinopril (ZESTRIL) tablet 40 mg  40 mg Oral Daily Plunkett, Whitney, MD       [START ON 03/03/2024] loratadine  (CLARITIN ) tablet 10 mg  10 mg Oral Daily Plunkett, Whitney, MD       multivitamin (PROSIGHT) tablet 1 tablet  1 tablet Oral Daily Lenor Hollering, MD       NOREEN ON 03/03/2024] pantoprazole  (PROTONIX ) EC  tablet 40 mg  40 mg Oral QAC breakfast Doretha Benton, MD       Current Outpatient Medications  Medication Sig Dispense Refill   acetaminophen  (TYLENOL ) 500 MG tablet Take 1,000 mg by mouth daily as needed (for pain).     amLODipine (NORVASC) 5 MG tablet Take 5 mg by mouth daily.     aspirin EC 81 MG tablet Take 81 mg by mouth daily.     atorvastatin (LIPITOR) 40 MG tablet Take 40 mg by mouth daily.     carvedilol (COREG) 3.125 MG tablet Take 3.125 mg by mouth in the morning and at bedtime.     cetirizine (ZYRTEC) 10 MG tablet Take 10 mg by mouth at bedtime.     clonazePAM  (KLONOPIN ) 0.5 MG tablet Take 0.25 mg by mouth See admin instructions. Take 0.25 mg by mouth at bedtime and an additional 0.25 mg once a day as needed for anxiety     hydrochlorothiazide (HYDRODIURIL) 25 MG tablet Take 25 mg by mouth daily.     levothyroxine  (SYNTHROID , LEVOTHROID) 137 MCG tablet Take 137 mcg by mouth daily before breakfast.   5   lisinopril (ZESTRIL) 40 MG tablet Take 40 mg by mouth daily.     Multiple Vitamins-Minerals (PRESERVISION AREDS 2) CAPS Take 1 capsule by mouth in the morning and at bedtime.     pantoprazole  (PROTONIX ) 40 MG tablet Take 40 mg by mouth daily before breakfast.     TUMS 500 MG chewable tablet Chew 1-2 tablets by mouth daily.       Discharge Medications: Please see discharge summary for a list of discharge medications.  Relevant Imaging Results:  Relevant Lab Results:   Additional Information SS# [id number]  Nasier Thumm [place], [place]

## 2024-03-02 NOTE — Progress Notes (Addendum)
 Consult received for SNF placement.  PT consult pending.  Pt's insurance requires prior auth for SNF.   1257: PT recommendation for SNF noted.  Pt and pt's dtr Delon agreeable to SNF for STR, identify [facility] or [facility] as preferred facilities.  Reviewed SNF placement process and answered questions.  Will f/u with offers as available.  Once SNF chosen, will submit auth request with [employer].   Julien Das, MSW, LCSW [phone] (coverage)

## 2024-03-02 NOTE — ED Notes (Signed)
 Patient oxygen was assess on roomair at 12 pm while in bed, it was 84 86. Patient was placed on 2 liters of oxygen nasal cannula she was 90%

## 2024-03-02 NOTE — Evaluation (Signed)
 Physical Therapy Evaluation Patient Details Name: [name] MRN: [id number] DOB: 12/01/1940 Today's Date: 03/02/2024  History of Present Illness  83 y.o. female presents to [facility] hospital on 03/01/2024 with LLE pain, unable to ambulate. PMH includes HLD, anxiety, HTN, OA, COPD.  Clinical Impression  Pt presents to PT with deficits in functional mobility, gait, balance, strength, power, ROM. Pt with poor tolerance for weightbearing through LLE and is unable to bear weight through L heel when ambulating. PT notes a lack of full knee extension ROM, pt has been resting with a rolled towel under her knee since arrival to the ED. PT encourages passive extension of the knee along with quad sets and heel slides to work on restoring AROM. Pt is unable to ambulate sufficient distances within the home and has a considerable fear of falling due to knee instability. Patient will benefit from continued inpatient follow up therapy, <3 hours/day.        If plan is discharge home, recommend the following: A lot of help with walking and/or transfers;A lot of help with bathing/dressing/bathroom;Assistance with cooking/housework;Help with stairs or ramp for entrance;Assist for transportation   Can travel by private vehicle   Yes    Equipment Recommendations Wheelchair (measurements PT);Wheelchair cushion (measurements PT);BSC/3in1  Recommendations for Other Services       Functional Status Assessment Patient has had a recent decline in their functional status and demonstrates the ability to make significant improvements in function in a reasonable and predictable amount of time.     Precautions / Restrictions Precautions Precautions: Fall;Other (comment) Recall of Precautions/Restrictions: Intact Precaution/Restrictions Comments: monitor sats Restrictions Weight Bearing Restrictions Per Provider Order: No      Mobility  Bed Mobility Overal bed mobility: Needs Assistance Bed Mobility: Rolling,  Sidelying to Sit, Sit to Sidelying Rolling: Contact guard assist Sidelying to sit: Min assist     Sit to sidelying: Min assist      Transfers Overall transfer level: Needs assistance Equipment used: Rolling walker (2 wheels) Transfers: Sit to/from Stand, Bed to chair/wheelchair/BSC Sit to Stand: Contact guard assist, From elevated surface   Step pivot transfers: Min assist, Contact guard assist (minA with hand hold, CGA with RW)            Ambulation/Gait Ambulation/Gait assistance: Contact guard assist Gait Distance (Feet): 5 Feet Assistive device: Rolling walker (2 wheels) Gait Pattern/deviations: Step-to pattern Gait velocity: reduced Gait velocity interpretation: <1.31 ft/sec, indicative of household ambulator   General Gait Details: pt unable to tolerate weightbearing through L heel, performs short step-to gait with TDWB through forefoot  Stairs            Wheelchair Mobility     Tilt Bed    Modified Rankin (Stroke Patients Only)       Balance Overall balance assessment: Needs assistance Sitting-balance support: No upper extremity supported, Feet supported Sitting balance-Leahy Scale: Fair     Standing balance support: Bilateral upper extremity supported, Reliant on assistive device for balance Standing balance-Leahy Scale: Poor                               Pertinent Vitals/Pain Pain Assessment Pain Assessment: Faces Faces Pain Scale: Hurts whole lot Pain Location: L knee Pain Descriptors / Indicators: Aching Pain Intervention(s): Monitored during session    Home Living Family/patient expects to be discharged to:: Private residence Living Arrangements: Alone Available Help at Discharge: Family;Available PRN/intermittently Type of Home: Apartment Home  Access: Level entry       Home Layout: One level Home Equipment: Agricultural Consultant (2 wheels);Rollator (4 wheels);Cane - single point;Electric scooter;Shower seat;Grab bars -  toilet;Grab bars - tub/shower      Prior Function Prior Level of Function : Independent/Modified Independent;Driving             Mobility Comments: ambulatory with [facility]       Extremity/Trunk Assessment   Upper Extremity Assessment Upper Extremity Assessment: Overall WFL for tasks assessed    Lower Extremity Assessment Lower Extremity Assessment: LLE deficits/detail LLE Deficits / Details: pt is lacking full knee extension by ~5 degrees at this time, pain with active knee flexion and extension. Hip and ankle ROM WFL. Pt with prior traumatic injury and infection to LLE with chronic scarring.    Cervical / Trunk Assessment Cervical / Trunk Assessment: Kyphotic  Communication   Communication Communication: Impaired Factors Affecting Communication: Hearing impaired    Cognition Arousal: Alert Behavior During Therapy: Anxious   PT - Cognitive impairments: No apparent impairments                         Following commands: Intact       Cueing Cueing Techniques: Verbal cues     General Comments General comments (skin integrity, edema, etc.): pt on 2L [place] upon PT arrival with sats at 88%. Pt desats to 86% on room air and requires 3L [place] to maintain sats at or above 92%    Exercises     Assessment/Plan    PT Assessment Patient needs continued PT services  PT Problem List Decreased strength;Decreased range of motion;Decreased activity tolerance;Decreased mobility;Decreased balance;Decreased knowledge of use of DME;Pain       PT Treatment Interventions DME instruction;Gait training;Functional mobility training;Therapeutic activities;Therapeutic exercise;Balance training;Neuromuscular re-education;Cognitive remediation;Patient/family education;Wheelchair mobility training    PT Goals (Current goals can be found in the Care Plan section)  Acute Rehab PT Goals Patient Stated Goal: to return to walking unassisted PT Goal Formulation: With patient/family Time For  Goal Achievement: 03/16/24 Potential to Achieve Goals: Fair    Frequency Min 2X/week     Co-evaluation               AM-PAC PT 6 Clicks Mobility  Outcome Measure Help needed turning from your back to your side while in a flat bed without using bedrails?: A Little Help needed moving from lying on your back to sitting on the side of a flat bed without using bedrails?: A Little Help needed moving to and from a bed to a chair (including a wheelchair)?: A Little Help needed standing up from a chair using your arms (e.g., wheelchair or bedside chair)?: A Little Help needed to walk in hospital room?: Total Help needed climbing 3-5 steps with a railing? : Total 6 Click Score: 14    End of Session Equipment Utilized During Treatment: Gait belt;Oxygen Activity Tolerance: Patient limited by pain Patient left: in bed;with call bell/phone within reach;with nursing/sitter in room;with family/visitor present Nurse Communication: Mobility status PT Visit Diagnosis: Other abnormalities of gait and mobility (R26.89);Muscle weakness (generalized) (M62.81);Pain Pain - Right/Left: Left Pain - part of body: Knee    Time: 1102-1210 PT Time Calculation (min) (ACUTE ONLY): 68 min   Charges:   PT Evaluation $PT Eval Low Complexity: 1 Low PT Treatments $Gait Training: 8-22 mins $Therapeutic Activity: 23-37 mins PT General Charges $$ ACUTE PT VISIT: 1 Visit  Bernardino JINNY Ruth, PT, DPT Acute Rehabilitation Office [phone]   Bernardino JINNY Ruth 03/02/2024, 12:30 PM

## 2024-03-02 NOTE — ED Provider Notes (Signed)
 Patient's DVT scan was negative for DVT.  PT coming to evaluate the patient.  Discussed the ultrasound results with the patient and she is thankful that there is no clot.  She is having ongoing pain at her knee and swelling and has x-ray findings of tricompartmental arthritis.  She sees [facility] and is not a candidate for surgery based on her other prior history.  She has had injections of steroids in the past that have been unhelpful.  She reports it is more swollen today but is not erythematous or warm.  Patient is usually taking 500 mg of Tylenol  twice daily for this pain but now reports that hurts so bad she cannot walk.  She has not had anything for pain except Tylenol  since yesterday.  Will give a dose of pain medication before PT attempts to get her up and walk her.   Doretha Folks, MD 03/02/24 [phone]

## 2024-03-03 ENCOUNTER — Inpatient Hospital Stay (HOSPITAL_COMMUNITY)

## 2024-03-03 ENCOUNTER — Telehealth (HOSPITAL_COMMUNITY): Payer: Self-pay | Admitting: Pharmacy Technician

## 2024-03-03 ENCOUNTER — Other Ambulatory Visit (HOSPITAL_COMMUNITY): Payer: Self-pay

## 2024-03-03 DIAGNOSIS — Z789 Other specified health status: Secondary | ICD-10-CM

## 2024-03-03 DIAGNOSIS — R0609 Other forms of dyspnea: Secondary | ICD-10-CM

## 2024-03-03 DIAGNOSIS — R0902 Hypoxemia: Secondary | ICD-10-CM | POA: Diagnosis not present

## 2024-03-03 DIAGNOSIS — M25562 Pain in left knee: Principal | ICD-10-CM

## 2024-03-03 LAB — BASIC METABOLIC PANEL WITH GFR
Anion gap: 13 (ref 5–15)
BUN: 10 mg/dL (ref 8–23)
CO2: 25 mmol/L (ref 22–32)
Calcium: 8.8 mg/dL — ABNORMAL LOW (ref 8.9–10.3)
Chloride: 97 mmol/L — ABNORMAL LOW (ref 98–111)
Creatinine, Ser: 0.67 mg/dL (ref 0.44–1.00)
GFR, Estimated: >60 mL/min (ref >60)
Glucose, Bld: 111 mg/dL — ABNORMAL HIGH (ref 70–99)
Potassium: 3.4 mmol/L — ABNORMAL LOW (ref 3.5–5.1)
Sodium: 135 mmol/L (ref 135–145)

## 2024-03-03 MED ORDER — IPRATROPIUM-ALBUTEROL 0.5-2.5 (3) MG/3ML IN SOLN
3.0000 mL | RESPIRATORY_TRACT | Status: AC
  Administered 2024-03-03: 3 mL via RESPIRATORY_TRACT

## 2024-03-03 MED ORDER — POTASSIUM CHLORIDE CRYS ER 20 MEQ PO TBCR
40.0000 meq | EXTENDED_RELEASE_TABLET | Freq: Once | ORAL | Status: AC
  Administered 2024-03-03: 40 meq via ORAL
  Filled 2024-03-03: qty 2

## 2024-03-03 NOTE — Assessment & Plan Note (Signed)
 Known severe arthritis of left knee s/p multiple distant operations following infected tib-fib fracture years ago.  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 - PT/OT eval and treat - Tylenol  650mg  q4h as needed - Voltaren gel as needed

## 2024-03-03 NOTE — Progress Notes (Signed)
 Physical Therapy Treatment Patient Details Name: [name] MRN: [id number] DOB: 07/08/1940 Today's Date: 03/03/2024   History of Present Illness 83 y.o. female presents to [facility] hospital on 03/01/2024 with LLE pain, unable to ambulate. PMH includes HLD, anxiety, HTN, OA, COPD.    PT Comments  Continuing work on functional mobility and activity tolerance;  Session focused on functional transfers and progressive amb, with noted very nice progress; Practiced with straight cane, rollator, and regular RW, and pt states she is most comfortable and stable using the RW -- this PT agrees!  At this time, Patient requires supplemental oxygen to maintain oxygen saturations at acceptable, safe levels with physical activity.   Overall progressing well; Anticipate continuing good progress at post-acute rehabilitation; she would very much like to get to her grandson's football banquet, which is happening this week; I'm curious if her rehab center might have some sort of day pass, so she can attend.    If plan is discharge home, recommend the following: A lot of help with walking and/or transfers;A lot of help with bathing/dressing/bathroom;Assistance with cooking/housework;Help with stairs or ramp for entrance;Assist for transportation   Can travel by private vehicle     Yes  Equipment Recommendations  Wheelchair (measurements PT);Wheelchair cushion (measurements PT);BSC/3in1    Recommendations for Other Services       Precautions / Restrictions Precautions Precautions: Fall;Other (comment) Recall of Precautions/Restrictions: Intact Precaution/Restrictions Comments: monitor sats Restrictions Weight Bearing Restrictions Per Provider Order: No     Mobility  Bed Mobility               General bed mobility comments: In recliner    Transfers Overall transfer level: Needs assistance Equipment used: Rollator (4 wheels) Transfers: Sit to/from Stand Sit to Stand: Supervision, Contact guard  assist           General transfer comment: Supervision for safety; We discussed more safe and stable options for hand placement    Ambulation/Gait Ambulation/Gait assistance: Contact guard assist Gait Distance (Feet): 110 Feet (x2) Assistive device: Straight cane, Rollator (4 wheels), Rolling walker (2 wheels) Gait Pattern/deviations: Step-to pattern       General Gait Details: Improving weight acceptance LLE, and able to tolerate incr distance; Walked with her shoes; we walked with straight cane at first, then rollator, then regular RW   Stairs             Wheelchair Mobility     Tilt Bed    Modified Rankin (Stroke Patients Only)       Balance     Sitting balance-Leahy Scale: Good       Standing balance-Leahy Scale: Poor                              Communication Communication Communication: Impaired Factors Affecting Communication: Hearing impaired  Cognition Arousal: Alert Behavior During Therapy: WFL for tasks assessed/performed   PT - Cognitive impairments: No apparent impairments                         Following commands: Intact      Cueing Cueing Techniques: Verbal cues  Exercises      General Comments General comments (skin integrity, edema, etc.): See other PT note of this date for O2 qualifying walk      Pertinent Vitals/Pain Pain Assessment Pain Assessment: Faces Faces Pain Scale: Hurts little more (pain increasing with more distance) Pain Location:  L knee Pain Descriptors / Indicators: Aching Pain Intervention(s): Monitored during session    Home Living                          Prior Function            PT Goals (current goals can now be found in the care plan section) Acute Rehab PT Goals Patient Stated Goal: to return to walking unassisted PT Goal Formulation: With patient/family Time For Goal Achievement: 03/16/24 Potential to Achieve Goals: Fair Progress towards PT goals:  Progressing toward goals    Frequency    Min 2X/week      PT Plan      Co-evaluation              AM-PAC PT 6 Clicks Mobility   Outcome Measure  Help needed turning from your back to your side while in a flat bed without using bedrails?: A Little Help needed moving from lying on your back to sitting on the side of a flat bed without using bedrails?: A Little Help needed moving to and from a bed to a chair (including a wheelchair)?: A Little Help needed standing up from a chair using your arms (e.g., wheelchair or bedside chair)?: A Little Help needed to walk in hospital room?: A Lot Help needed climbing 3-5 steps with a railing? : Total 6 Click Score: 15    End of Session Equipment Utilized During Treatment: Gait belt;Oxygen Activity Tolerance: Patient tolerated treatment well Patient left: in chair;with call bell/phone within reach;with family/visitor present Nurse Communication: Mobility status PT Visit Diagnosis: Other abnormalities of gait and mobility (R26.89);Muscle weakness (generalized) (M62.81);Pain Pain - Right/Left: Left Pain - part of body: Knee     Time: 1320-1400 PT Time Calculation (min) (ACUTE ONLY): 40 min  Charges:    $Gait Training: 8-22 mins $Therapeutic Activity: 23-37 mins PT General Charges $$ ACUTE PT VISIT: 1 Visit                     Silvano Currier, PT  Acute Rehabilitation Services Office [phone] Secure Chat welcomed    Silvano VEAR Currier 03/03/2024, 3:08 PM

## 2024-03-03 NOTE — Assessment & Plan Note (Addendum)
 Patient with documented history of COPD, not on any maintenance medication at baseline. CT PE negative, did demonstrate lower lobe bronchitis, no evidence of pneumonia.  - S/p one albuterol treatment in ED   - Scheduled Duonebs q4h - Continue Anoro Ellipta maintenance inhaler - Echocardiogram, EKG pending for cardiac workup d/t orthopnea on admission  - Daily weights, strict I's/O - Walking test  - Consider PFTs outpatient

## 2024-03-03 NOTE — Progress Notes (Signed)
 Physical Therapy Treatment Note  (See other note of this date for mobility details)  SATURATION QUALIFICATIONS: (This note is used to comply with regulatory documentation for home oxygen)  Patient Saturations on Room Air at Rest = 88-92%  Patient Saturations on Room Air while Ambulating = 85%  Patient Saturations on 2 Liters of oxygen while Ambulating = 94%  Please briefly explain why patient needs home oxygen: Patient requires supplemental oxygen to maintain oxygen saturations at acceptable, safe levels with physical activity.   Silvano Currier, PT  Acute Rehabilitation Services Office [phone] Secure Chat welcomed

## 2024-03-03 NOTE — Progress Notes (Signed)
 SATURATION QUALIFICATIONS: (This note is used to comply with regulatory documentation for home oxygen)  Patient Saturations on Room Air at Rest = 86%  Patient Saturations on 1L while Ambulating = 86%  Patient Saturations on 2 Liters of oxygen while Ambulating = 92%  Please briefly explain why patient needs home oxygen: pt has COPD

## 2024-03-03 NOTE — Progress Notes (Signed)
  Echocardiogram 2D Echocardiogram has been performed.  [name] 03/03/2024, 5:43 PM

## 2024-03-03 NOTE — TOC Initial Note (Addendum)
 Transition of Care [facility]) - Initial/Assessment Note    Patient Details  Name: [name] MRN: [id number] Date of Birth: 1940-06-14  Transition of [facility]) CM/SW Contact:    Lauraine FORBES Saa, LCSWA Phone Number: 03/03/2024, 2:17 PM  Clinical Narrative:   2:17 PM CSW introduced self and role to patient. Patient's daughter in law, Delon, was also present. Patient consented CSW to speak to and in front of [place]. Patient and Delon confirmed preferences in [facility] and [facility] SNFs; however, expressed interest in patient discharging home with [facility] vs to SNF if able. CSW provided patient SNF Medicare ratings and informed medical team of interests. PT confirmed patient could discharge home with [facility]. Patient and Delon informed CSW that patient lives in a handicapped accessible apartment where she has DME (cane, rollator, RW, shower chair). TOC will continue to follow.  3:21 PM RNCM made CSW aware that patient is now agreeable with SNF and remains interested in [facility] SNF as first choice, [facility] SNF as second choice. Patient made aware of [facility] SNF bed offer and accepted bed offer. SNF made aware who confirmed they could admit patient tomorrow pending SNF insurance authorization approval. Medical team and Delon made aware. CSW submitted SNF insurance authorization which is currently pending ([id number]). CSW will continue to follow.   Expected Discharge Plan: Home w Home Health Services Barriers to Discharge: Continued Medical Work up   Patient Goals and CMS Choice Patient states their goals for this hospitalization and ongoing recovery are:: to return home with [facility]          Expected Discharge Plan and Services In-house Referral: Clinical Social Work Discharge Planning Services: CM Consult Post Acute Care Choice: Home Health Living arrangements for the past 2 months: Single Family Home                                       Prior Living Arrangements/Services Living arrangements for the past 2 months: Single Family Home Lives with:: Self Patient language and need for interpreter reviewed:: Yes Do you feel safe going back to the place where you live?: Yes      Need for Family Participation in Patient Care: No (Comment)   Current home services: DME Criminal Activity/Legal Involvement Pertinent to Current Situation/Hospitalization: No - Comment as needed  Activities of Daily Living      Permission Sought/Granted Permission sought to share information with : Family Supports Permission granted to share information with : Yes, Verbal Permission Granted  Share Information with NAME: Delon Ahle     Permission granted to share info w Relationship: Daughter in [place]  Permission granted to share info w Contact Information: [phone]  Emotional Assessment Appearance:: Appears stated age Attitude/Demeanor/Rapport: Engaged Affect (typically observed): Accepting, Appropriate, Adaptable, Calm, Stable, Pleasant Orientation: : Oriented to Place, Oriented to  Time, Oriented to Situation, Oriented to Self Alcohol / Substance Use: Not Applicable Psych Involvement: No (comment)  Admission diagnosis:  Hypoxia [R09.02] Acute pain of left knee [M25.562] Patient Active Problem List   Diagnosis Date Noted   Chronic health problem 03/03/2024   Acute pain of left knee 03/03/2024   Hypoxia 03/02/2024   COPD (chronic obstructive pulmonary disease) (HCC) 03/02/2024   Arthritis of left knee 03/02/2024   Age related osteoporosis 10/11/2023   Choledocholithiasis with obstruction 06/24/2023   Hyponatremia 06/24/2023   Elevated liver function tests  06/24/2023   Hypokalemia 06/24/2023   Hypochloremia 06/24/2023   Nodule of upper lobe of right lung 08/23/2021   Esophageal dysphagia 11/15/2017   Lobular carcinoma of breast (HCC) 10/31/2016   Constipation 05/11/2010   Other dysphagia 05/11/2010   Abdominal pain  05/11/2010   TOBACCO ABUSE 11/04/2008   Cough 09/04/2008   ANXIETY DEPRESSION 08/07/2008   Otitis media 05/07/2008   Osteoarthritis 02/18/2008   Calculus of gallbladder 09/23/2007   Hypoxemia 04/30/2007   Microscopic hematuria 04/16/2007   INCONTINENCE 04/16/2007   History of colonic polyps 03/21/2006   Hypothyroidism 03/21/2006   Hyperlipidemia 03/21/2006   CATARACT NOS 03/21/2006   Essential hypertension 03/21/2006   Allergic rhinitis 03/21/2006   GERD 03/21/2006   Arthropathy 03/21/2006   Disorder of bone and cartilage 03/21/2006   HX, PERSONAL, MALIGNANCY, BREAST 03/21/2006   PCP:  Shona Norleen PEDLAR, MD Pharmacy:   [facility] [address] [place] [address] [place] [address] [place] [address] Phone: [phone] Fax: [phone]  [facility] [address] [place] [address] Phone: [phone] Fax: [phone]     Social Drivers of Health (SDOH) Social History: SDOH Screenings   Food Insecurity: Unknown (03/02/2024)  Housing: Unknown (03/02/2024)  Transportation Needs: Unknown (03/02/2024)  Utilities: Not At Risk (03/02/2024)  Social Connections: Unknown (03/02/2024)  Tobacco Use: High Risk (03/01/2024)   SDOH Interventions: Food Insecurity Interventions: Intervention Not Indicated Housing Interventions: Intervention Not Indicated Transportation Interventions: Intervention Not Indicated Utilities Interventions: Intervention Not Indicated Social Connections Interventions: Intervention Not Indicated   Readmission Risk Interventions     No data to display

## 2024-03-03 NOTE — Telephone Encounter (Addendum)
 Patient Product/process Development Scientist completed.    The patient is insured through [employer]. Patient has Medicare and is not eligible for a copay card, but may be able to apply for patient assistance or Medicare RX Payment Plan (Patient Must reach out to their plan, if eligible for payment plan), if available.    Ran test claim for Anoro Ellipta  and the current 30 day co-pay is $120.24.  Ran test claim for Bevespi  and the current 30 day co-pay is $120.24.  Ran test claim for Stioloto  and Not on Formulary      This test claim was processed through [employer]- copay amounts may vary at other pharmacies due to [employer], or as the patient moves through the different stages of their insurance plan.     Reyes Sharps, CPHT Pharmacy Technician Patient Advocate Specialist [facility] Patient Advocate Team Direct Number: [phone]  Fax: [phone]

## 2024-03-03 NOTE — Progress Notes (Signed)
 Daily Progress Note Intern Pager: [phone]  Patient name: [name] Medical record number: [id number] Date of birth: 11-Sep-1940 Age: 83 y.o. Gender: female  Primary Care Provider: Hurst Norleen PEDLAR, MD Consultants: None Code Status: Full  Pt Overview and Major Events to Date:  11/09: Admitted for hypoxia requiring 2L [place] O2  Medical Decision Making:  [name]. Sieben is an 83yo F admitted for hypoxia requiring supplemental oxygen. Patient originally presented to ED on 11/08 for L knee pain, for which workup has been largely unremarkable, was planning for SNF, when found to have desaturations to mid 80s on ambulation. Pertinent PMH/PSH includes COPD, L knee arthritis, HTN, HLD, anxiety/depression, tobacco use (1 pack every 2 weeks).  Assessment & Plan Hypoxia COPD (chronic obstructive pulmonary disease) (HCC) Patient with documented history of COPD, not on any maintenance medication at baseline. CT PE negative, did demonstrate lower lobe bronchitis, no evidence of pneumonia.  - S/p one albuterol treatment in ED   - Scheduled Duonebs q4h - Continue Anoro Ellipta maintenance inhaler - Echocardiogram, EKG pending for cardiac workup d/t orthopnea on admission  - Daily weights, strict I's/O - Walking test  - Consider PFTs outpatient Arthritis of left knee Known severe arthritis of left knee s/p multiple distant operations following infected tib-fib fracture years ago.  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 - PT/OT eval and treat - Tylenol  650mg  q4h as needed - Voltaren gel as needed Chronic health problem CAD: Continue home 81mg  ASA daily  HTN: Continue home amlodipine 5mg  daily, home carvedilol 3.125 BID, home hydrochlorothiazide 25mg  daily, and home lisinopril 40 mg daily  HLD: Continue home atorvastatin 40mg   daily Hypothyroidism: Continue home levothyroxine  137mcg daily  Seasonal allergies: Continue home loratadine  10mg  daily  GERD: Continue home pantoprazole  40mg  daily   FEN/GI: Regular diet  PPx: Lovenox  Dispo:Home pending clinical improvement .   Subjective:  Patient was seen and evaluated at bedside.  She states she is doing much better but still has some mild shortness of breath.  No overnight events.  Vital signs have remained stable overnight with O2 sats above 94% on 2 L of oxygen.  I was able to wean patient down to 1 L of oxygen during interview and patient did well, speaking to me in clear, full sentences without difficulty or increased work of breathing or increased shortness of breath.  Denies any chest pain.  States her left lower extremity pain is much better than on admission.  Objective: Temp:  [98.2 F (36.8 C)-98.9 F (37.2 C)] 98.2 F (36.8 C) (11/09 1846) Pulse Rate:  [80-94] 94 (11/09 1846) Resp:  [16-18] 18 (11/09 1846) BP: (119-154)/(46-61) 132/60 (11/09 1846) SpO2:  [90 %-94 %] 93 % (11/09 1846) Weight:  [80 kg] 80 kg (11/10 0500) Physical Exam: General: Well-appearing female sitting upright in hospital bed comfortably in no acute distress Cardiovascular: RRR, no M/R/G, 2+ radial pulses Respiratory: Normal work of breathing, diminshed breath sounds, upper airway transmitted noises, speaks in clear full sentences without difficutly Abdomen: Soft, nontender, nondistended, bowel sounds present Extremities: Surgical deformity from to lateral LLE unchanged from previous with mild tenderness to palpation of the LLE, intact sensation bilaterally, moves all extremities equally with FROM  Laboratory: Most recent CBC Lab Results  Component Value Date   WBC 10.7 (H) 03/01/2024   HGB 12.7 03/01/2024   HCT 38.3 03/01/2024  MCV 91.8 03/01/2024   PLT 227 03/01/2024   Most recent BMP    Latest Ref Rng & Units 03/03/2024    4:48 AM  BMP  Glucose 70 - 99 mg/dL 888    BUN 8 - 23 mg/dL 10   Creatinine 9.55 - 1.00 mg/dL 9.32   Sodium 864 - 854 mmol/L 135   Potassium 3.5 - 5.1 mmol/L 3.4   Chloride 98 - 111 mmol/L 97   CO2 22 - 32 mmol/L 25   Calcium 8.9 - 10.3 mg/dL 8.8    Lupie Credit, DO 03/03/2024, 7:24 AM  PGY-1, [facility] Health Family Medicine FPTS Intern pager: [phone], text pages welcome Secure chat group [facility] Teaching Service

## 2024-03-03 NOTE — Assessment & Plan Note (Addendum)
 CAD: Continue home 81mg  ASA daily  HTN: Continue home amlodipine 5mg  daily, home carvedilol 3.125 BID, home hydrochlorothiazide 25mg  daily, and home lisinopril 40 mg daily  HLD: Continue home atorvastatin 40mg  daily Hypothyroidism: Continue home levothyroxine  137mcg daily  Seasonal allergies: Continue home loratadine  10mg  daily  GERD: Continue home pantoprazole  40mg  daily

## 2024-03-03 NOTE — Plan of Care (Signed)
 Received message requesting call to patient's daughter in law. Called and spoke with patient's daughter in law Jesusa) about patient's care plan for today including breathing treatments, COPD maintenance inhaler, and continuing to evaluate her progress/possible dispo. Questions addressed. Daughter in chief operating officer of call.

## 2024-03-03 NOTE — Evaluation (Signed)
 Occupational Therapy Evaluation Patient Details Name: [name] MRN: [id number] DOB: 07-24-1940 Today's Date: 03/03/2024   History of Present Illness   83 y.o. female presents to [facility] hospital on 03/01/2024 with LLE pain, unable to ambulate. PMH includes HLD, anxiety, HTN, OA, COPD.     Clinical Impressions Pt c/o LLE pain, 5/10, feeling better today. Pt lives alone, has family who can assist 1X/day if needed, son helps with groceries, PLOF mod I with cane. Pt currently requires RW for support, ambulated 200 feet with supervision, 2L O2 required to maintain above 88% O2 with ambulation, Pt c/o no SOB. Pt desats to 86% on RA in resting position, requires 1L O2 at rest to maintain above 90% O2. Pt able to complete ADLs with set up, has rollator at home and will be able to gather and transport objects needed to safely complete ADLs as needed. Recommending HHOT follow up, will continue to see acutely to progress as able.      If plan is discharge home, recommend the following:   Assistance with cooking/housework;Assist for transportation     Functional Status Assessment   Patient has had a recent decline in their functional status and demonstrates the ability to make significant improvements in function in a reasonable and predictable amount of time.     Equipment Recommendations   None recommended by OT     Recommendations for Other Services         Precautions/Restrictions   Precautions Precautions: Fall;Other (comment) Recall of Precautions/Restrictions: Intact Precaution/Restrictions Comments: monitor sats Restrictions Weight Bearing Restrictions Per Provider Order: No     Mobility Bed Mobility Overal bed mobility: Modified Independent                  Transfers Overall transfer level: Needs assistance Equipment used: Rolling walker (2 wheels) Transfers: Sit to/from Stand, Bed to chair/wheelchair/BSC Sit to Stand: Supervision     Step pivot  transfers: Supervision     General transfer comment: supervision with RW, assist for O2 canister      Balance Overall balance assessment: Needs assistance Sitting-balance support: No upper extremity supported, Feet supported Sitting balance-Leahy Scale: Good     Standing balance support: Single extremity supported, During functional activity Standing balance-Leahy Scale: Fair Standing balance comment: able to static stand with one hand supported, does well with RW                           ADL either performed or assessed with clinical judgement   ADL Overall ADL's : Needs assistance/impaired                                       General ADL Comments: set up/supervision with RW for support, 2L O2 via [place] during ambulation to maintain above 88% O2 saturation. Pt able to compelte ADLs with set up     Vision Baseline Vision/History: 1 Wears glasses Ability to See in Adequate Light: 0 Adequate Patient Visual Report: No change from baseline       Perception         Praxis         Pertinent Vitals/Pain Pain Assessment Pain Assessment: 0-10 Pain Score: 5  Pain Location: L knee Pain Descriptors / Indicators: Aching Pain Intervention(s): Monitored during session     Extremity/Trunk Assessment Upper Extremity Assessment Upper Extremity Assessment: Overall [facility]  for tasks assessed   Lower Extremity Assessment Lower Extremity Assessment: Defer to PT evaluation       Communication Communication Communication: Impaired Factors Affecting Communication: Hearing impaired   Cognition Arousal: Alert Behavior During Therapy: WFL for tasks assessed/performed Cognition: No apparent impairments                               Following commands: Intact       Cueing  General Comments   Cueing Techniques: Verbal cues  1L O2 at rest to maintain above 90% O2 saturation, 2L O2 with activity to maintain above 88% O2 saturation    Exercises     Shoulder Instructions      Home Living Family/patient expects to be discharged to:: Private residence Living Arrangements: Alone Available Help at Discharge: Family;Available PRN/intermittently Type of Home: Apartment Home Access: Level entry     Home Layout: One level     Bathroom Shower/Tub: Producer, Television/film/video: Handicapped height Bathroom Accessibility: Yes   Home Equipment: Agricultural Consultant (2 wheels);Rollator (4 wheels);Cane - single point;Electric scooter;Shower seat;Grab bars - toilet;Grab bars - tub/shower          Prior Functioning/Environment Prior Level of Function : Independent/Modified Independent;Driving             Mobility Comments: ambulatory with SPC ADLs Comments: has help to clean apartment    OT Problem List: Decreased strength;Decreased range of motion;Decreased activity tolerance;Impaired balance (sitting and/or standing);Pain   OT Treatment/Interventions: Self-care/ADL training;Therapeutic exercise;Energy conservation;DME and/or AE instruction;Therapeutic activities;Patient/family education;Balance training      OT Goals(Current goals can be found in the care plan section)   Acute Rehab OT Goals Patient Stated Goal: to return home OT Goal Formulation: With patient Time For Goal Achievement: 03/17/24 Potential to Achieve Goals: Good   OT Frequency:  Min 2X/week    Co-evaluation              AM-PAC OT 6 Clicks Daily Activity     Outcome Measure Help from another person eating meals?: None Help from another person taking care of personal grooming?: A Little Help from another person toileting, which includes using toliet, bedpan, or urinal?: A Little Help from another person bathing (including washing, rinsing, drying)?: A Little Help from another person to put on and taking off regular upper body clothing?: None Help from another person to put on and taking off regular lower body clothing?: A  Little 6 Click Score: 20   End of Session Equipment Utilized During Treatment: Gait belt;Rolling walker (2 wheels);Oxygen Nurse Communication: Mobility status  Activity Tolerance: Patient tolerated treatment well Patient left: in chair;with call bell/phone within reach  OT Visit Diagnosis: Unsteadiness on feet (R26.81);Other abnormalities of gait and mobility (R26.89);Muscle weakness (generalized) (M62.81);Pain Pain - Right/Left: Left Pain - part of body: Leg                Time: 8966-8883 OT Time Calculation (min): 43 min Charges:  OT General Charges $OT Visit: 1 Visit OT Evaluation $OT Eval Low Complexity: 1 Low OT Treatments $Self Care/Home Management : 8-22 mins $Therapeutic Activity: 8-22 mins  Trevone Prestwood, OTR/L   Elouise JONELLE Bott 03/03/2024, 11:25 AM

## 2024-03-04 DIAGNOSIS — M25562 Pain in left knee: Secondary | ICD-10-CM | POA: Diagnosis not present

## 2024-03-04 DIAGNOSIS — R0902 Hypoxemia: Secondary | ICD-10-CM | POA: Diagnosis not present

## 2024-03-04 LAB — BASIC METABOLIC PANEL WITH GFR
Anion gap: 13 (ref 5–15)
BUN: 11 mg/dL (ref 8–23)
CO2: 25 mmol/L (ref 22–32)
Calcium: 9.1 mg/dL (ref 8.9–10.3)
Chloride: 96 mmol/L — ABNORMAL LOW (ref 98–111)
Creatinine, Ser: 0.56 mg/dL (ref 0.44–1.00)
GFR, Estimated: >60 mL/min (ref >60)
Glucose, Bld: 97 mg/dL (ref 70–99)
Potassium: 4 mmol/L (ref 3.5–5.1)
Sodium: 134 mmol/L — ABNORMAL LOW (ref 135–145)

## 2024-03-04 LAB — ECHOCARDIOGRAM COMPLETE
Area-P 1/2: 6.96 cm2
Height: 63 in
S' Lateral: 3 cm
Weight: 2821.89 [oz_av]

## 2024-03-04 NOTE — Assessment & Plan Note (Addendum)
 Patient with documented history of COPD, not on any maintenance medication at baseline. Doing well today on 1L [place] O2. - Continue Anoro Ellipta maintenance inhaler - Low suspicion for heart failure exacerbation with EKG and echocardiogram within normal limits - Daily weights, strict I's/O - Walking test significant for O2 sats dropping to 86% during ambulation on 1 L O2.  This was increased to 2 L with relief.  Will send patient home on oxygen this admission. - Consider PFTs outpatient

## 2024-03-04 NOTE — Plan of Care (Signed)
  Problem: Education: Goal: Knowledge of General Education information will improve Description: Including pain rating scale, medication(s)/side effects and non-pharmacologic comfort measures Outcome: Progressing   Problem: Clinical Measurements: Goal: Will remain free from infection Outcome: Progressing   Problem: Activity: Goal: Risk for activity intolerance will decrease Outcome: Progressing   Problem: Nutrition: Goal: Adequate nutrition will be maintained Outcome: Progressing   Problem: Elimination: Goal: Will not experience complications related to bowel motility Outcome: Progressing Goal: Will not experience complications related to urinary retention Outcome: Progressing   Problem: Pain Managment: Goal: General experience of comfort will improve and/or be controlled Outcome: Progressing   Problem: Health Behavior/Discharge Planning: Goal: Ability to manage health-related needs will improve Outcome: Not Progressing   Addendum:  05:10: Patient appeared upset when rounding. Patient then expressed that she feels comfortable to get up and use the restroom without the bed alarm and assistance. She is adamant that she is capable. Explained to the patient the fall risk of getting up unattended. Patient stated that she is aware and will make sure to call if she feels incapable of doing so. Refusal of bed alarm at this time. Education was provided.

## 2024-03-04 NOTE — Assessment & Plan Note (Signed)
 CAD: Continue home 81mg  ASA daily  HTN: Continue home amlodipine 5mg  daily, home carvedilol 3.125 BID, home hydrochlorothiazide 25mg  daily, and home lisinopril 40 mg daily  HLD: Continue home atorvastatin 40mg  daily Hypothyroidism: Continue home levothyroxine  137mcg daily  Seasonal allergies: Continue home loratadine  10mg  daily  GERD: Continue home pantoprazole  40mg  daily

## 2024-03-04 NOTE — Progress Notes (Signed)
 Mobility Specialist Progress Note:   03/04/24 1046  Mobility  Activity Ambulated with assistance (In hallway)  Level of Assistance Contact guard assist, steadying assist  Assistive Device Front wheel walker  Distance Ambulated (ft) 250 ft  Activity Response Tolerated well  Mobility Referral Yes  Mobility visit 1 Mobility  Mobility Specialist Start Time (ACUTE ONLY) 1019  Mobility Specialist Stop Time (ACUTE ONLY) 1046  Mobility Specialist Time Calculation (min) (ACUTE ONLY) 27 min   Received pt in chair and agreeable to mobility. Pt on 1 L/ min O2. Pt requested to use BR prior to ambulating in hallway. Pt required MinG for safety. Pt had small BM. Pt c/o LLE pain, otherwise tolerated well. Pt's SPO2 dropped to 86%; increased pt's SPO2 to 2 L/min O2. VSS throughout. Returned to room without fault. Pt left in chair on 1 L/min O2. Personal belongings and call light within reach. All needs met.  Lavanda Pollack Mobility Specialist  Please contact via [employer] or  Rehab Office [phone]

## 2024-03-04 NOTE — Plan of Care (Signed)
  Problem: Education: Goal: Knowledge of General Education information will improve Description: Including pain rating scale, medication(s)/side effects and non-pharmacologic comfort measures Outcome: Progressing   Problem: Health Behavior/Discharge Planning: Goal: Ability to manage health-related needs will improve Outcome: Not Progressing   Problem: Activity: Goal: Risk for activity intolerance will decrease Outcome: Not Progressing

## 2024-03-04 NOTE — Progress Notes (Signed)
 Daily Progress Note Intern Pager: [phone]  Patient name: [name] Medical record number: [id number] Date of birth: 1940/09/18 Age: 83 y.o. Gender: female  Primary Care Provider: Hurst Norleen PEDLAR, MD Consultants: None Code Status: Full  Pt Overview and Major Events to Date:  11/09: Admitted for hypoxia requiring new supplemental oxygen  Medical Decision Making:  [name] is an 83yo F admitted for hypoxia requiring supplemental oxygen. Patient originally presented to ED on 11/08 for L knee pain, for which workup has been largely unremarkable, was planning for SNF, when found to have desaturations to mid 80s on ambulation. Pertinent PMH/PSH includes COPD, L knee arthritis, HTN, HLD, anxiety/depression, tobacco use (1 pack every 2 weeks).  Barriers to discharge include SNF placement and DME order for oxygen. Assessment & Plan Hypoxia COPD (chronic obstructive pulmonary disease) (HCC) Patient with documented history of COPD, not on any maintenance medication at baseline. Doing well today on 1L [place] O2. - Continue Anoro Ellipta maintenance inhaler - Low suspicion for heart failure exacerbation with EKG and echocardiogram within normal limits - Daily weights, strict I's/O - Walking test significant for O2 sats dropping to 86% during ambulation on 1 L O2.  This was increased to 2 L with relief.  Will send patient home on oxygen this admission. - Consider PFTs outpatient Arthritis of left knee Known severe arthritis of left knee s/p multiple distant operations.  No complaints today, pain controlled adequately. - PT/OT eval and treat - Continue pain control with Tylenol  650mg  q4h and Voltaren gel as needed Chronic health problem CAD: Continue home 81mg  ASA daily  HTN: Continue home amlodipine 5mg  daily, home carvedilol 3.125 BID, home hydrochlorothiazide 25mg  daily, and home lisinopril 40 mg daily  HLD: Continue home atorvastatin 40mg  daily Hypothyroidism: Continue home  levothyroxine  137mcg daily  Seasonal allergies: Continue home loratadine  10mg  daily  GERD: Continue home pantoprazole  40mg  daily   FEN/GI: Regular diet PPx: Lovenox  Dispo:SNF pending bed placement.   Subjective:  Patient was seen and evaluated at bedside.  There were no overnight events.  Patient states that she is doing well, denies any shortness of breath, chest pain, leg pain, or any other complaints at this time.  She is currently on 1 L nasal cannula oxygen at rest.  She had just finished breakfast as I walked in for evaluation.  Objective: Temp:  [98.1 F (36.7 C)-99.6 F (37.6 C)] 98.6 F (37 C) (11/11 0501) Pulse Rate:  [76-99] 76 (11/11 0501) Resp:  [16-20] 16 (11/11 0501) BP: (99-163)/(47-62) 148/53 (11/11 0501) SpO2:  [85 %-95 %] 94 % (11/11 0501) Weight:  [75.6 kg] 75.6 kg (11/11 0500) Physical Exam: General: Comfortably sleeping in recliner in no acute distress with normal work of breathing on 1 L nasal cannula, easily able to wake Cardiovascular: RRR, no M/R/G, 2+ radial pulses Respiratory: Good respiratory effort, soft rhonchi to bilateral lower lobes left greater than right, no wheezes or rales, normal work of breathing Abdomen: Soft, nontender, nondistended, bowel sounds present Extremities: Surgical deformity to lateral LLE unchanged from previous with mild tenderness to palpation of the LLE, moves all extremities equally with FROM   Laboratory: Most recent CBC Lab Results  Component Value Date   WBC 10.7 (H) 03/01/2024   HGB 12.7 03/01/2024   HCT 38.3 03/01/2024   MCV 91.8 03/01/2024   PLT 227 03/01/2024   Most recent BMP    Latest Ref Rng & Units 03/04/2024    5:04 AM  BMP  Glucose 70 - 99 mg/dL 97   BUN 8 - 23 mg/dL 11   Creatinine 9.55 - 1.00 mg/dL 9.43   Sodium 864 - 854 mmol/L 134   Potassium 3.5 - 5.1 mmol/L 4.0   Chloride 98 - 111 mmol/L 96   CO2 22 - 32 mmol/L 25   Calcium 8.9 - 10.3 mg/dL 9.1    Imaging/Diagnostic Tests: Echocardiogram  Impression 11/10: LVEF 60 to 65%, normal function LV, no regional wall motion abnormalities, RV normal, left atrial size mildly dilated, IVC dilated with greater than 50% respiratory variability suggesting right atrial pressure of 8 mmHg.  EKG 11/10: Normal sinus rhythm  Susan Credit, DO 03/04/2024, 8:05 AM  PGY-1, [facility] Health Family Medicine FPTS Intern pager: [phone], text pages welcome Secure chat group [facility] Teaching Service

## 2024-03-04 NOTE — Assessment & Plan Note (Addendum)
 Known severe arthritis of left knee s/p multiple distant operations.  No complaints today, pain controlled adequately. - PT/OT eval and treat - Continue pain control with Tylenol  650mg  q4h and Voltaren gel as needed

## 2024-03-04 NOTE — TOC Progression Note (Addendum)
 Transition of Care [facility]) - Progression Note    Patient Details  Name: [name] MRN: [id number] Date of Birth: 08-22-1940  [facility] CM/SW Contact  Luann SHAUNNA [place], [place] Phone Number: 03/04/2024, 9:08 AM  Clinical Narrative:     SNF auth still pending at this time.   1605: SNF auth still pending at this time  Expected Discharge Plan: Home w Home Health Services Barriers to Discharge: Continued Medical Work up SNF authorization               Expected Discharge Plan and Services In-house Referral: Clinical Social Work Discharge Planning Services: CM Consult Post Acute Care Choice: Home Health Living arrangements for the past 2 months: Single Family Home                                       Social Drivers of Health (SDOH) Interventions SDOH Screenings   Food Insecurity: Unknown (03/02/2024)  Housing: Unknown (03/02/2024)  Transportation Needs: Unknown (03/02/2024)  Utilities: Not At Risk (03/02/2024)  Social Connections: Unknown (03/02/2024)  Tobacco Use: High Risk (03/01/2024)    Readmission Risk Interventions     No data to display

## 2024-03-05 DIAGNOSIS — J441 Chronic obstructive pulmonary disease with (acute) exacerbation: Secondary | ICD-10-CM

## 2024-03-05 DIAGNOSIS — R1310 Dysphagia, unspecified: Secondary | ICD-10-CM | POA: Diagnosis not present

## 2024-03-05 DIAGNOSIS — K219 Gastro-esophageal reflux disease without esophagitis: Secondary | ICD-10-CM | POA: Diagnosis not present

## 2024-03-05 DIAGNOSIS — M6281 Muscle weakness (generalized): Secondary | ICD-10-CM | POA: Diagnosis not present

## 2024-03-05 DIAGNOSIS — R41841 Cognitive communication deficit: Secondary | ICD-10-CM | POA: Diagnosis not present

## 2024-03-05 DIAGNOSIS — Z860100 Personal history of colon polyps, unspecified: Secondary | ICD-10-CM | POA: Diagnosis not present

## 2024-03-05 DIAGNOSIS — M4854XA Collapsed vertebra, not elsewhere classified, thoracic region, initial encounter for fracture: Secondary | ICD-10-CM | POA: Diagnosis not present

## 2024-03-05 DIAGNOSIS — I10 Essential (primary) hypertension: Secondary | ICD-10-CM | POA: Diagnosis not present

## 2024-03-05 DIAGNOSIS — R7303 Prediabetes: Secondary | ICD-10-CM | POA: Diagnosis not present

## 2024-03-05 DIAGNOSIS — F17200 Nicotine dependence, unspecified, uncomplicated: Secondary | ICD-10-CM | POA: Diagnosis not present

## 2024-03-05 DIAGNOSIS — E785 Hyperlipidemia, unspecified: Secondary | ICD-10-CM | POA: Diagnosis not present

## 2024-03-05 DIAGNOSIS — M810 Age-related osteoporosis without current pathological fracture: Secondary | ICD-10-CM | POA: Diagnosis not present

## 2024-03-05 DIAGNOSIS — Z23 Encounter for immunization: Secondary | ICD-10-CM

## 2024-03-05 DIAGNOSIS — R911 Solitary pulmonary nodule: Secondary | ICD-10-CM | POA: Diagnosis present

## 2024-03-05 DIAGNOSIS — Z853 Personal history of malignant neoplasm of breast: Secondary | ICD-10-CM | POA: Diagnosis not present

## 2024-03-05 DIAGNOSIS — Z720 Tobacco use: Secondary | ICD-10-CM | POA: Diagnosis not present

## 2024-03-05 DIAGNOSIS — M1712 Unilateral primary osteoarthritis, left knee: Secondary | ICD-10-CM | POA: Diagnosis not present

## 2024-03-05 DIAGNOSIS — H9190 Unspecified hearing loss, unspecified ear: Secondary | ICD-10-CM | POA: Diagnosis not present

## 2024-03-05 DIAGNOSIS — E039 Hypothyroidism, unspecified: Secondary | ICD-10-CM | POA: Diagnosis not present

## 2024-03-05 DIAGNOSIS — J309 Allergic rhinitis, unspecified: Secondary | ICD-10-CM | POA: Diagnosis not present

## 2024-03-05 DIAGNOSIS — F17210 Nicotine dependence, cigarettes, uncomplicated: Secondary | ICD-10-CM | POA: Diagnosis not present

## 2024-03-05 DIAGNOSIS — Z808 Family history of malignant neoplasm of other organs or systems: Secondary | ICD-10-CM | POA: Diagnosis not present

## 2024-03-05 DIAGNOSIS — J3089 Other allergic rhinitis: Secondary | ICD-10-CM | POA: Diagnosis not present

## 2024-03-05 DIAGNOSIS — M546 Pain in thoracic spine: Secondary | ICD-10-CM | POA: Diagnosis not present

## 2024-03-05 DIAGNOSIS — M5459 Other low back pain: Secondary | ICD-10-CM | POA: Diagnosis not present

## 2024-03-05 DIAGNOSIS — M25562 Pain in left knee: Secondary | ICD-10-CM | POA: Diagnosis not present

## 2024-03-05 DIAGNOSIS — F411 Generalized anxiety disorder: Secondary | ICD-10-CM | POA: Diagnosis not present

## 2024-03-05 DIAGNOSIS — F341 Dysthymic disorder: Secondary | ICD-10-CM | POA: Diagnosis not present

## 2024-03-05 DIAGNOSIS — Z803 Family history of malignant neoplasm of breast: Secondary | ICD-10-CM | POA: Diagnosis not present

## 2024-03-05 DIAGNOSIS — J449 Chronic obstructive pulmonary disease, unspecified: Secondary | ICD-10-CM | POA: Diagnosis not present

## 2024-03-05 DIAGNOSIS — R262 Difficulty in walking, not elsewhere classified: Secondary | ICD-10-CM | POA: Diagnosis not present

## 2024-03-05 DIAGNOSIS — R0902 Hypoxemia: Secondary | ICD-10-CM | POA: Diagnosis not present

## 2024-03-05 MED ORDER — PNEUMOCOCCAL 20-VAL CONJ VACC 0.5 ML IM SUSY
0.5000 mL | PREFILLED_SYRINGE | INTRAMUSCULAR | Status: DC
  Filled 2024-03-05: qty 0.5

## 2024-03-05 MED ORDER — INFLUENZA VAC SPLIT HIGH-DOSE 0.5 ML IM SUSY
0.5000 mL | PREFILLED_SYRINGE | Freq: Once | INTRAMUSCULAR | Status: DC
  Filled 2024-03-05: qty 0.5

## 2024-03-05 MED ORDER — ALBUTEROL SULFATE HFA 108 (90 BASE) MCG/ACT IN AERS: 2.0000 | INHALATION_SPRAY | RESPIRATORY_TRACT | Status: DC | PRN

## 2024-03-05 MED ORDER — DICLOFENAC SODIUM 1 % EX GEL: 2.0000 g | Freq: Four times a day (QID) | CUTANEOUS | Status: DC | PRN

## 2024-03-05 MED ORDER — UMECLIDINIUM-VILANTEROL 62.5-25 MCG/ACT IN AEPB: 1.0000 | INHALATION_SPRAY | Freq: Every day | RESPIRATORY_TRACT | Status: DC

## 2024-03-05 MED ORDER — IPRATROPIUM-ALBUTEROL 0.5-2.5 (3) MG/3ML IN SOLN: 3.0000 mL | RESPIRATORY_TRACT | Status: DC | PRN

## 2024-03-05 NOTE — Discharge Summary (Addendum)
 Family Medicine Teaching [facility] Discharge Summary  Patient name: [name] Medical record number: [id number] Date of birth: 18-Dec-1940 Age: 83 y.o. Gender: female Date of Admission: 03/01/2024  Date of Discharge: 03/05/2024 Admitting Physician: Otto ONEIDA Fairly, MD  Primary Care Provider: Hurst Norleen PEDLAR, MD Consultants: None  Indication for Hospitalization: Hypoxia with new oxygen requirement  Brief Hospital Course:  [name] is a 83 y.o.female with a history of COPD, tobacco use, HTN, HLD, left knee arthritis, anxiety/depression who was admitted to the family medicine teaching Service at [facility] for hypoxia requiring new supplemental oxygen. Her hospital course is detailed below:  Hypoxia COPD (chronic obstructive pulmonary disease)  Earlean originally presented to the ED with complaint of inability to bear weight on left leg from severe arthritis of left knee.  Upon discharge from ED, patient was found to have a new oxygen requirement since she was desatting to mid 80s during ambulation.  In the ED, patient workup for left knee pain was largely unremarkable for acute findings.  DVT and PE workup negative.  She received an albuterol breathing treatment and admitted.  Cardiac workup unremarkable for heart failure exacerbation.  Hospital course was uneventful, the patient was gradually weaned from 2 L of nasal cannula oxygen to 1 L and was able to be weaned down to room air on discharge.  Patient worked well with PT/OT and was able to be weaned down to room air during ambulation, not requiring any supplemental oxygen on discharge.  Arthritis of left knee Patient worked well with PT/OT throughout hospital course.  She was given Tylenol  and Voltaren gel as needed for pain which was well-controlled.  Other chronic conditions were medically managed with home medications and formulary alternatives as necessary (prediabetes, HTN, hypothyroidism, GERD, anxiety, pulmonary nodule,  tobacco use)  PCP Follow-up Recommendations: Consider PFTs Discuss smoking cessation Outpatient PT for left knee pain   Discharge Diagnoses/Problem List:  Hospital Problems      Hospital     * (Principal) Hypoxia     COPD (chronic obstructive pulmonary disease) (HCC)     Arthritis of left knee     Chronic health problem     Acute pain of left knee     Immunization due   Disposition: SNF  Discharge Condition: Stable  Discharge Exam: General: Comfortably sitting up in recliner in no acute distress with normal work of breathing on RA Cardiovascular: RRR, no M/R/G, 2+ radial pulses Respiratory: Good respiratory effort, soft mild rhonchi (improvement since yesterday) to bilateral lower lobes left greater than right, mild inspiratory wheezing throughout which clear with cough, no rales, normal work of breathing on RA Abdomen: Soft, nontender, nondistended, bowel sounds present Extremities: Surgical deformity to lateral LLE unchanged from previous with mild tenderness to palpation of the LLE at baseline, moves all extremities equally with FROM, no overlying skin changes  Significant Labs and Imaging:  No results for input(s): WBC, HGB, HCT, PLT in the last 48 hours. Recent Labs  Lab 03/04/24 0504  NA 134*  K 4.0  CL 96*  CO2 25  GLUCOSE 97  BUN 11  CREATININE 0.56  CALCIUM 9.1   Discharge Medications:  Allergies as of 03/05/2024       Reactions   Sulfa Antibiotics Anaphylaxis        Medication List     TAKE these medications    acetaminophen  500 MG tablet Commonly known as: TYLENOL  Take 1,000 mg by mouth daily as needed (for pain).  albuterol 108 (90 Base) MCG/ACT inhaler Commonly known as: VENTOLIN HFA Inhale 2-4 puffs into the lungs every 4 (four) hours as needed for wheezing (or cough).   amLODipine 5 MG tablet Commonly known as: NORVASC Take 5 mg by mouth daily.   aspirin EC 81 MG tablet Take 81 mg by mouth daily.   atorvastatin 40 MG  tablet Commonly known as: LIPITOR Take 40 mg by mouth daily.   carvedilol 3.125 MG tablet Commonly known as: COREG Take 3.125 mg by mouth in the morning and at bedtime.   cetirizine 10 MG tablet Commonly known as: ZYRTEC Take 10 mg by mouth at bedtime.   clonazePAM  0.5 MG tablet Commonly known as: KLONOPIN  Take 0.25 mg by mouth See admin instructions. Take 0.25 mg by mouth at bedtime and an additional 0.25 mg once a day as needed for anxiety   diclofenac Sodium 1 % Gel Commonly known as: VOLTAREN Apply 2 g topically 4 (four) times daily as needed (pain).   hydrochlorothiazide 25 MG tablet Commonly known as: HYDRODIURIL Take 25 mg by mouth daily.   levothyroxine  137 MCG tablet Commonly known as: SYNTHROID  Take 137 mcg by mouth daily before breakfast.   lisinopril 40 MG tablet Commonly known as: ZESTRIL Take 40 mg by mouth daily.   pantoprazole  40 MG tablet Commonly known as: PROTONIX  Take 40 mg by mouth daily before breakfast.   PreserVision AREDS 2 Caps Take 1 capsule by mouth in the morning and at bedtime.   Tums 500 MG chewable tablet Generic drug: calcium carbonate Chew 1-2 tablets by mouth daily.   umeclidinium-vilanterol 62.5-25 MCG/ACT Aepb Commonly known as: ANORO ELLIPTA Inhale 1 puff into the lungs daily. Start taking on: March 06, 2024        Discharge Instructions: Please refer to Patient Instructions section of EMR for full details.  Patient was counseled important signs and symptoms that should prompt return to medical care, changes in medications, dietary instructions, activity restrictions, and follow up appointments.   Follow-Up Appointments:  Contact information for after-discharge care     Destination     [facility] .   Service: Skilled Nursing Contact information: [address] [place]  [phone]                    Lupie Credit, DO 03/05/2024, 12:05 PM PGY-1, [facility]  Family Medicine  I have reviewed the above note, agree with its content, and have made the appropriate changes.   Damien Pinal, DO [facility], PGY-3

## 2024-03-05 NOTE — Assessment & Plan Note (Addendum)
 Patient with documented history of COPD, not on any maintenance medication at baseline. Doing well today on room air without difficulty. - Continue Anoro Ellipta maintenance inhaler - Low suspicion for heart failure exacerbation with EKG and echocardiogram within normal limits - Daily weights, strict I's/O - OT recommendations  - Doing well with walking, satting 91-94% without oxygen during OT  - Will not d/c with O2 at this time - Consider PFTs outpatient

## 2024-03-05 NOTE — Assessment & Plan Note (Signed)
 Needs flu and pneumococcal.  - Will administer on d/c

## 2024-03-05 NOTE — Progress Notes (Signed)
 Daily Progress Note Intern Pager: [phone]  Patient name: [name] Medical record number: [id number] Date of birth: 1940-10-07 Age: 83 y.o. Gender: female  Primary Care Provider: Hurst Norleen PEDLAR, MD Consultants: None Code Status: Full  Pt Overview and Major Events to Date:  11/09: Admitted for hypoxia requiring new supplemental oxygen  11/11: Medically cleared for discharge, awaiting SNF placement  Assessment and Plan:  [name] is an 83yo F admitted for hypoxia requiring supplemental oxygen. Patient originally presented to ED on 11/08 for L knee pain, for which workup has been largely unremarkable, was planning for SNF, when found to have desaturations to mid 80s on ambulation. Pertinent PMH/PSH includes COPD, L knee arthritis, HTN, HLD, anxiety/depression, tobacco use (1 pack every 2 weeks).  Assessment & Plan Hypoxia COPD (chronic obstructive pulmonary disease) (HCC) Patient with documented history of COPD, not on any maintenance medication at baseline. Doing well today on room air without difficulty. - Continue Anoro Ellipta maintenance inhaler - Low suspicion for heart failure exacerbation with EKG and echocardiogram within normal limits - Daily weights, strict I's/O - OT recommendations  - Doing well with walking, satting 91-94% without oxygen during OT  - Will not d/c with O2 at this time - Consider PFTs outpatient Arthritis of left knee Known severe arthritis of left knee s/p multiple distant operations.  No complaints today, pain controlled adequately. - PT/OT eval and treat - Continue pain control with Tylenol  650mg  q4h and Voltaren gel as needed Immunization due Needs flu and pneumococcal.  - Will administer on d/c  Chronic health problem CAD: Continue home 81mg  ASA daily  HTN: Continue home amlodipine 5mg  daily, home carvedilol 3.125 BID, home hydrochlorothiazide 25mg  daily, and home lisinopril 40 mg daily  HLD: Continue home atorvastatin 40mg   daily Hypothyroidism: Continue home levothyroxine  137mcg daily  Seasonal allergies: Continue home loratadine  10mg  daily  GERD: Continue home pantoprazole  40mg  daily   FEN/GI: Regular diet  PPx: Lovenox   Dispo:SNF pending bed placement.   Subjective:  Patient was seen and evaluated at bedside. There were no overnight events. The patient has been on RA since 8pm last night, satting 90% and VS remained stable overnight. Pt states she is feeling great today, noticed productive cough since using incentive spirometer. Advised to continue to spit out mucus as it gets coughed up. Looking well, interactive and telling me long story without breathing difficulty.   Objective: Temp:  [98.1 F (36.7 C)-98.5 F (36.9 C)] 98.1 F (36.7 C) (11/12 0452) Pulse Rate:  [73-83] 77 (11/12 0452) Resp:  [17-18] 17 (11/12 0452) BP: (134-141)/(55-64) 135/55 (11/12 0452) SpO2:  [90 %-98 %] 90 % (11/12 0452) Weight:  [75.8 kg] 75.8 kg (11/12 0500) Physical Exam: General: Comfortably sitting up in recliner in no acute distress with normal work of breathing on RA Cardiovascular: RRR, no M/R/G, 2+ radial pulses Respiratory: Good respiratory effort, soft mild rhonchi (improvement since yesterday) to bilateral lower lobes left greater than right, mild inspiratory wheezing throughout which clear with cough, no rales, normal work of breathing on RA Abdomen: Soft, nontender, nondistended, bowel sounds present Extremities: Surgical deformity to lateral LLE unchanged from previous with mild tenderness to palpation of the LLE at baseline, moves all extremities equally with FROM, no overlying skin changes   Laboratory: Most recent CBC Lab Results  Component Value Date   WBC 10.7 (H) 03/01/2024   HGB 12.7 03/01/2024   HCT 38.3 03/01/2024   MCV 91.8 03/01/2024   PLT 227  03/01/2024   Most recent BMP    Latest Ref Rng & Units 03/04/2024    5:04 AM  BMP  Glucose 70 - 99 mg/dL 97   BUN 8 - 23 mg/dL 11   Creatinine  9.55 - 1.00 mg/dL 9.43   Sodium 864 - 854 mmol/L 134   Potassium 3.5 - 5.1 mmol/L 4.0   Chloride 98 - 111 mmol/L 96   CO2 22 - 32 mmol/L 25   Calcium 8.9 - 10.3 mg/dL 9.1    Lupie Credit, DO 03/05/2024, 7:27 AM  PGY-1, [facility] FPTS Intern pager: [phone], text pages welcome Secure chat group [facility] Teaching Service

## 2024-03-05 NOTE — Assessment & Plan Note (Signed)
 Known severe arthritis of left knee s/p multiple distant operations.  No complaints today, pain controlled adequately. - PT/OT eval and treat - Continue pain control with Tylenol  650mg  q4h and Voltaren gel as needed

## 2024-03-05 NOTE — TOC Transition Note (Addendum)
 Transition of Care [facility]) - Discharge Note   Patient Details  Name: [name] MRN: [id number] Date of Birth: 05-26-1940  Transition of Care [facility]) CM/SW Contact:  Lauraine FORBES Saa, LCSWA Phone Number: 03/05/2024, 10:29 AM   Clinical Narrative:     Patient will DC to: [facility] Anticipated DC date: 03/05/2024 Family notified: Delon Ahle; Daughter in [place]; [phone] Transport by: Delon Ahle; Daughter in [place]; [phone]   Patient's SNF insurance authorization was approved (Cert# [id number]) and is valid 11/11-11/18. Per MD patient ready for DC to [facility]. RN to call report prior to discharge [phone]). RN, patient, patient's family, and facility notified of DC. Discharge Summary and FL2 sent to facility. Patient's daughter in law, Delon, to provide patient transportation to SNF.  CSW will sign off for now as social work intervention is no longer needed. Please consult us  again if new needs arise.    Final next level of care: Skilled Nursing Facility Barriers to Discharge: Barriers Resolved   Patient Goals and CMS Choice Patient states their goals for this hospitalization and ongoing recovery are:: SNF          Discharge Placement              Patient chooses bed at: [facility] Patient to be transferred to facility by: Delon Ahle; Daughter in [place]; [phone] Name of family member notified: Delon Ahle; Daughter in [place]; [phone] Patient and family notified of of transfer: 03/05/24  Discharge Plan and Services Additional resources added to the After Visit Summary for   In-house Referral: Clinical Social Work Discharge Planning Services: CM Consult Post Acute Care Choice: Home Health                               Social Drivers of Health (SDOH) Interventions SDOH Screenings   Food Insecurity: Unknown (03/02/2024)  Housing: Unknown (03/02/2024)  Transportation Needs: Unknown (03/02/2024)   Utilities: Not At Risk (03/02/2024)  Social Connections: Unknown (03/02/2024)  Tobacco Use: High Risk (03/01/2024)     Readmission Risk Interventions     No data to display

## 2024-03-05 NOTE — Care Management Important Message (Signed)
 Important Message  Patient Details  Name: [name] MRN: [id number] Date of Birth: [birth year]   Important Message Given:  Yes - Medicare IM  Patient left prior to IM delivery will send patient copy  to the home address.  Sojourner Behringer 03/05/2024, 3:45 PM

## 2024-03-05 NOTE — Plan of Care (Signed)
  Problem: Acute Rehab PT Goals(only PT should resolve) Goal: Pt Will Go Supine/Side To Sit Outcome: Adequate for Discharge Goal: Pt Will Go Sit To Supine/Side Outcome: Adequate for Discharge Goal: Patient Will Transfer Sit To/From Stand Outcome: Adequate for Discharge Goal: Pt Will Ambulate Outcome: Adequate for Discharge Goal: Pt/caregiver will Perform Home Exercise Program Outcome: Adequate for Discharge   Problem: Education: Goal: Knowledge of General Education information will improve Description: Including pain rating scale, medication(s)/side effects and non-pharmacologic comfort measures Outcome: Adequate for Discharge   Problem: Health Behavior/Discharge Planning: Goal: Ability to manage health-related needs will improve Outcome: Adequate for Discharge   Problem: Clinical Measurements: Goal: Ability to maintain clinical measurements within normal limits will improve Outcome: Adequate for Discharge Goal: Will remain free from infection Outcome: Adequate for Discharge Goal: Diagnostic test results will improve Outcome: Adequate for Discharge Goal: Respiratory complications will improve Outcome: Adequate for Discharge Goal: Cardiovascular complication will be avoided Outcome: Adequate for Discharge   Problem: Activity: Goal: Risk for activity intolerance will decrease Outcome: Adequate for Discharge   Problem: Nutrition: Goal: Adequate nutrition will be maintained Outcome: Adequate for Discharge   Problem: Coping: Goal: Level of anxiety will decrease Outcome: Adequate for Discharge   Problem: Elimination: Goal: Will not experience complications related to bowel motility Outcome: Adequate for Discharge Goal: Will not experience complications related to urinary retention Outcome: Adequate for Discharge   Problem: Pain Managment: Goal: General experience of comfort will improve and/or be controlled Outcome: Adequate for Discharge   Problem: Safety: Goal:  Ability to remain free from injury will improve Outcome: Adequate for Discharge   Problem: Skin Integrity: Goal: Risk for impaired skin integrity will decrease Outcome: Adequate for Discharge   Problem: Acute Rehab OT Goals (only OT should resolve) Goal: Pt. Will Perform Upper Body Dressing Outcome: Adequate for Discharge Goal: Pt. Will Perform Lower Body Dressing Outcome: Adequate for Discharge Goal: Pt. Will Transfer To Toilet Outcome: Adequate for Discharge Goal: Pt. Will Perform Toileting-Clothing Manipulation Outcome: Adequate for Discharge

## 2024-03-05 NOTE — Progress Notes (Signed)
 Occupational Therapy Treatment Patient Details Name: [name] MRN: [id number] DOB: Oct 08, 1940 Today's Date: 03/05/2024   History of present illness 83 y.o. female presents to [facility] hospital on 03/01/2024 with LLE pain, unable to ambulate. PMH includes HLD, anxiety, HTN, OA, COPD.   OT comments  Pt presented in recliner and motivated to continue to work with therapy and wants to go to SNF setting. At this time she completed toileting tasks with supervision to CGA with RW. She then completed oral care while in standing with supervision to CGA with cues on positioning. She then agreed to ambulate with RW with supervision with min-mod cues on positioning to increase in breathing techniques. Pt in session o2 on RA was 91-94% with no reports of SOB. Patient will benefit from continued inpatient follow up therapy, <3 hours/day as pt lives alone and needs to be able to complete ADLs/IADLS independently with the return to home.        If plan is discharge home, recommend the following:  Assistance with cooking/housework;Assist for transportation;A little help with bathing/dressing/bathroom;A little help with walking and/or transfers   Equipment Recommendations  None recommended by OT    Recommendations for Other Services      Precautions / Restrictions Precautions Precautions: Fall;Other (comment) Recall of Precautions/Restrictions: Intact Precaution/Restrictions Comments: monitor sats Restrictions Weight Bearing Restrictions Per Provider Order: No       Mobility Bed Mobility               General bed mobility comments: pt presented in recliner    Transfers Overall transfer level: Needs assistance Equipment used: Rolling walker (2 wheels) Transfers: Sit to/from Stand Sit to Stand: Supervision, Contact guard assist           General transfer comment: Pt intially needed closer CGA due to L knee stability/stiffness but once ambulating noted decrease in discomfort      Balance Overall balance assessment: Needs assistance Sitting-balance support: Feet supported Sitting balance-Leahy Scale: Good     Standing balance support: Bilateral upper extremity supported, Single extremity supported, No upper extremity supported Standing balance-Leahy Scale: Fair Standing balance comment: able to complete oral care in standing but occ needed unlateral support                           ADL either performed or assessed with clinical judgement   ADL Overall ADL's : Needs assistance/impaired Eating/Feeding: Independent;Sitting   Grooming: Wash/dry hands;Wash/dry face;Oral care;Supervision/safety;Standing   Upper Body Bathing: Set up;Sitting   Lower Body Bathing: Supervison/ safety;Contact guard assist;Sit to/from stand   Upper Body Dressing : Supervision/safety;Sitting   Lower Body Dressing: Supervision/safety;Contact guard assist;Sit to/from stand   Toilet Transfer: Supervision/safety;Contact guard assist;Rolling walker (2 wheels)   Toileting- Clothing Manipulation and Hygiene: Supervision/safety;Contact guard assist;Sit to/from stand       Functional mobility during ADLs: Supervision/safety;Contact guard assist;Rolling walker (2 wheels);Cueing for sequencing;Cueing for safety      Extremity/Trunk Assessment Upper Extremity Assessment Upper Extremity Assessment: Overall WFL for tasks assessed   Lower Extremity Assessment Lower Extremity Assessment: Defer to PT evaluation        Vision       Perception     Praxis     Communication Communication Communication: Impaired Factors Affecting Communication: Hearing impaired   Cognition Arousal: Alert Behavior During Therapy: WFL for tasks assessed/performed Cognition: No apparent impairments  Following commands: Intact        Cueing   Cueing Techniques: Verbal cues  Exercises      Shoulder Instructions       General Comments       Pertinent Vitals/ Pain       Pain Assessment Pain Assessment: Faces Faces Pain Scale: Hurts a little bit Pain Location: L knee Pain Descriptors / Indicators: Aching Pain Intervention(s): Limited activity within patient's tolerance, Monitored during session, Repositioned  Home Living                                          Prior Functioning/Environment              Frequency  Min 2X/week        Progress Toward Goals  OT Goals(current goals can now be found in the care plan section)  Progress towards OT goals: Progressing toward goals  Acute Rehab OT Goals Patient Stated Goal: to go to rehab OT Goal Formulation: With patient Time For Goal Achievement: 03/17/24 Potential to Achieve Goals: Good ADL Goals Pt Will Perform Upper Body Dressing: with modified independence Pt Will Perform Lower Body Dressing: with modified independence Pt Will Transfer to Toilet: with modified independence Pt Will Perform Toileting - Clothing Manipulation and hygiene: with modified independence  Plan      Co-evaluation                 AM-PAC OT 6 Clicks Daily Activity     Outcome Measure   Help from another person eating meals?: None Help from another person taking care of personal grooming?: A Little Help from another person toileting, which includes using toliet, bedpan, or urinal?: A Little Help from another person bathing (including washing, rinsing, drying)?: A Little Help from another person to put on and taking off regular upper body clothing?: None Help from another person to put on and taking off regular lower body clothing?: A Little 6 Click Score: 20    End of Session Equipment Utilized During Treatment: Gait belt;Rolling walker (2 wheels)  OT Visit Diagnosis: Unsteadiness on feet (R26.81);Other abnormalities of gait and mobility (R26.89);Muscle weakness (generalized) (M62.81);Pain Pain - Right/Left: Left Pain - part of body: Leg    Activity Tolerance Patient tolerated treatment well   Patient Left in chair;with call bell/phone within reach   Nurse Communication Mobility status        Time: 9165-9089 OT Time Calculation (min): 36 min  Charges: OT General Charges $OT Visit: 1 Visit OT Treatments $Self Care/Home Management : 23-37 mins  Warrick POUR OTR/L  Acute Rehab Services  [phone] office number   Warrick Berber 03/05/2024, 9:20 AM

## 2024-03-05 NOTE — Assessment & Plan Note (Signed)
 CAD: Continue home 81mg  ASA daily  HTN: Continue home amlodipine 5mg  daily, home carvedilol 3.125 BID, home hydrochlorothiazide 25mg  daily, and home lisinopril 40 mg daily  HLD: Continue home atorvastatin 40mg  daily Hypothyroidism: Continue home levothyroxine  daily  Seasonal allergies: Continue home loratadine  10mg  daily  GERD: Continue home pantoprazole  40mg  daily

## 2024-03-06 ENCOUNTER — Other Ambulatory Visit: Payer: Self-pay | Admitting: Adult Health

## 2024-03-06 ENCOUNTER — Non-Acute Institutional Stay (SKILLED_NURSING_FACILITY): Payer: Self-pay | Admitting: Adult Health

## 2024-03-06 ENCOUNTER — Ambulatory Visit: Payer: Medicare HMO | Admitting: Hematology

## 2024-03-06 ENCOUNTER — Encounter: Payer: Self-pay | Admitting: Adult Health

## 2024-03-06 DIAGNOSIS — M1712 Unilateral primary osteoarthritis, left knee: Secondary | ICD-10-CM | POA: Diagnosis not present

## 2024-03-06 DIAGNOSIS — J3089 Other allergic rhinitis: Secondary | ICD-10-CM | POA: Diagnosis not present

## 2024-03-06 DIAGNOSIS — E039 Hypothyroidism, unspecified: Secondary | ICD-10-CM

## 2024-03-06 DIAGNOSIS — J441 Chronic obstructive pulmonary disease with (acute) exacerbation: Secondary | ICD-10-CM | POA: Diagnosis not present

## 2024-03-06 DIAGNOSIS — F341 Dysthymic disorder: Secondary | ICD-10-CM | POA: Diagnosis not present

## 2024-03-06 DIAGNOSIS — I10 Essential (primary) hypertension: Secondary | ICD-10-CM | POA: Diagnosis not present

## 2024-03-06 DIAGNOSIS — K219 Gastro-esophageal reflux disease without esophagitis: Secondary | ICD-10-CM | POA: Diagnosis not present

## 2024-03-06 MED ORDER — CLONAZEPAM 0.5 MG PO TABS: 0.2500 mg | ORAL_TABLET | ORAL | 5 refills | Status: DC

## 2024-03-06 NOTE — Progress Notes (Signed)
 Location:  [facility] Room Number: 131 Place of Service:  SNF (31)   CODE STATUS: full  Allergies  Allergen Reactions   Sulfa Antibiotics Anaphylaxis    Chief Complaint  Patient presents with   Hospitalization Follow-up    HPI:  She is a 83 year old woman who has been hospitalized from 03-01-24 through 03-05-24. Her past medical history includes: COPD, tobacco abuse, hypertension, hyperlipidemia; left knee arthritis, anxiety/depression. She presented to the ED with severe left thigh pain. She was unable to stand on her leg.  Upon discharging from the ED she was found to have need for 02. She was desatting in the mid 80's while ambulating. Her workup was negative for dvt and pe. Her cardiac workup was unremarkable for heart failure exacerbation. She was gradually weaned from 2 liters to room air. She did work with pt/ot throughout the hospitalization. Her pain was managed with tylenol  and voltaren gel as needed. She is being admitted to this facility for short term rehab with her goal to return back home. She will continue to be followed for her chronic illnesses including: COPD exacerbation:    Non seasonal allergic rhinitis unspecified trigger:   Essential hypertension:    Past Medical History:  Diagnosis Date   Abnormal heart rhythms    Allergic rhinitis    Anxiety    Arthritis    Bronchitis    Chest wall pain    ANTERIOR   Depression    Dysuria    Eustachian tube dysfunction    Gallstones    Gallstones    GERD (gastroesophageal reflux disease)    Hyperlipidemia    Hypertension    Hypothyroidism    Hypoxemia    Incontinence    Liver function study, abnormal    Malaise and fatigue    Microscopic hematuria    Osteopenia    Personal history of colonic polyps    Primary breast malignancy (HCC)    Snoring    Tobacco abuse    Ulcer     Past Surgical History:  Procedure Laterality Date   BILATERAL MASTECTOMY     BILIARY DILATION  06/26/2023    Procedure: DILATION, STRICTURE, BILE DUCT;  Surgeon: Charlanne Groom, MD;  Location: [facility] ENDOSCOPY;  Service: Gastroenterology;;   CHOLECYSTECTOMY N/A 06/25/2023   Procedure: LAPAROSCOPIC CHOLECYSTECTOMY;  Surgeon: Ebbie Cough, MD;  Location: [facility] OR;  Service: General;  Laterality: N/A;   COLONOSCOPY  07/2005   MFM:[email or web address] internal hemorrhoids otherwise normal   COLONOSCOPY  05/25/2010   MFM:[email or web address] hemorrhoids, otherwise normal rectum and colon.(POOR PREP). Due for surveillance 2017.    COLONOSCOPY  04/22/2002   RMR: Internal hemorrhoids, otherwise normal rectum  Polyps in the left colon resected with snare   COLONOSCOPY N/A 11/30/2017   Procedure: COLONOSCOPY;  Surgeon: Shaaron Lamar HERO, MD;  Location: [facility];  Service: Endoscopy;  Laterality: N/A;  12:00pm   ERCP N/A 06/26/2023   Procedure: ENDOSCOPIC RETROGRADE CHOLANGIOPANCREATOGRAPHY (ERCP);  Surgeon: Charlanne Groom, MD;  Location: [facility] ENDOSCOPY;  Service: Gastroenterology;  Laterality: N/A;   ESOPHAGOGASTRODUODENOSCOPY  05/25/2010   RMR:EGD distal esophageal erosions consistent with mild erosive reflux esophagitis, otherwise unremarkable esophagus status post passage of a 56-French Maloney dilator/Small hiatal hernia/Gastric ulcer with negative H.pylori. Overdue for surveillance.    ESOPHAGOGASTRODUODENOSCOPY N/A 03/10/2013   Dr. Shaaron: Schatzki's ring s/p dilation, erosive reflux esophagitis, hiatal hernia, gastric erosions, negative h.pylori   ESOPHAGOGASTRODUODENOSCOPY N/A 11/30/2017   Procedure: ESOPHAGOGASTRODUODENOSCOPY (EGD);  Surgeon: Shaaron Lamar HERO,  MD;  Location: [facility];  Service: Endoscopy;  Laterality: N/A;   LEFT LEG SURGERY     MULTIPLE DUE TO FX   LEFT RTC TEAR     MALONEY DILATION N/A 03/10/2013   Procedure: AGAPITO DILATION;  Surgeon: Lamar CHRISTELLA Hollingshead, MD;  Location: [facility];  Service: Endoscopy;  Laterality: N/A;   MALONEY DILATION N/A 11/30/2017   Procedure: AGAPITO DILATION;  Surgeon: Hollingshead Lamar CHRISTELLA, MD;  Location: [facility];  Service: Endoscopy;  Laterality: N/A;   SAVORY DILATION N/A 03/10/2013   Procedure: SAVORY DILATION;  Surgeon: Lamar CHRISTELLA Hollingshead, MD;  Location: [facility];  Service: Endoscopy;  Laterality: N/A;   STONE EXTRACTION WITH BASKET  06/26/2023   Procedure: ERCP, WITH LITHROTRIPSY OR REMOVAL OF COMMON BILE DUCT CALCULUS USING BALLOON;  Surgeon: Charlanne Groom, MD;  Location: [facility] ENDOSCOPY;  Service: Gastroenterology;;   TONSILECTOMY, ADENOIDECTOMY, BILATERAL MYRINGOTOMY AND TUBES     TONSILLECTOMY     VESICOVAGINAL FISTULA CLOSURE W/ TAH     excessive bleeding-benign    Social History   Socioeconomic History   Marital status: Single    Spouse name: Not on file   Number of children: Not on file   Years of education: Not on file   Highest education level: Not on file  Occupational History   Occupation: retired  Tobacco Use   Smoking status: Some Days    Current packs/day: 0.25    Average packs/day: 0.3 packs/day for 25.0 years (6.3 ttl pk-yrs)    Types: Cigarettes   Smokeless tobacco: Never   Tobacco comments:    Smokes a pack of cigarettes about every 2-3 days  Vaping Use   Vaping status: Never Used  Substance and Sexual Activity   Alcohol use: No   Drug use: No   Sexual activity: Not Currently  Other Topics Concern   Not on file  Social History Narrative   Not on file   Social Drivers of Health   Financial Resource Strain: Not on file  Food Insecurity: Unknown (03/02/2024)   Hunger Vital Sign    Worried About Running Out of Food in the Last Year: Never true    Ran Out of Food in the Last Year: Not on file  Transportation Needs: Unknown (03/02/2024)   PRAPARE - Administrator, Civil Service (Medical): No    Lack of Transportation (Non-Medical): Not on file  Physical Activity: Not on file  Stress: Not on file  Social Connections: Unknown (03/02/2024)   Social Connection and Isolation Panel    Frequency of Communication with  Friends and Family: Three times a week    Frequency of Social Gatherings with Friends and Family: Three times a week    Attends Religious Services: More than 4 times per year    Active Member of Clubs or Organizations: Yes    Attends Banker Meetings: More than 4 times per year    Marital Status: Patient declined  Intimate Partner Violence: Unknown (03/02/2024)   Humiliation, Afraid, Rape, and Kick questionnaire    Fear of Current or Ex-Partner: No    Emotionally Abused: Not on file    Physically Abused: Not on file    Sexually Abused: Not on file   Family History  Problem Relation Age of Onset   Cancer Mother    Heart disease Father    Heart disease Brother    Cancer Sister    Aneurysm Sister    Colon cancer  Neg Hx       VITAL SIGNS BP 122/66   Pulse 80   Temp (!) 97.1 F (36.2 C)   Resp 18   Ht 5' 6 (1.676 m)   Wt 160 lb 3.2 oz (72.7 kg)   SpO2 98%   BMI 25.86 kg/m   Outpatient Encounter Medications as of 03/06/2024  Medication Sig   acetaminophen  (TYLENOL ) 500 MG tablet Take 1,000 mg by mouth daily as needed (for pain).   albuterol (VENTOLIN HFA) 108 (90 Base) MCG/ACT inhaler Inhale 2 puffs into the lungs every 6 (six) hours as needed for wheezing or shortness of breath.   amLODipine (NORVASC) 5 MG tablet Take 5 mg by mouth daily.   aspirin EC 81 MG tablet Take 81 mg by mouth daily.   atorvastatin (LIPITOR) 40 MG tablet Take 40 mg by mouth daily.   calcium carbonate (TUMS - DOSED IN MG ELEMENTAL CALCIUM) 500 MG chewable tablet Chew 2 tablets by mouth daily.   carvedilol (COREG) 3.125 MG tablet Take 3.125 mg by mouth in the morning and at bedtime.   clonazePAM  (KLONOPIN ) 0.5 MG tablet Take 0.5 tablets (0.25 mg total) by mouth See admin instructions. Take 0.25 mg by mouth at bedtime and an additional 0.25 mg once a day as needed for anxiety   diclofenac Sodium (VOLTAREN) 1 % GEL Apply 2 g topically 4 (four) times daily as needed (pain).    hydrochlorothiazide (HYDRODIURIL) 25 MG tablet Take 25 mg by mouth daily.   levothyroxine  (SYNTHROID , LEVOTHROID) 137 MCG tablet Take 137 mcg by mouth daily before breakfast.    lisinopril (ZESTRIL) 40 MG tablet Take 40 mg by mouth daily.   loratadine  (CLARITIN ) 10 MG tablet Take 10 mg by mouth at bedtime.   Multiple Vitamins-Minerals (PRESERVISION AREDS 2) CAPS Take 1 capsule by mouth in the morning and at bedtime.   omeprazole (PRILOSEC) 40 MG capsule Take 40 mg by mouth daily.   umeclidinium-vilanterol (ANORO ELLIPTA) 62.5-25 MCG/ACT AEPB Inhale 1 puff into the lungs daily.   No facility-administered encounter medications on file as of 03/06/2024.     SIGNIFICANT DIAGNOSTIC EXAMS  LABS  03-01-24: wbc 10.7; hgb 12.7; hct 38.3; mcv 91.8 plt 227; glucose 102; bun 12; creat 0.49; k+ 3.2; na++ 137; ca 9.0 gfr >60 d-dimer: 6.25 03-02-24: BNP 201.9; tsh 4.439; hgb A1c 5.4 03-04-24; glucose 97; bun 11; creat 0.56; k+ 4.0; na++ 134; ca 9.0 gfr >60   Review of Systems  Constitutional:  Negative for malaise/fatigue.  Respiratory:  Negative for cough, shortness of breath and wheezing.   Cardiovascular:  Negative for chest pain, palpitations and leg swelling.  Gastrointestinal:  Negative for abdominal pain, constipation and heartburn.  Musculoskeletal:  Negative for back pain, joint pain and myalgias.  Skin: Negative.   Neurological:  Negative for dizziness.  Psychiatric/Behavioral:  The patient is not nervous/anxious.     Physical Exam Constitutional:      General: She is not in acute distress.    Appearance: She is well-developed. She is not diaphoretic.  Neck:     Thyroid : No thyromegaly.  Cardiovascular:     Rate and Rhythm: Normal rate and regular rhythm.     Pulses: Normal pulses.     Heart sounds: Normal heart sounds.  Pulmonary:     Effort: Pulmonary effort is normal. No respiratory distress.     Breath sounds: Normal breath sounds.  Abdominal:     General: Bowel sounds are  normal. There is no distension.  Palpations: Abdomen is soft.     Tenderness: There is no abdominal tenderness.  Musculoskeletal:        General: Normal range of motion.     Cervical back: Neck supple.     Right lower leg: No edema.     Left lower leg: No edema.  Lymphadenopathy:     Cervical: No cervical adenopathy.  Skin:    General: Skin is warm and dry.  Neurological:     Mental Status: She is alert and oriented to person, place, and time.  Psychiatric:        Mood and Affect: Mood normal.      ASSESSMENT/ PLAN:  TODAY  COPD exacerbation: she has been weaned from 2 liters to room air; will continue umeclidinim/vilantrol 62.5/50 mcg daily; has albuterol inhaler every 4 hours as needed  2. Non seasonal allergic rhinitis unspecified trigger: will continue claritin  10 mg daily;   3.  Essential hypertension: b/p 122/66 will continue norvasc 5 mg daily; asa 81 mg daily; hydrochlorothiazide 25 mg daily; lisinopril 40 mg daily; coreg 3.125 mg twice daily   4. Gastroesophageal reflux disease without esophagitis: will continue prilosec 40 mg daily   5. Acquired hypothyroidism: tsh 4.439 will continue synthroid  137 mcg daily   6. Arthritis of left knee: pain has improved will continue voltaren gel 2 gm four times daily as needed  7. Depression/anxiety: will continue klonpin 0.25 mg nightly and daily as needed      Barnie Seip NP [facility]   call [phone]

## 2024-03-07 ENCOUNTER — Inpatient Hospital Stay: Admitting: Oncology

## 2024-03-07 VITALS — BP 124/52 | HR 76 | Temp 97.8°F | Resp 19 | Wt 170.9 lb

## 2024-03-07 DIAGNOSIS — R911 Solitary pulmonary nodule: Secondary | ICD-10-CM

## 2024-03-07 DIAGNOSIS — Z860100 Personal history of colon polyps, unspecified: Secondary | ICD-10-CM | POA: Diagnosis not present

## 2024-03-07 DIAGNOSIS — Z853 Personal history of malignant neoplasm of breast: Secondary | ICD-10-CM | POA: Diagnosis not present

## 2024-03-07 DIAGNOSIS — Z803 Family history of malignant neoplasm of breast: Secondary | ICD-10-CM | POA: Diagnosis not present

## 2024-03-07 DIAGNOSIS — F17210 Nicotine dependence, cigarettes, uncomplicated: Secondary | ICD-10-CM | POA: Diagnosis not present

## 2024-03-07 DIAGNOSIS — Z808 Family history of malignant neoplasm of other organs or systems: Secondary | ICD-10-CM | POA: Diagnosis not present

## 2024-03-07 DIAGNOSIS — M4854XA Collapsed vertebra, not elsewhere classified, thoracic region, initial encounter for fracture: Secondary | ICD-10-CM | POA: Diagnosis not present

## 2024-03-07 NOTE — Progress Notes (Signed)
 [facility] [address] [place] [address]    Clinic Day:  03/07/2024  Referring physician: Shona Norleen PEDLAR, MD  Patient Care Team: Susan Norleen PEDLAR, MD as PCP - General (Internal Medicine) Susan Joesph SQUIBB, RN as Oncology Nurse Navigator (Medical Oncology)   ASSESSMENT & PLAN:   Assessment: Enlarging right upper lobe pulmonary nodule: - Patient seen at the request of Dr. Joeann for enlarging right lung nodule. - CT chest without contrast (08/05/2021): Enlarging groundglass nodule in the right upper lobe with possible linear solid component.  Measures 1.7 x 2.9 x 2.4 cm, previously 1.8 x 2.6 x 1.4 cm.  This was compared to CT scan from July 2018. - She denies any change in chronic cough.  No recent infections.  No hemoptysis. -PET scan (09/01/2021): Groundglass nodule 2.6 x 1.8 cm SUV 1.13 with no adenopathy. - CT chest (03/03/2022): Groundglass 3 x 2.4 cm posterior right upper lobe lung mass without a measurable solid component similar to scan from April 2023.  Mildly increased from 2.8 x 1.6 cm from 10/27/2016.  No thoracic adenopathy.   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 - Stage I left breast invasive lobular carcinoma, mastectomy on 02/16/1998, status post tamoxifen for 5 years completed in November 2004.    Social/family history: - She lives by herself at home.  She is retired from engineering geologist work.  No chemical exposure or asbestos exposure.  She is current active smoker, smokes 2 packs/week for the last 53 years. - Brother had lung cancer.  Mother had breast cancer.  Sister had breast cancer.  Paternal aunt had melanoma.  Paternal first cousin had thyroid  and breast cancer.  Nephew had glioblastoma multiform a.  Plan: Enlarging right upper lobe pulmonary nodule: - She denies any cough or hemoptysis. - We reviewed CT  chest from 02/28/2024 showed right upper lobe nodule measuring 2.9 x 2.06 cm similar to slightly increased from previous.  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  Continued surveillance with CT in 12 months is recommended.  New T12 compression fracture with approximately 20% vertebral body height loss. - She has previously refused radiation.  She would like to continue observation at this time. -Labs from 03/02/2024 showed mild low potassium level 3.4, normal creatinine.  - She will return back to the clinic in 1 year with repeat CT scan.  2.  T12 compression fracture: -CT scan incidentally noted to have T12 compression fracture with 20% vertebral body height loss. -She has new acute onset of left leg pain similar to sciatica beginning in her low back and she is unable to bear weight and hold herself up.  Prior to that she was using a walker and this was sufficient. -We discussed orthopedic referral given she is now currently living at the [facility] due to feeling unsafe at home alone. -She is currently undergoing physical therapy.  -Patient may need MRI for further characterization of compression fracture.  Orders Placed This Encounter  Procedures   CT CHEST W CONTRAST    Standing Status:   Future    Expected Date:   03/07/2025    Expiration Date:   06/05/2025    If indicated for the ordered procedure, I authorize the administration of contrast media per Radiology protocol:   Yes    Does the patient have a contrast media/X-ray dye allergy?:   No  Preferred imaging location?:   [facility]   Ambulatory referral to Orthopedic Surgery    Referral Priority:   Routine    Referral Type:   Surgical    Referral Reason:   Specialty Services Required    Referred to Provider:   Margrette Taft BRAVO, MD    Requested Specialty:   Orthopedic Surgery    Number of Visits Requested:   1    [name] Hope, NP   11/14/20251:49 PM  CHIEF COMPLAINT:   Diagnosis: Nodule of upper  lobe of right lung    Cancer Staging  No matching staging information was found for the patient.    Prior Therapy: none  Current Therapy:  observation   HISTORY OF PRESENT ILLNESS:   Oncology History   No history exists.     INTERVAL HISTORY:   [name] is a 83 y.o. female presenting to clinic today for follow up of right pulmonary lung nodule.   Since her last visit, she underwent CT chest on 02/28/2024 which showed a new T12 compression fracture, right upper lobe pulmonary nodule measuring 2.9 x 2.0 cm similar to slightly increased from 03/01/2023.  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  Continue surveillance with CT in 12 months recommended.  Patient was recently evaluated at [facility] ED for hypoxia requiring oxygen.  D-dimer was elevated.  CT angio without evidence of PE.  Patient has history of COPD so she was given nebulizers which were helpful.  Discharged home without oxygen.  Patient's daughter-in-law reports that she was discharged home to the [facility] because of weakness low back and leg pain.  Prior to that, she was using a cane and/or walker to get around but now she is scared to bear weight.  She has been receiving PT twice daily over at the [facility] which is helping some.  Reports she got a Prolia  injection on 10/22/2023 for osteoporosis.  Appetite is 100% energy levels are 50%.  She has 7 out of 10 hip and leg pain.  She has cough and shortness of breath at times.  She smokes on occasion.  Has constipation and diarrhea intermittently.  Has tingling in bilateral feet.  She was instructed to start an over-the-counter nerve medication called nervive which has not been helpful.  She would like to remain under observation and is not electing to do surgery or radiation.  PAST MEDICAL HISTORY:   Past Medical History: Past Medical History:  Diagnosis Date   Abnormal heart rhythms    Allergic rhinitis    Anxiety    Arthritis    Bronchitis    Chest  wall pain    ANTERIOR   Depression    Dysuria    Eustachian tube dysfunction    Gallstones    Gallstones    GERD (gastroesophageal reflux disease)    Hyperlipidemia    Hypertension    Hypothyroidism    Hypoxemia    Incontinence    Liver function study, abnormal    Malaise and fatigue    Microscopic hematuria    Osteopenia    Personal history of colonic polyps    Primary breast malignancy (HCC)    Snoring    Tobacco abuse    Ulcer     Surgical History: Past Surgical History:  Procedure Laterality Date   BILATERAL MASTECTOMY     BILIARY DILATION  06/26/2023   Procedure: DILATION, STRICTURE, BILE DUCT;  Surgeon: Charlanne Groom, MD;  Location: [facility] ENDOSCOPY;  Service: Gastroenterology;;  CHOLECYSTECTOMY N/A 06/25/2023   Procedure: LAPAROSCOPIC CHOLECYSTECTOMY;  Surgeon: Ebbie Cough, MD;  Location: [facility] OR;  Service: General;  Laterality: N/A;   COLONOSCOPY  07/2005   MFM:[id number] internal hemorrhoids otherwise normal   COLONOSCOPY  05/25/2010   MFM:[id number] hemorrhoids, otherwise normal rectum and colon.(POOR PREP). Due for surveillance 2017.    COLONOSCOPY  04/22/2002   RMR: Internal hemorrhoids, otherwise normal rectum  Polyps in the left colon resected with snare   COLONOSCOPY N/A 11/30/2017   Procedure: COLONOSCOPY;  Surgeon: Shaaron Lamar HERO, MD;  Location: [facility] ENDO SUITE;  Service: Endoscopy;  Laterality: N/A;  12:00pm   ERCP N/A 06/26/2023   Procedure: ENDOSCOPIC RETROGRADE CHOLANGIOPANCREATOGRAPHY (ERCP);  Surgeon: Charlanne Groom, MD;  Location[facility] ENDOSCOPY;  Service: Gastroenterology;  Laterality: N/A;   ESOPHAGOGASTRODUODENOSCOPY  05/25/2010   RMR:EGD distal esophageal erosions consistent with mild erosive reflux esophagitis, otherwise unremarkable esophagus status post passage of a 56-French Maloney dilator/Small hiatal hernia/Gastric ulcer with negative H.pylori. Overdue for surveillance.    ESOPHAGOGASTRODUODENOSCOPY N/A 03/10/2013   Dr. Shaaron: Schatzki's ring  s/p dilation, erosive reflux esophagitis, hiatal hernia, gastric erosions, negative h.pylori   ESOPHAGOGASTRODUODENOSCOPY N/A 11/30/2017   Procedure: ESOPHAGOGASTRODUODENOSCOPY (EGD);  Surgeon: Shaaron Lamar HERO, MD;  Location: [facility] END[facility] SUITE;  Service: Endoscopy;  Laterality: N/A;   LEFT LEG SURGERY     MULTIPLE DUE TO FX   LEFT RTC TEAR     MALONEY DILATION N/A 03/10/2013   Procedure: AGAPITO DILATION;  Surgeon: Lamar HERO Shaaron, MD;  Location: [facility] ENDO SUITE;  Service: Endoscopy;  Laterality: N/A;   MALONEY DILATION N/A 11/30/2017   Procedure: AGAPITO DILATION;  Surgeon: Shaaron Lamar HERO, MD;  Location: [facility] END[facility];  Service: Endoscopy;  Laterality: N/A;   SAVORY DILATION N/A 03/10/2013   Procedure: SAVORY DILATION;  Surgeon: Lamar HERO Shaaron, MD;  Location: [facility] ENDO SUITE;  Service: Endoscopy;  Laterality: N/A;   STONE EXTRACTION WITH BASKET  06/26/2023   Procedure: ERCP, WITH LITHROTRIPSY OR REMOVAL OF COMMON BILE DUCT CALCULUS USING BALLOON;  Surgeon: Charlanne Groom, MD;  Location: [facility] ENDOSCOPY;  Service: Gastroenterology;;   TONSILECTOMY, ADENOIDECTOMY, BILATERAL MYRINGOTOMY AND TUBES     TONSILLECTOMY     VESICOVAGINAL FISTULA CLOSURE W/ TAH     excessive bleeding-benign    Social History: Social History   Socioeconomic History   Marital status: Single    Spouse name: Not on file   Number of children: Not on file   Years of education: Not on file   Highest education level: Not on file  Occupational History   Occupation: retired  Tobacco Use   Smoking status: Some Days    Current packs/day: 0.25    Average packs/day: 0.3 packs/day for 25.0 years (6.3 ttl pk-yrs)    Types: Cigarettes   Smokeless tobacco: Never   Tobacco comments:    Smokes a pack of cigarettes about every 2-3 days  Vaping Use   Vaping status: Never Used  Substance and Sexual Activity   Alcohol use: No   Drug use: No   Sexual activity: Not Currently  Other Topics Concern   Not on file  Social History  Narrative   Not on file   Social Drivers of Health   Financial Resource Strain: Not on file  Food Insecurity: Unknown (03/02/2024)   Hunger Vital Sign    Worried About Running Out of Food in the Last Year: Never true    Ran Out of Food in the Last Year: Not on file  Transportation  Needs: Unknown (03/02/2024)   PRAPARE - Administrator, Civil Service (Medical): No    Lack of Transportation (Non-Medical): Not on file  Physical Activity: Not on file  Stress: Not on file  Social Connections: Unknown (03/02/2024)   Social Connection and Isolation Panel    Frequency of Communication with Friends and Family: Three times a week    Frequency of Social Gatherings with Friends and Family: Three times a week    Attends Religious Services: More than 4 times per year    Active Member of Clubs or Organizations: Yes    Attends Banker Meetings: More than 4 times per year    Marital Status: Patient declined  Intimate Partner Violence: Unknown (03/02/2024)   Humiliation, Afraid, Rape, and Kick questionnaire    Fear of Current or Ex-Partner: No    Emotionally Abused: Not on file    Physically Abused: Not on file    Sexually Abused: Not on file    Family History: Family History  Problem Relation Age of Onset   Cancer Mother    Heart disease Father    Heart disease Brother    Cancer Sister    Aneurysm Sister    Colon cancer Neg Hx     Current Medications:  Current Outpatient Medications:    acetaminophen  (TYLENOL ) 500 MG tablet, Take 1,000 mg by mouth daily as needed (for pain)., Disp: , Rfl:    albuterol (VENTOLIN HFA) 108 (90 Base) MCG/ACT inhaler, Inhale 2 puffs into the lungs every 6 (six) hours as needed for wheezing or shortness of breath., Disp: , Rfl:    amLODipine (NORVASC) 5 MG tablet, Take 5 mg by mouth daily., Disp: , Rfl:    aspirin EC 81 MG tablet, Take 81 mg by mouth daily., Disp: , Rfl:    atorvastatin (LIPITOR) 40 MG tablet, Take 40 mg by mouth  daily., Disp: , Rfl:    calcium carbonate (TUMS - DOSED IN MG ELEMENTAL CALCIUM) 500 MG chewable tablet, Chew 2 tablets by mouth daily., Disp: , Rfl:    carvedilol (COREG) 3.125 MG tablet, Take 3.125 mg by mouth in the morning and at bedtime., Disp: , Rfl:    clonazePAM  (KLONOPIN ) 0.5 MG tablet, Take 0.5 tablets (0.25 mg total) by mouth See admin instructions. Take 0.25 mg by mouth at bedtime and an additional 0.25 mg once a day as needed for anxiety, Disp: 60 tablet, Rfl: 5   diclofenac Sodium (VOLTAREN) 1 % GEL, Apply 2 g topically 4 (four) times daily as needed (pain)., Disp: , Rfl:    hydrochlorothiazide (HYDRODIURIL) 25 MG tablet, Take 25 mg by mouth daily., Disp: , Rfl:    levothyroxine  (SYNTHROID , LEVOTHROID) 137 MCG tablet, Take 137 mcg by mouth daily before breakfast. , Disp: , Rfl: 5   lisinopril (ZESTRIL) 40 MG tablet, Take 40 mg by mouth daily., Disp: , Rfl:    loratadine  (CLARITIN ) 10 MG tablet, Take 10 mg by mouth at bedtime., Disp: , Rfl:    Multiple Vitamins-Minerals (PRESERVISION AREDS 2) CAPS, Take 1 capsule by mouth in the morning and at bedtime., Disp: , Rfl:    omeprazole (PRILOSEC) 40 MG capsule, Take 40 mg by mouth daily., Disp: , Rfl:    umeclidinium-vilanterol (ANORO ELLIPTA) 62.5-25 MCG/ACT AEPB, Inhale 1 puff into the lungs daily., Disp: , Rfl:    Allergies: Allergies  Allergen Reactions   Sulfa Antibiotics Anaphylaxis    REVIEW OF SYSTEMS:   Review of Systems  Constitutional:  Negative for chills, fatigue and fever.  HENT:   Positive for trouble swallowing. Negative for lump/mass, mouth sores, nosebleeds and sore throat.   Eyes:  Negative for eye problems.  Respiratory:  Positive for cough and shortness of breath.   Cardiovascular:  Negative for chest pain, leg swelling and palpitations.  Gastrointestinal:  Positive for constipation. Negative for abdominal pain, diarrhea, nausea and vomiting.  Genitourinary:  Negative for bladder incontinence, difficulty  urinating, dysuria, frequency, hematuria and nocturia.   Musculoskeletal:  Negative for arthralgias, back pain, flank pain, myalgias and neck pain.  Skin:  Negative for itching and rash.  Neurological:  Negative for dizziness, headaches and numbness.       +tingling hands and feet  Hematological:  Does not bruise/bleed easily.  Psychiatric/Behavioral:  Negative for depression, sleep disturbance and suicidal ideas. The patient is not nervous/anxious.   All other systems reviewed and are negative.    VITALS:   Blood pressure (!) 124/52, pulse 76, temperature 97.8 F (36.6 C), temperature source Oral, resp. rate 19, weight 170 lb 14.4 oz (77.5 kg), SpO2 98%.  Wt Readings from Last 3 Encounters:  03/07/24 170 lb 14.4 oz (77.5 kg)  03/06/24 160 lb 3.2 oz (72.7 kg)  03/05/24 167 lb 1.6 oz (75.8 kg)    Body mass index is 27.58 kg/m.  Performance status (ECOG): 1 - Symptomatic but completely ambulatory  PHYSICAL EXAM:   Physical Exam Vitals and nursing note reviewed. Exam conducted with a chaperone present.  Constitutional:      Appearance: Normal appearance.  Cardiovascular:     Rate and Rhythm: Normal rate and regular rhythm.     Pulses: Normal pulses.     Heart sounds: Normal heart sounds.  Pulmonary:     Effort: Pulmonary effort is normal.     Breath sounds: Normal breath sounds.  Abdominal:     Palpations: Abdomen is soft. There is no hepatomegaly, splenomegaly or mass.     Tenderness: There is no abdominal tenderness.  Musculoskeletal:     Right lower leg: No edema.     Left lower leg: No edema.  Lymphadenopathy:     Cervical: No cervical adenopathy.     Right cervical: No superficial, deep or posterior cervical adenopathy.    Left cervical: No superficial, deep or posterior cervical adenopathy.     Upper Body:     Right upper body: No supraclavicular or axillary adenopathy.     Left upper body: No supraclavicular or axillary adenopathy.  Neurological:     General: No  focal deficit present.     Mental Status: She is alert and oriented to person, place, and time.  Psychiatric:        Mood and Affect: Mood normal.        Behavior: Behavior normal.     LABS:      Latest Ref Rng & Units 03/01/2024    8:30 PM 02/28/2024   12:44 PM 06/27/2023    9:31 AM  CBC  WBC 4.0 - 10.5 K/uL 10.7  9.1  8.1   Hemoglobin 12.0 - 15.0 g/dL 87.2  87.6  88.4   Hematocrit 36.0 - 46.0 % 38.3  38.4  34.3   Platelets 150 - 400 K/uL 227  275  239       Latest Ref Rng & Units 03/04/2024    5:04 AM 03/03/2024    4:48 AM 03/01/2024    9:51 PM  CMP  Glucose 70 - 99 mg/dL 97  111  102   BUN 8 - 23 mg/dL 11  10  12    Creatinine 0.44 - 1.00 mg/dL 9.43  9.32  9.50   Sodium 135 - 145 mmol/L 134  135  137   Potassium 3.5 - 5.1 mmol/L 4.0  3.4  3.2   Chloride 98 - 111 mmol/L 96  97  98   CO2 22 - 32 mmol/L 25  25  24    Calcium 8.9 - 10.3 mg/dL 9.1  8.8  9.0      No results found for: CEA1, CEA / No results found for: CEA1, CEA No results found for: PSA1 No results found for: CAN199 No results found for: CAN125  No results found for: TOTALPROTELP, ALBUMINELP, A1GS, A2GS, BETS, BETA2SER, GAMS, MSPIKE, SPEI No results found for: TIBC, FERRITIN, IRONPCTSAT No results found for: LDH   STUDIES:   ECHOCARDIOGRAM COMPLETE Result Date: 03/04/2024    ECHOCARDIOGRAM REPORT   Patient Name:   AUGUST GOSSER Date of Exam: 03/03/2024 Medical Rec #:  [id number]      Height:       63.0 in Accession #:    [id number]     Weight:       176.4 lb Date of Birth:  1940-06-23      BSA:          1.833 m Patient Age:    83 years       BP:           127/47 mmHg Patient Gender: F              HR:           82 bpm. Exam Location:  Inpatient Procedure: 2D Echo (Both Spectral and Color Flow Doppler were utilized during            procedure). Indications:    dyspnea  History:        Patient has no prior history of Echocardiogram examinations.                 COPD; Risk  Factors:Hypertension, Dyslipidemia and Current                 Smoker.  Sonographer:    Tinnie Barefoot RDCS Referring Phys: 59 MELANIE BELFI IMPRESSIONS  1. Left ventricular ejection fraction, by estimation, is 60 to 65%. The left ventricle has normal function. The left ventricle has no regional wall motion abnormalities. Left ventricular diastolic parameters are consistent with Grade II diastolic dysfunction (pseudonormalization). Elevated left atrial pressure.  2. Right ventricular systolic function is normal. The right ventricular size is normal. There is mildly elevated pulmonary artery systolic pressure. The estimated right ventricular systolic pressure is 40.5 mmHg.  3. Left atrial size was mildly dilated.  4. The mitral valve is normal in structure. Trivial mitral valve regurgitation. No evidence of mitral stenosis.  5. The aortic valve is tricuspid. Aortic valve regurgitation is not visualized. Aortic valve sclerosis/calcification is present, without any evidence of aortic stenosis.  6. The inferior vena cava is dilated in size with >50% respiratory variability, suggesting right atrial pressure of 8 mmHg. FINDINGS  Left Ventricle: Left ventricular ejection fraction, by estimation, is 60 to 65%. The left ventricle has normal function. The left ventricle has no regional wall motion abnormalities. The left ventricular internal cavity size was normal in size. There is  no left ventricular hypertrophy. Left ventricular diastolic parameters are consistent with Grade II diastolic dysfunction (pseudonormalization). Elevated left atrial pressure.  Right Ventricle: The right ventricular size is normal. No increase in right ventricular wall thickness. Right ventricular systolic function is normal. There is mildly elevated pulmonary artery systolic pressure. The tricuspid regurgitant velocity is 2.85  m/s, and with an assumed right atrial pressure of 8 mmHg, the estimated right ventricular systolic pressure is 40.5  mmHg. Left Atrium: Left atrial size was mildly dilated. Right Atrium: Right atrial size was normal in size. Pericardium: There is no evidence of pericardial effusion. Mitral Valve: The mitral valve is normal in structure. Trivial mitral valve regurgitation. No evidence of mitral valve stenosis. Tricuspid Valve: The tricuspid valve is normal in structure. Tricuspid valve regurgitation is trivial. No evidence of tricuspid stenosis. Aortic Valve: The aortic valve is tricuspid. Aortic valve regurgitation is not visualized. Aortic valve sclerosis/calcification is present, without any evidence of aortic stenosis. Pulmonic Valve: The pulmonic valve was normal in structure. Pulmonic valve regurgitation is trivial. No evidence of pulmonic stenosis. Aorta: The aortic root is normal in size and structure. Venous: The inferior vena cava is dilated in size with greater than 50% respiratory variability, suggesting right atrial pressure of 8 mmHg. IAS/Shunts: No atrial level shunt detected by color flow Doppler.  LEFT VENTRICLE PLAX 2D LVIDd:         4.10 cm   Diastology LVIDs:         3.00 cm   LV e' medial:    6.09 cm/s LV PW:         1.10 cm   LV E/e' medial:  21.2 LV IVS:        1.00 cm   LV e' lateral:   7.40 cm/s LVOT diam:     1.70 cm   LV E/e' lateral: 17.4 LV SV:         44 LV SV Index:   24 LVOT Area:     2.27 cm  RIGHT VENTRICLE             IVC RV Basal diam:  2.80 cm     IVC diam: 2.90 cm RV S prime:     13.40 cm/s TAPSE (M-mode): 1.9 cm LEFT ATRIUM             Index        RIGHT ATRIUM           Index LA diam:        4.20 cm 2.29 cm/m   RA Area:     15.40 cm LA Vol (A2C):   67.5 ml 36.82 ml/m  RA Volume:   35.30 ml  19.26 ml/m LA Vol (A4C):   70.1 ml 38.24 ml/m LA Biplane Vol: 72.5 ml 39.55 ml/m  AORTIC VALVE LVOT Vmax:   101.00 cm/s LVOT Vmean:  67.700 cm/s LVOT VTI:    0.194 m  AORTA Ao Root diam: 2.80 cm Ao Asc diam:  2.80 cm MITRAL VALVE                TRICUSPID VALVE MV Area (PHT): 6.96 cm     TR Peak  grad:   32.5 mmHg MV Decel Time: 109 msec     TR Vmax:        285.00 cm/s MV E velocity: 129.00 cm/s MV A velocity: 69.00 cm/s   SHUNTS MV E/A ratio:  1.87         Systemic VTI:  0.19 m  Systemic Diam: 1.70 cm Wilbert Bihari MD Electronically signed by Wilbert Bihari MD Signature Date/Time: 03/04/2024/8:29:10 AM    Final    CT CHEST W CONTRAST Result Date: 03/02/2024 EXAM: CT CHEST WITH CONTRAST 02/28/2024 03:36:48 PM TECHNIQUE: CT of the chest was performed with the administration of intravenous contrast. Multiplanar reformatted images are provided for review. Automated exposure control, iterative reconstruction, and/or weight based adjustment of the mA/kV was utilized to reduce the radiation dose to as low as reasonably achievable. CONTRAST: 80 mL of Omnipaque  300 was administered. COMPARISON: CT Chest with Contrast 03/01/2023; 01/31/2022. Comparison with the 03/01/2023. CLINICAL HISTORY: Nodule of upper lobe of right lung, high risk of cancer. FINDINGS: MEDIASTINUM: Heart and pericardium are unremarkable. The central airways are clear. Coronary artery and aortic atherosclerotic calcification. LYMPH NODES: No mediastinal, hilar or axillary lymphadenopathy. LUNGS AND PLEURA: No focal consolidation or pulmonary edema. No pleural effusion or pneumothorax. Bibasilar atelectasis or scarring. Biapical parenchymal scarring. 1.0 cm nodule in the left upper lobe on series 2 image 55 is stable since 2018 and benign. No follow up recommended. 2.9 x 2.0 cm gross nodule in the right upper lobe is similar to slightly increased in size from the 03/01/2023. SOFT TISSUES/BONES: Compression fracture of T12, new since the most recent comparison of 06/24/2023. There is approximately 20% vertebral body height loss and 3-4 mm of retropulsion of the superior endplate. UPPER ABDOMEN: Limited images of the upper abdomen demonstrates no acute abnormality. IMPRESSION: 1. New T12 compression fracture with  approximately 20% vertebral body height loss and 34 mm retropulsion of the superior endplate. 2. 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 Continued surveillance with CT in 12 months recommended. Electronically signed by: Norman Gatlin MD 03/02/2024 02:24 PM EST RP Workstation: [id number]   CT Angio Chest PE W and/or Wo Contrast Result Date: 03/02/2024 EXAM: CTA of the Chest with contrast for PE 03/02/2024 01:48:19 PM TECHNIQUE: CTA of the chest was performed without and with the administration of 75 mL of intravenous iohexol  (OMNIPAQUE ) 350 MG/ML injection. Multiplanar reformatted images are provided for review. MIP images are provided for review. Automated exposure control, iterative reconstruction, and/or weight based adjustment of the mA/kV was utilized to reduce the radiation dose to as low as reasonably achievable. COMPARISON: CT 02/28/2024, CT 03/01/2023, and CT 06/24/2023. CLINICAL HISTORY: Pulmonary embolism (PE) suspected, low to intermediate prob, positive D-dimer. FINDINGS: PULMONARY ARTERIES: Pulmonary arteries are adequately opacified for evaluation. No pulmonary embolism. Main pulmonary artery is normal in caliber. MEDIASTINUM: Unchanged small pericardial effusion. There is no acute abnormality of the thoracic aorta. LYMPH NODES: No mediastinal, hilar or axillary lymphadenopathy. LUNGS AND PLEURA: 10 mm left upper lobe nodule on series 6 image 43 is unchanged from 2018 and benign.no follow up recommended . Diffuse bronchial wall thickening and mucus plugging greatest in the lower lobes. Apical pleural parenchymal scarring. Ground glass nodule in the right upper lobe measures 2.9 x 2.5 cm and is similar to slightly increased dating back to 03/01/2023. No focal consolidation or pulmonary edema. No pleural effusion or pneumothorax. UPPER ABDOMEN: Unchanged nodular thickening of both  adrenal glands. SOFT TISSUES AND BONES: Compression fracture of the superior endplate of T12 is new since the most recent comparison of CT 06/24/2023. There is 20% vertebral body height loss with 3-4 mm of retropulsion of the superior endplate. IMPRESSION: 1. No evidence of pulmonary embolism. 2. Lower lobe bronchitis. 3. Age indeterminate compression  fracture of the superior endplate of T12 with 20% vertebral body height loss and 34 mm retropulsion. 4. Ground glass nodule in the right upper lobe measures 2.9 x 2.5 cm, similar to slightly increased since 03/01/23. 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 Continued follow up recommended. Electronically signed by: Norman Gatlin MD 03/02/2024 02:20 PM EST RP Workstation: [id number]   VAS US  LOWER EXTREMITY VENOUS (DVT) (7a-7p) Result Date: 03/02/2024  Lower Venous DVT Study Patient Name:  QAMAR ROSMAN  Date of Exam:   03/02/2024 Medical Rec #: [id number]       Accession #:    [id number] Date of Birth: [birth year]       Patient Gender: F Patient Age:   83 years Exam Location:  [facility] Procedure:      VAS US  LOWER EXTREMITY VENOUS (DVT) Referring Phys: MELANIE BELFI --------------------------------------------------------------------------------  Indications: Swelling.  Risk Factors: None identified. Comparison Study: No prior studies. Performing Technologist: Cordella Collet RVT  Examination Guidelines: A complete evaluation includes B-mode imaging, spectral Doppler, color Doppler, and power Doppler as needed of all accessible portions of each vessel. Bilateral testing is considered an integral part of a complete examination. Limited examinations for reoccurring indications may be performed as noted. The reflux portion of the exam is performed with the patient in reverse Trendelenburg.  +-----+---------------+---------+-----------+----------+--------------+ RIGHTCompressibilityPhasicitySpontaneityPropertiesThrombus Aging  +-----+---------------+---------+-----------+----------+--------------+ CFV  Full           Yes      Yes                                 +-----+---------------+---------+-----------+----------+--------------+   +---------+---------------+---------+-----------+----------+--------------+ LEFT     CompressibilityPhasicitySpontaneityPropertiesThrombus Aging +---------+---------------+---------+-----------+----------+--------------+ CFV      Full           Yes      Yes                                 +---------+---------------+---------+-----------+----------+--------------+ SFJ      Full                                                        +---------+---------------+---------+-----------+----------+--------------+ FV Prox  Full                                                        +---------+---------------+---------+-----------+----------+--------------+ FV Mid   Full                                                        +---------+---------------+---------+-----------+----------+--------------+ FV DistalFull           Yes      Yes                                 +---------+---------------+---------+-----------+----------+--------------+ PFV      Full                                                        +---------+---------------+---------+-----------+----------+--------------+  POP      Full           Yes      Yes                                 +---------+---------------+---------+-----------+----------+--------------+ PTV      Full                                                        +---------+---------------+---------+-----------+----------+--------------+ PERO     Full                                                        +---------+---------------+---------+-----------+----------+--------------+     Summary: RIGHT: - No evidence of common femoral vein obstruction.   LEFT: - There is no evidence of deep vein thrombosis in the  lower extremity.  - No cystic structure found in the popliteal fossa.  *See table(s) above for measurements and observations. Electronically signed by Debby Robertson on 03/02/2024 at 2:14:19 PM.    Final    DG Knee Complete 4 Views Left Result Date: 03/01/2024 EXAM: 4 VIEW(S) XRAY OF THE LEFT KNEE 03/01/2024 07:02:00 PM COMPARISON: None available. CLINICAL HISTORY: leg pain FINDINGS: BONES AND JOINTS: The bones are diffusely osteopenic. There is a lucent lesion with sclerotic border in the proximal tibial metadiaphyseal region laterally measuring 3.5 x 6.7 x 5.0 cm. There is moderate-to-severe tricompartmental osteoarthrosis of the knee with joint space narrowing and osteophyte formation. No acute fracture. No joint dislocation. No significant joint effusion. SOFT TISSUES: There are soft tissue calcifications and surgical clips adjacent to the proximal tibia. IMPRESSION: 1. Moderate-to-severe tricompartmental osteoarthrosis of the left knee with joint space narrowing and osteophyte formation. 2. Indeterminate lucent lesion with sclerotic border in the proximal tibial metadiaphyseal region laterally measuring 3.5 x 6.7 x 5.0 cm. 3. Soft tissue calcifications and surgical clips adjacent to the proximal tibia. Electronically signed by: Greig Pique MD 03/01/2024 07:06 PM EST RP Workstation: [id number]

## 2024-03-11 ENCOUNTER — Encounter: Payer: Self-pay | Admitting: Internal Medicine

## 2024-03-11 ENCOUNTER — Non-Acute Institutional Stay (SKILLED_NURSING_FACILITY): Payer: Self-pay | Admitting: Internal Medicine

## 2024-03-11 DIAGNOSIS — E039 Hypothyroidism, unspecified: Secondary | ICD-10-CM

## 2024-03-11 DIAGNOSIS — F17200 Nicotine dependence, unspecified, uncomplicated: Secondary | ICD-10-CM

## 2024-03-11 DIAGNOSIS — R911 Solitary pulmonary nodule: Secondary | ICD-10-CM

## 2024-03-11 DIAGNOSIS — M5459 Other low back pain: Secondary | ICD-10-CM | POA: Diagnosis not present

## 2024-03-11 DIAGNOSIS — J441 Chronic obstructive pulmonary disease with (acute) exacerbation: Secondary | ICD-10-CM

## 2024-03-11 DIAGNOSIS — M546 Pain in thoracic spine: Secondary | ICD-10-CM | POA: Diagnosis not present

## 2024-03-11 DIAGNOSIS — M25562 Pain in left knee: Secondary | ICD-10-CM

## 2024-03-11 NOTE — Assessment & Plan Note (Signed)
 Current TSH is high normal but still within the therapeutic range.  This should be monitored in 4-6 months to verify stability.

## 2024-03-11 NOTE — Assessment & Plan Note (Signed)
 She appears committed to  tobacco abstinence.  Current O2 sats are good.  She will require surveillance of the 2.9 x 2 cm right upper lobe nodule which may be stable to slightly enlarged.

## 2024-03-11 NOTE — Progress Notes (Unsigned)
 NURSING HOME LOCATION:  [facility] ROOM NUMBER:  [id number]  CODE STATUS:  Full Code  PCP:  Norleen PEDLAR. Shona MD  This is a comprehensive admission note to this SNFperformed on this date less than 30 days from date of admission. Included are preadmission medical/surgical history; reconciled medication list; family history; social history and comprehensive review of systems.  Corrections and additions to the records were documented. Comprehensive physical exam was also performed. Additionally a clinical summary was entered for each active diagnosis pertinent to this admission in the Problem List to enhance continuity of care.  HPI: She was hospitalized 11/8 - 03/05/2024 for hypoxia in the setting of COPD and history of tobacco abuse.  She presented to the ED with a complaint of inability to bear weight on the left lower extremity related to severe arthritis in the left knee.  Evaluation of the LLE pain was essentially unremarkable with no evidence of DVT; but during ambulation she was noted to desaturate into the 80s.  CT scan 11/6 revealed bilateral atelectasis versus scarring.  1 cm left upper lobe nodule was stable dating to 2018.  A 2.9 x 2 cm right upper lobe nodule was felt to be similar or slightly increased compared to previous imaging 03/01/2023.  Surveillance every 12 months was recommended.  ECHO revealed EF 60-65%; mild LAE noted. She received albuterol nebulization prior to admission.  With aggressive pulmonary toilet intervention O2 was able to be weaned from 2 L of nasal oxygen down to 1 L and subsequently room air at discharge. Labs revealed mild hyponatremia with a nadir value of 134. Albumin 4.0 & total protein 6.4. PT/OT worked with the patient to improve mobilization.  Tylenol  and topical Voltaren gel were employed for the knee pain.  PT/OT recommended SNF placement for rehab.  Past medical and surgical history: Includes essential hypertension; dyslipidemia;  anxiety/depression;GERD; and hypothyroidism. Surgeries and procedures include bilateral mastectomy; cholecystectomy; colonoscopy; EGD; Maloney dilation; savory dilation; and vesicovaginal fistula closure with TAH.  Family history: reviewed, non contributory due to advanced age.  Social history:Nondrinker; former smoker.  She states that she actually quit smoking the day of admission 03/01/2024.  She has most interesting smoking history.  She stated that she smoked from age 43 up to age 42 at a maximum consumption rate of 1 pack/month.  She stated that mainly she would smoke after a meal.  She states that there are periods when she would not smoke such as when on vacation with her family.   Review of systems: She describes the reason for the ER visit is severe pain in the left  thigh area worse with mobilization.  She was only able to mobilize with a motorized scooter and was continue to have pain prompting the visit.  She feels it was a muscle spasm without trauma or definite etiology.  She has a history of remote trauma in her 30s.  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  She denies any significant cardiopulmonary symptoms at this time.  She has occasional constipation.  Constitutional: No fever, significant weight change, fatigue  Eyes: No redness, discharge, pain, vision change ENT/mouth: No nasal congestion, purulent discharge, earache, change in hearing, sore throat  Cardiovascular: No chest pain, palpitations, paroxysmal nocturnal dyspnea, claudication, edema  Respiratory: No cough, sputum production, hemoptysis, DOE, significant snoring, apnea Gastrointestinal: No heartburn, dysphagia, abdominal pain, nausea /vomiting, rectal bleeding, melena, change in bowels Genitourinary: No  dysuria, hematuria, pyuria, incontinence, nocturia Musculoskeletal: No joint stiffness, joint swelling, weakness, pain Dermatologic:  No rash, pruritus, change in appearance of skin Neurologic: No dizziness, headache, syncope, seizures, numbness, tingling Psychiatric: No significant anxiety, depression, insomnia, anorexia Endocrine: No change in hair/skin/nails, excessive thirst, excessive hunger, excessive urination  Hematologic/lymphatic: No significant bruising, lymphadenopathy, abnormal bleeding Allergy/immunology: No itchy/watery eyes, significant sneezing, urticaria, angioedema  Physical exam:  Pertinent or positive findings: She appears her age and adequately nourished.  Eyebrows are decreased laterally.  She is hard of hearing.  The corner of the right mouth droop slightly.  She has malalignment of the mandibular teeth but dental hygiene is excellent.  Breath sounds are decreased.  Abdomen is protuberant.  Pedal pulses are decreased to palpation.  Trace edema is noted at the sock line.  There is posttraumatic and surgical deformity of the left lower extremity below the knee.  There is bulging tissue which she states is the muscle transplant.  She has mild bruising of the dorsum of the hands, left greater than right.  Clubbing of the nailbeds is noted.  General appearance: Adequately nourished; no acute distress, increased work of breathing is present.   Lymphatic: No lymphadenopathy about the head, neck, axilla. Eyes: No conjunctival inflammation or lid edema is present. There is no scleral icterus. Ears:  External ear exam shows no significant lesions or deformities.   Nose:  External nasal examination shows no deformity or inflammation. Nasal mucosa are pink and moist without lesions, exudates Oral exam: Lips and gums are healthy appearing.There is no oropharyngeal erythema or exudate. Neck:  No thyromegaly, masses, tenderness noted.    Heart:  Normal rate and regular rhythm. S1 and S2 normal without gallop, murmur, click, rub.  Lungs: Chest clear to auscultation without wheezes, rhonchi, rales, rubs. Abdomen: Bowel  sounds are normal.  Abdomen is soft and nontender with no organomegaly, hernias, masses. GU: Deferred  Extremities:  No cyanosis, clubbing, edema. Neurologic exam:  Strength equal  in upper & lower extremities. Balance, Rhomberg, finger to nose testing could not be completed due to clinical state Deep tendon reflexes are equal Skin: Warm & dry w/o tenting. No significant lesions or rash.  See clinical summary under each active problem in the Problem List with associated updated therapeutic plan

## 2024-03-11 NOTE — Assessment & Plan Note (Signed)
 Good clinical response to pulmonary toilet regimen with subsequent weaning back to room air.

## 2024-03-11 NOTE — Patient Instructions (Signed)
 See assessment and plan under each diagnosis in the problem list and acutely for this visit

## 2024-03-13 ENCOUNTER — Encounter: Payer: Self-pay | Admitting: Adult Health

## 2024-03-13 ENCOUNTER — Non-Acute Institutional Stay (SKILLED_NURSING_FACILITY): Admitting: Adult Health

## 2024-03-13 ENCOUNTER — Other Ambulatory Visit: Payer: Self-pay | Admitting: Adult Health

## 2024-03-13 DIAGNOSIS — J441 Chronic obstructive pulmonary disease with (acute) exacerbation: Secondary | ICD-10-CM | POA: Diagnosis not present

## 2024-03-13 DIAGNOSIS — I10 Essential (primary) hypertension: Secondary | ICD-10-CM

## 2024-03-13 DIAGNOSIS — M1712 Unilateral primary osteoarthritis, left knee: Secondary | ICD-10-CM

## 2024-03-13 MED ORDER — ALBUTEROL SULFATE HFA 108 (90 BASE) MCG/ACT IN AERS: 2.0000 | INHALATION_SPRAY | Freq: Four times a day (QID) | RESPIRATORY_TRACT | 0 refills | Status: AC | PRN

## 2024-03-13 MED ORDER — AMLODIPINE BESYLATE 5 MG PO TABS: 5.0000 mg | ORAL_TABLET | Freq: Every day | ORAL | 0 refills | Status: AC

## 2024-03-13 MED ORDER — LISINOPRIL 40 MG PO TABS: 40.0000 mg | ORAL_TABLET | Freq: Every day | ORAL | 0 refills | Status: AC

## 2024-03-13 MED ORDER — UMECLIDINIUM-VILANTEROL 62.5-25 MCG/ACT IN AEPB: 1.0000 | INHALATION_SPRAY | Freq: Every day | RESPIRATORY_TRACT | 0 refills | Status: AC

## 2024-03-13 MED ORDER — DICLOFENAC SODIUM 1 % EX GEL: 2.0000 g | Freq: Four times a day (QID) | CUTANEOUS | 0 refills | Status: AC | PRN

## 2024-03-13 MED ORDER — HYDROCHLOROTHIAZIDE 25 MG PO TABS: 25.0000 mg | ORAL_TABLET | Freq: Every day | ORAL | 0 refills | Status: AC

## 2024-03-13 MED ORDER — CARVEDILOL 3.125 MG PO TABS: 3.1250 mg | ORAL_TABLET | Freq: Two times a day (BID) | ORAL | 0 refills | Status: AC

## 2024-03-13 MED ORDER — OMEPRAZOLE 40 MG PO CPDR: 40.0000 mg | DELAYED_RELEASE_CAPSULE | Freq: Every day | ORAL | 0 refills | Status: AC

## 2024-03-13 MED ORDER — LEVOTHYROXINE SODIUM 137 MCG PO TABS: 137.0000 ug | ORAL_TABLET | Freq: Every day | ORAL | 0 refills | Status: AC

## 2024-03-13 MED ORDER — CLONAZEPAM 0.5 MG PO TABS: 0.2500 mg | ORAL_TABLET | ORAL | 0 refills | Status: AC

## 2024-03-13 MED ORDER — ATORVASTATIN CALCIUM 40 MG PO TABS: 40.0000 mg | ORAL_TABLET | Freq: Every day | ORAL | 0 refills | Status: AC

## 2024-03-13 NOTE — Assessment & Plan Note (Signed)
 [facility] to be scheduled by PCP.

## 2024-03-13 NOTE — Progress Notes (Signed)
 Location:  [facility] Room Number: 131 Place of Service:  SNF (31)   CODE STATUS: full code   Allergies  Allergen Reactions   Sulfa Antibiotics Anaphylaxis    Chief Complaint  Patient presents with   Discharge Note    HPI:  She is being discharged to home with home health for pt/ot. She will not need any dme; will need to have her prescriptions written and will need to follow up with her medical provider. She had been hospitalized with severe left thigh pain. She was unable to stand on her leg.  Upon discharging from the ED she was found to have need for 02. She was desatting in the mid 80's while ambulating. Her workup was negative for dvt and pe. Her cardiac workup was unremarkable for heart failure exacerbation. She was gradually weaned from 2 liters to room air. She did work with pt/ot throughout the hospitalization. Her pain was managed with tylenol  and voltaren  gel as needed. She was admitted to this facility for short term rehab: ambulate 150 feet with rolling walker with contact guard assist; upper and lower body supervision; brp: mod I. She has been seen by neurosurgeon; for T12 fracture will be getting an MRI; and will be getting cement for her fracture.   Past Medical History:  Diagnosis Date   Abnormal heart rhythms    Allergic rhinitis    Anxiety    Arthritis    Bronchitis    Chest wall pain    ANTERIOR   Depression    Dysuria    Eustachian tube dysfunction    Gallstones    Gallstones    GERD (gastroesophageal reflux disease)    Hyperlipidemia    Hypertension    Hypothyroidism    Hypoxemia    Incontinence    Liver function study, abnormal    Malaise and fatigue    Microscopic hematuria    Osteopenia    Personal history of colonic polyps    Primary breast malignancy (HCC)    Snoring    Tobacco abuse    Ulcer     Past Surgical History:  Procedure Laterality Date   BILATERAL MASTECTOMY     BILIARY DILATION  06/26/2023    Procedure: DILATION, STRICTURE, BILE DUCT;  Surgeon: Charlanne Groom, MD;  Location: [facility] ENDOSCOPY;  Service: Gastroenterology;;   CHOLECYSTECTOMY N/A 06/25/2023   Procedure: LAPAROSCOPIC CHOLECYSTECTOMY;  Surgeon: Ebbie Cough, MD;  Location: [facility] OR;  Service: General;  Laterality: N/A;   COLONOSCOPY  07/2005   MFM:[id number] internal hemorrhoids otherwise normal   COLONOSCOPY  05/25/2010   MFM:[id number] hemorrhoids, otherwise normal rectum and colon.(POOR PREP). Due for surveillance 2017.    COLONOSCOPY  04/22/2002   RMR: Internal hemorrhoids, otherwise normal rectum  Polyps in the left colon resected with snare   COLONOSCOPY N/A 11/30/2017   Procedure: COLONOSCOPY;  Surgeon: Shaaron Lamar HERO, MD;  [facility] SU[facility];  Service: Endoscopy;  Laterality: N/A;  12:00pm   ERCP N/A 06/26/2023   Procedure: ENDOSCOPIC RETROGRADE CHOLANGIOPANCREATOGRAPHY (ERCP);  Surgeon: Charlanne Groom, MD;  Location: [facility] ENDOSCOPY;  Service: Gastroenterology;  Laterality: N/A;   ESOPHAGOGASTRODUODENOSCOPY  05/25/2010   RMR:EGD distal esophageal erosions consistent with mild erosive reflux esophagitis, otherwise unremarkable esophagus status post passage of a 56-French Maloney dilator/Small hiatal hernia/Gastric ulcer with negative H.pylori. Overdue for surveillance.    ESOPHAGOGASTRODUODENOSCOPY N/A 03/10/2013   Dr. Shaaron: Schatzki's ring s/p dilation, erosive reflux esophagitis, hiatal hernia, gastric erosions, negative h.pylori   ESOPHAGOGASTRODUODENOSCOPY N/A  11/30/2017   Procedure: ESOPHAGOGASTRODUODENOSCOPY (EGD);  Surgeon: Shaaron Lamar HERO, MD;  Location: [facility];  Service: Endoscopy;  Laterality: N/A;   LEFT LEG SURGERY     MULTIPLE DUE TO FX   LEFT RTC TEAR     MALONEY DILATION N/A 03/10/2013   Procedure: AGAPITO DILATION;  Surgeon: Lamar HERO Shaaron, MD;  Location: [facility];  Service: Endoscopy;  Laterality: N/A;   MALONEY DILATION N/A 11/30/2017   Procedure: AGAPITO DILATION;  Surgeon: Shaaron Lamar HERO, MD;  [facility]: [facility];  Service: Endoscopy;  Laterality: N/A;   SAVORY DILATION N/A 03/10/2013   Procedure: SAVORY DILATION;  Surgeon: Lamar HERO Shaaron, MD;  Location: [facility];  Service: Endoscopy;  Laterality: N/A;   STONE EXTRACTION WITH BASKET  06/26/2023   Procedure: ERCP, WITH LITHROTRIPSY OR REMOVAL OF COMMON BILE DUCT CALCULUS USING BALLOON;  Surgeon: Charlanne Groom, MD;  Location: [facility] ENDOSCOPY;  Service: Gastroenterology;;   TONSILECTOMY, ADENOIDECTOMY, BILATERAL MYRINGOTOMY AND TUBES     TONSILLECTOMY     VESICOVAGINAL FISTULA CLOSURE W/ TAH     excessive bleeding-benign    Social History   Socioeconomic History   Marital status: Single    Spouse name: Not on file   Number of children: Not on file   Years of education: Not on file   Highest education level: Not on file  Occupational History   Occupation: retired  Tobacco Use   Smoking status: Some Days    Current packs/day: 0.25    Average packs/day: 0.3 packs/day for 25.0 years (6.3 ttl pk-yrs)    Types: Cigarettes   Smokeless tobacco: Never   Tobacco comments:    Smokes a pack of cigarettes about every 2-3 days  Vaping Use   Vaping status: Never Used  Substance and Sexual Activity   Alcohol use: No   Drug use: No   Sexual activity: Not Currently  Other Topics Concern   Not on file  Social History Narrative   Not on file   Social Drivers of Health   Financial Resource Strain: Not on file  Food Insecurity: Unknown (03/02/2024)   Hunger Vital Sign    Worried About Running Out of Food in the Last Year: Never true    Ran Out of Food in the Last Year: Not on file  Transportation Needs: Unknown (03/02/2024)   PRAPARE - Administrator, Civil Service (Medical): No    Lack of Transportation (Non-Medical): Not on file  Physical Activity: Not on file  Stress: Not on file  Social Connections: Unknown (03/02/2024)   Social Connection and Isolation Panel    Frequency of Communication with  Friends and Family: Three times a week    Frequency of Social Gatherings with Friends and Family: Three times a week    Attends Religious Services: More than 4 times per year    Active Member of Clubs or Organizations: Yes    Attends Banker Meetings: More than 4 times per year    Marital Status: Patient declined  Intimate Partner Violence: Unknown (03/02/2024)   Humiliation, Afraid, Rape, and Kick questionnaire    Fear of Current or Ex-Partner: No    Emotionally Abused: Not on file    Physically Abused: Not on file    Sexually Abused: Not on file   Family History  Problem Relation Age of Onset   Cancer Mother    Heart disease Father    Heart disease Brother    Cancer  Sister    Aneurysm Sister    Colon cancer Neg Hx       VITAL SIGNS BP (!) 141/72   Pulse 74   Temp (!) 97.4 F (36.3 C)   Resp 20   Ht 5' 6 (1.676 m)   Wt 172 lb 8 oz (78.2 kg)   SpO2 97%   BMI 27.84 kg/m   Outpatient Encounter Medications as of 03/13/2024  Medication Sig   acetaminophen  (TYLENOL ) 500 MG tablet Take 1,000 mg by mouth daily as needed (for pain).   albuterol (VENTOLIN HFA) 108 (90 Base) MCG/ACT inhaler Inhale 2 puffs into the lungs every 6 (six) hours as needed for wheezing or shortness of breath.   amLODipine (NORVASC) 5 MG tablet Take 5 mg by mouth daily.   aspirin EC 81 MG tablet Take 81 mg by mouth daily.   atorvastatin (LIPITOR) 40 MG tablet Take 40 mg by mouth daily.   calcium carbonate (TUMS - DOSED IN MG ELEMENTAL CALCIUM) 500 MG chewable tablet Chew 2 tablets by mouth daily.   carvedilol (COREG) 3.125 MG tablet Take 3.125 mg by mouth in the morning and at bedtime.   clonazePAM  (KLONOPIN ) 0.5 MG tablet Take 0.5 tablets (0.25 mg total) by mouth See admin instructions. Take 0.25 mg by mouth at bedtime and an additional 0.25 mg once a day as needed for anxiety   diclofenac Sodium (VOLTAREN) 1 % GEL Apply 2 g topically 4 (four) times daily as needed (pain).    hydrochlorothiazide (HYDRODIURIL) 25 MG tablet Take 25 mg by mouth daily.   levothyroxine  (SYNTHROID , LEVOTHROID) 137 MCG tablet Take 137 mcg by mouth daily before breakfast.    lisinopril (ZESTRIL) 40 MG tablet Take 40 mg by mouth daily.   loratadine  (CLARITIN ) 10 MG tablet Take 10 mg by mouth at bedtime.   Multiple Vitamins-Minerals (PRESERVISION AREDS 2) CAPS Take 1 capsule by mouth in the morning and at bedtime.   omeprazole (PRILOSEC) 40 MG capsule Take 40 mg by mouth daily.   umeclidinium-vilanterol (ANORO ELLIPTA) 62.5-25 MCG/ACT AEPB Inhale 1 puff into the lungs daily.   No facility-administered encounter medications on file as of 03/13/2024.     SIGNIFICANT DIAGNOSTIC EXAMS  LABS  03-01-24: wbc 10.7; hgb 12.7; hct 38.3; mcv 91.8 plt 227; glucose 102; bun 12; creat 0.49; k+ 3.2; na++ 137; ca 9.0 gfr >60 d-dimer: 6.25 03-02-24: BNP 201.9; tsh 4.439; hgb A1c 5.4 03-04-24; glucose 97; bun 11; creat 0.56; k+ 4.0; na++ 134; ca 9.0 gfr >60   Review of Systems  Constitutional:  Negative for malaise/fatigue.  Respiratory:  Negative for cough and shortness of breath.   Cardiovascular:  Negative for chest pain, palpitations and leg swelling.  Gastrointestinal:  Negative for abdominal pain, constipation and heartburn.  Musculoskeletal:  Negative for back pain, joint pain and myalgias.  Skin: Negative.   Neurological:  Negative for dizziness.  Psychiatric/Behavioral:  The patient is not nervous/anxious.     Physical Exam Constitutional:      General: She is not in acute distress.    Appearance: She is well-developed. She is not diaphoretic.  Neck:     Thyroid : No thyromegaly.  Cardiovascular:     Rate and Rhythm: Normal rate and regular rhythm.     Heart sounds: Normal heart sounds.  Pulmonary:     Effort: Pulmonary effort is normal. No respiratory distress.     Breath sounds: Normal breath sounds.  Abdominal:     General: Bowel sounds are normal. There  is no distension.      Palpations: Abdomen is soft.     Tenderness: There is no abdominal tenderness.  Musculoskeletal:        General: Normal range of motion.     Cervical back: Neck supple.     Right lower leg: No edema.     Left lower leg: No edema.  Lymphadenopathy:     Cervical: No cervical adenopathy.  Skin:    General: Skin is warm and dry.  Neurological:     Mental Status: She is alert and oriented to person, place, and time.  Psychiatric:        Mood and Affect: Mood normal.       ASSESSMENT/ PLAN:   Patient is being discharged with the following home health services:  pt/ot to evaluate and treat as indicated for gait balance strength adl training.   Patient is being discharged with the following durable medical equipment:  none needed   Patient has been advised to f/u with their PCP in 1-2 weeks to for a transitions of care visit.  Social services at their facility was responsible for arranging this appointment.  Pt was provided with adequate prescriptions of noncontrolled medications to reach the scheduled appointment .  For controlled substances, a limited supply was provided as appropriate for the individual patient.  If the pt normally receives these medications from a pain clinic or has a contract with another physician, these medications should be received from that clinic or physician only).    A 30 day supply of her prescription medications have been sent to [facility] pharmacy.   Time spent with patient: 40 minutes: medications; home health; dme.    Barnie Seip NP [facility]  call [phone]

## 2024-03-13 NOTE — Assessment & Plan Note (Addendum)
 She describes persistent L thigh pain in context of prior traumatic injury. Outpatient follow-up with Tawni Qua PA, [facility].

## 2024-03-14 ENCOUNTER — Encounter: Payer: Self-pay | Admitting: Internal Medicine

## 2024-03-14 DIAGNOSIS — M4850XA Collapsed vertebra, not elsewhere classified, site unspecified, initial encounter for fracture: Secondary | ICD-10-CM | POA: Insufficient documentation

## 2024-03-16 DIAGNOSIS — M1712 Unilateral primary osteoarthritis, left knee: Secondary | ICD-10-CM | POA: Diagnosis not present

## 2024-03-16 DIAGNOSIS — M6281 Muscle weakness (generalized): Secondary | ICD-10-CM | POA: Diagnosis not present

## 2024-03-19 DIAGNOSIS — M545 Low back pain, unspecified: Secondary | ICD-10-CM | POA: Diagnosis not present

## 2024-03-20 ENCOUNTER — Emergency Department (HOSPITAL_COMMUNITY)
Admission: EM | Admit: 2024-03-20 | Discharge: 2024-03-20 | Disposition: A | Discharge status: Home / Self Care | Attending: Emergency Medicine | Admitting: Emergency Medicine

## 2024-03-20 DIAGNOSIS — N3001 Acute cystitis with hematuria: Secondary | ICD-10-CM

## 2024-03-20 DIAGNOSIS — Z7982 Long term (current) use of aspirin: Secondary | ICD-10-CM | POA: Insufficient documentation

## 2024-03-20 DIAGNOSIS — D72829 Elevated white blood cell count, unspecified: Secondary | ICD-10-CM | POA: Insufficient documentation

## 2024-03-20 DIAGNOSIS — N390 Urinary tract infection, site not specified: Secondary | ICD-10-CM | POA: Insufficient documentation

## 2024-03-20 DIAGNOSIS — E871 Hypo-osmolality and hyponatremia: Secondary | ICD-10-CM | POA: Insufficient documentation

## 2024-03-20 DIAGNOSIS — R350 Frequency of micturition: Secondary | ICD-10-CM | POA: Diagnosis not present

## 2024-03-20 LAB — CBC WITH DIFFERENTIAL/PLATELET
Abs Immature Granulocytes: 0.04 K/uL (ref 0.00–0.07)
Basophils Absolute: 0.1 K/uL (ref 0.0–0.1)
Basophils Relative: 1 %
Eosinophils Absolute: 0.3 K/uL (ref 0.0–0.5)
Eosinophils Relative: 3 %
HCT: 34.9 % — ABNORMAL LOW (ref 36.0–46.0)
Hemoglobin: 11.8 g/dL — ABNORMAL LOW (ref 12.0–15.0)
Immature Granulocytes: 0 %
Lymphocytes Relative: 22 %
Lymphs Abs: 2.4 K/uL (ref 0.7–4.0)
MCH: 30.3 pg (ref 26.0–34.0)
MCHC: 33.8 g/dL (ref 30.0–36.0)
MCV: 89.7 fL (ref 80.0–100.0)
Monocytes Absolute: 1.3 K/uL — ABNORMAL HIGH (ref 0.1–1.0)
Monocytes Relative: 12 %
Neutro Abs: 7 K/uL (ref 1.7–7.7)
Neutrophils Relative %: 62 %
Platelets: 412 K/uL — ABNORMAL HIGH (ref 150–400)
RBC: 3.89 MIL/uL (ref 3.87–5.11)
RDW: 13.7 % (ref 11.5–15.5)
WBC: 11.2 K/uL — ABNORMAL HIGH (ref 4.0–10.5)
nRBC: 0 % (ref 0.0–0.2)

## 2024-03-20 LAB — URINALYSIS, ROUTINE W REFLEX MICROSCOPIC
Bilirubin Urine: NEGATIVE
Glucose, UA: NEGATIVE mg/dL
Ketones, ur: NEGATIVE mg/dL
Nitrite: NEGATIVE
Protein, ur: 100 mg/dL — AB
RBC / HPF: >50 RBC/hpf (ref 0–5)
Specific Gravity, Urine: 1.017 (ref 1.005–1.030)
WBC, UA: >50 WBC/hpf (ref 0–5)
pH: 5 (ref 5.0–8.0)

## 2024-03-20 LAB — BASIC METABOLIC PANEL WITH GFR
Anion gap: 12 (ref 5–15)
BUN: 42 mg/dL — ABNORMAL HIGH (ref 8–23)
CO2: 27 mmol/L (ref 22–32)
Calcium: 9.5 mg/dL (ref 8.9–10.3)
Chloride: 92 mmol/L — ABNORMAL LOW (ref 98–111)
Creatinine, Ser: 0.97 mg/dL (ref 0.44–1.00)
GFR, Estimated: 59 mL/min — ABNORMAL LOW (ref >60)
Glucose, Bld: 105 mg/dL — ABNORMAL HIGH (ref 70–99)
Potassium: 4.3 mmol/L (ref 3.5–5.1)
Sodium: 130 mmol/L — ABNORMAL LOW (ref 135–145)

## 2024-03-20 MED ORDER — CEPHALEXIN 500 MG PO CAPS: 500.0000 mg | ORAL_CAPSULE | Freq: Four times a day (QID) | ORAL | 0 refills | Status: AC

## 2024-03-20 MED ORDER — SODIUM CHLORIDE 0.9 % IV SOLN
1.0000 g | Freq: Once | INTRAVENOUS | Status: AC
  Administered 2024-03-20: 1 g via INTRAVENOUS
  Filled 2024-03-20: qty 10

## 2024-03-20 NOTE — ED Notes (Signed)
 Pt/family received d/c paperwork at this time. After going over the paperwork any questions, comments, or concerns were answered to the best of this nurse's knowledge. The pt/family verbally acknowledged the teachings/instructions.

## 2024-03-20 NOTE — ED Provider Notes (Signed)
 [place] EMERGENCY DEPARTMENT AT [facility] Provider Note   CSN: [id number] Arrival date & time: 03/20/24  1635     Patient presents with: Urinary Tract Infection   [name] is a 83 y.o. female.    Urinary Tract Infection Associated symptoms: no fever, no flank pain, no nausea and no vomiting         [name] is a 83 y.o. female who comes from home accompanied by family members requesting evaluation for possible UTI.  She endorses burning sensation with urination that began yesterday.  Symptoms have continued today.  She describes increased urinary frequency with voiding small amounts.  She is also complaining of some pressure fullness sensation of her middle lower abdomen.  No fever chills flank pain nausea or vomiting.    Prior to Admission medications   Medication Sig Start Date End Date Taking? Authorizing Provider  acetaminophen  (TYLENOL ) 500 MG tablet Take 1,000 mg by mouth daily as needed (for pain).    [provider]  albuterol  (VENTOLIN  HFA) 108 (90 Base) MCG/ACT inhaler Inhale 2 puffs into the lungs every 6 (six) hours as needed for wheezing or shortness of breath. 03/13/24   Landy Barnie RAMAN, NP  amLODipine  (NORVASC ) 5 MG tablet Take 1 tablet (5 mg total) by mouth daily. 03/13/24   Landy Barnie RAMAN, NP  aspirin  EC 81 MG tablet Take 81 mg by mouth daily.    [provider]  atorvastatin  (LIPITOR) 40 MG tablet Take 1 tablet (40 mg total) by mouth daily. 03/13/24   Landy Barnie RAMAN, NP  calcium  carbonate (TUMS - DOSED IN MG ELEMENTAL CALCIUM ) 500 MG chewable tablet Chew 2 tablets by mouth daily.    [provider]  carvedilol  (COREG ) 3.125 MG tablet Take 1 tablet (3.125 mg total) by mouth in the morning and at bedtime. 03/13/24   Landy Barnie RAMAN, NP  clonazePAM  (KLONOPIN ) 0.5 MG tablet Take 0.5 tablets (0.25 mg total) by mouth See admin instructions. Take 0.25 mg by mouth at bedtime and an additional 0.25 mg once a day  as needed for anxiety 03/13/24   Landy Barnie RAMAN, NP  diclofenac  Sodium (VOLTAREN ) 1 % GEL Apply 2 g topically 4 (four) times daily as needed (pain). 03/13/24   Landy Barnie RAMAN, NP  hydrochlorothiazide  (HYDRODIURIL ) 25 MG tablet Take 1 tablet (25 mg total) by mouth daily. 03/13/24   Landy Barnie RAMAN, NP  levothyroxine  (SYNTHROID ) 137 MCG tablet Take 1 tablet (137 mcg total) by mouth daily before breakfast. 03/13/24   Landy Barnie RAMAN, NP  lisinopril  (ZESTRIL ) 40 MG tablet Take 1 tablet (40 mg total) by mouth daily. 03/13/24   Landy Barnie RAMAN, NP  loratadine  (CLARITIN ) 10 MG tablet Take 10 mg by mouth at bedtime.    [provider]  Multiple Vitamins-Minerals (PRESERVISION AREDS 2) CAPS Take 1 capsule by mouth in the morning and at bedtime.    [provider]  omeprazole  (PRILOSEC) 40 MG capsule Take 1 capsule (40 mg total) by mouth daily. 03/13/24   Landy Barnie RAMAN, NP  umeclidinium-vilanterol (ANORO ELLIPTA ) 62.5-25 MCG/ACT AEPB Inhale 1 puff into the lungs daily. 03/13/24   Landy Barnie RAMAN, NP    Allergies: Sulfa antibiotics    Review of Systems  Constitutional:  Negative for appetite change, chills and fever.  Respiratory:  Negative for shortness of breath.   Cardiovascular:  Negative for chest pain.  Gastrointestinal:  Negative for nausea and vomiting.  Genitourinary:  Positive for  dysuria and frequency. Negative for difficulty urinating, flank pain, hematuria, pelvic pain and vaginal pain.  Musculoskeletal:  Negative for back pain.  Neurological:  Negative for dizziness and weakness.    Updated Vital Signs Temp 97.7 F (36.5 C) (Oral)   Ht 5' 6 (1.676 m)   Wt 78.2 kg   BMI 27.84 kg/m   Physical Exam Vitals and nursing note reviewed.  Constitutional:      Appearance: Normal appearance.  HENT:     Mouth/Throat:     Mouth: Mucous membranes are moist.  Cardiovascular:     Rate and Rhythm: Normal rate and regular rhythm.     Pulses: Normal pulses.   Pulmonary:     Effort: Pulmonary effort is normal.  Chest:     Chest wall: No tenderness.  Abdominal:     General: There is no distension.     Palpations: Abdomen is soft.     Tenderness: There is no abdominal tenderness. There is no right CVA tenderness or left CVA tenderness.  Musculoskeletal:        General: Normal range of motion.  Skin:    General: Skin is warm.  Neurological:     General: No focal deficit present.     Mental Status: She is alert.     Sensory: No sensory deficit.     Motor: No weakness.     (all labs ordered are listed, but only abnormal results are displayed) Labs Reviewed  URINALYSIS, ROUTINE W REFLEX MICROSCOPIC - Abnormal; Notable for the following components:      Result Value   Color, Urine AMBER (*)    APPearance CLOUDY (*)    Hgb urine dipstick SMALL (*)    Protein, ur 100 (*)    Leukocytes,Ua MODERATE (*)    Bacteria, UA FEW (*)    All other components within normal limits  URINE CULTURE  CBC WITH DIFFERENTIAL/PLATELET  BASIC METABOLIC PANEL WITH GFR    EKG: None  Radiology: No results found.   Procedures   Medications Ordered in the ED - No data to display                                  Medical Decision Making   Patient here from home with family for evaluation of dysuria.  Symptoms began mildly yesterday continued today.  She describes having a burning sensation with voiding and feeling that she is voiding small amounts and more frequently.  Describes a feeling some pressure/fullness sensation of her suprapubic area.  No flank pain, fever, chills, nausea or vomiting.  Differential would include but not limited to cystitis, pyelonephritis, acute abdominal process also considered but felt less likely.  Amount and/or Complexity of Data Reviewed Labs: ordered.    Details: Urinalysis shows cloudy urine with moderate leukocytes greater than 50 white cells few bacteria.  Urine culture pending.  No significant  leukocytosis Discussion of management or test interpretation with external provider(s):   PVR 9 mL.  Urine culture pending  Patient well-appearing nontoxic on exam.  Vital signs reassuring she does have a mild hyponatremia but she is tolerating oral fluids.  She was given IV ceftriaxone  here.  She is felt to be appropriate for discharge home, prescription for cephalexin .  She has PCP appointment scheduled for Wednesday.  Is agreeable to have her sodium rechecked at her PCP's visit.  Return precautions were also given  Final diagnoses:  Acute cystitis with hematuria    ED Discharge Orders     None          Herlinda Milling, DEVONNA 03/20/24 1936    Cleotilde Rogue, MD 03/21/24 [phone]

## 2024-03-20 NOTE — Discharge Instructions (Signed)
 Your workup this evening shows a urinary tract infection.  You have been given antibiotics through an IV this evening.  A prescription has been written for antibiotics to take by mouth for the next several days.  Please drink plenty of water .  Also, your sodium level was slightly low today.  Please drink water  and/or Gatorade for the next few days.  Keep your upcoming appointment with your primary care provider for recheck and to also have your sodium level rechecked as well.  Return to the emergency department if you develop any new or worsening symptoms

## 2024-03-20 NOTE — ED Triage Notes (Addendum)
 Pt comes in for a burning sensation with urinating. A&Ox4.   Family is concerned b/c the pt's sister died from a septic UTI.

## 2024-03-23 LAB — URINE CULTURE: Culture: >=100000 — AB

## 2024-03-24 ENCOUNTER — Telehealth (HOSPITAL_BASED_OUTPATIENT_CLINIC_OR_DEPARTMENT_OTHER): Payer: Self-pay | Admitting: *Deleted

## 2024-03-24 DIAGNOSIS — R300 Dysuria: Secondary | ICD-10-CM | POA: Diagnosis not present

## 2024-03-24 DIAGNOSIS — I10 Essential (primary) hypertension: Secondary | ICD-10-CM | POA: Diagnosis not present

## 2024-03-24 NOTE — Telephone Encounter (Signed)
 Post ED Visit - Positive Culture Follow-up  Culture report reviewed by antimicrobial stewardship pharmacist: [facility] [x]  [place], [place].D. []  Venetia Gully, Pharm.D., BCPS AQ-ID []  Garrel Crews, Pharm.D., BCPS []  Almarie Lunger, [address].D., BCPS []  [employer], [address].D., BCPS, AAHIVP []  Rosaline Bihari, Pharm.D., BCPS, AAHIVP []  Vernell Meier, PharmD, BCPS []  Latanya Hint, PharmD, BCPS []  Donald Medley, PharmD, BCPS []  Rocky Bold, PharmD []  Dorothyann Alert, PharmD, BCPS []  Morene Babe, PharmD  [facility] []  Rosaline Edison, PharmD []  Romona Bliss, PharmD []  Dolphus Roller, PharmD []  Veva Seip, Rph []  Vernell Daunt) Leonce, PharmD []  Eva Allis, PharmD []  Rosaline Millet, PharmD []  Iantha Batch, PharmD []  Arvin Gauss, PharmD []  Wanda Hasting, PharmD []  Ronal Rav, PharmD []  Rocky Slade, PharmD []  Bard Jeans, PharmD   Positive urine culture Treated with cephalexin , organism sensitive to the same and no further patient follow-up is required at this time.  Lorita Barnie Pereyra 03/24/2024, 3:14 PM

## 2024-03-31 DIAGNOSIS — H353221 Exudative age-related macular degeneration, left eye, with active choroidal neovascularization: Secondary | ICD-10-CM | POA: Diagnosis not present

## 2024-03-31 DIAGNOSIS — H43813 Vitreous degeneration, bilateral: Secondary | ICD-10-CM | POA: Diagnosis not present

## 2024-03-31 DIAGNOSIS — H353113 Nonexudative age-related macular degeneration, right eye, advanced atrophic without subfoveal involvement: Secondary | ICD-10-CM | POA: Diagnosis not present

## 2024-04-03 DIAGNOSIS — I10 Essential (primary) hypertension: Secondary | ICD-10-CM | POA: Diagnosis not present

## 2024-04-03 DIAGNOSIS — H5330 Unspecified disorder of binocular vision: Secondary | ICD-10-CM | POA: Diagnosis not present

## 2024-04-03 DIAGNOSIS — R300 Dysuria: Secondary | ICD-10-CM | POA: Diagnosis not present

## 2024-04-03 DIAGNOSIS — J3489 Other specified disorders of nose and nasal sinuses: Secondary | ICD-10-CM | POA: Diagnosis not present

## 2024-04-03 DIAGNOSIS — H3530 Unspecified macular degeneration: Secondary | ICD-10-CM | POA: Diagnosis not present

## 2024-04-03 DIAGNOSIS — G8929 Other chronic pain: Secondary | ICD-10-CM | POA: Diagnosis not present

## 2024-04-03 DIAGNOSIS — R062 Wheezing: Secondary | ICD-10-CM | POA: Diagnosis not present

## 2024-04-03 DIAGNOSIS — M5459 Other low back pain: Secondary | ICD-10-CM | POA: Diagnosis not present

## 2024-04-08 DIAGNOSIS — M4726 Other spondylosis with radiculopathy, lumbar region: Secondary | ICD-10-CM | POA: Diagnosis not present

## 2024-04-15 ENCOUNTER — Other Ambulatory Visit: Payer: Self-pay | Admitting: Adult Health

## 2024-04-21 ENCOUNTER — Encounter: Payer: Self-pay | Admitting: *Deleted

## 2024-04-23 ENCOUNTER — Ambulatory Visit

## 2024-04-24 ENCOUNTER — Telehealth: Payer: Self-pay

## 2024-04-24 NOTE — Telephone Encounter (Signed)
 Auth Submission: APPROVED Site of care: Site of care: [facility] Payer: [employer] medicare Medication & CPT/J Code(s) submitted: Prolia  (Denosumab ) [id number] Diagnosis Code:  Route of submission (phone, fax, portal): portal Phone # Fax # Auth type: Buy/Bill PB Units/visits requested: 60mg  q24months Reference number: [id number] Approval from: 04/22/24 to 10/10/24

## 2024-04-28 ENCOUNTER — Encounter: Attending: Internal Medicine | Admitting: Emergency Medicine

## 2024-04-28 VITALS — BP 156/61 | HR 78 | Temp 97.6°F | Resp 18

## 2024-04-28 DIAGNOSIS — M810 Age-related osteoporosis without current pathological fracture: Secondary | ICD-10-CM

## 2024-04-28 MED ORDER — DENOSUMAB 60 MG/ML ~~LOC~~ SOSY
60.0000 mg | PREFILLED_SYRINGE | Freq: Once | SUBCUTANEOUS | Status: AC
  Administered 2024-04-28: 60 mg via SUBCUTANEOUS

## 2024-04-28 NOTE — Progress Notes (Signed)
 Diagnosis: Osteoporosis  Provider:  Shona Salvo MD  Procedure: Injection  Prolia  (Denosumab ), Dose: 60 mg, Site: subcutaneous, Number of injections: 1  Injection Site(s): Left arm  Post Care: Patient declined observation  Discharge: Condition: Good, Destination: Home . AVS Declined  Performed by:  Delon ONEIDA Officer, RN

## 2024-04-30 ENCOUNTER — Other Ambulatory Visit: Payer: Self-pay | Admitting: Adult Health

## 2024-11-03 ENCOUNTER — Ambulatory Visit

## 2025-03-02 ENCOUNTER — Inpatient Hospital Stay

## 2025-03-04 ENCOUNTER — Ambulatory Visit (HOSPITAL_COMMUNITY)

## 2025-03-06 ENCOUNTER — Inpatient Hospital Stay

## 2025-03-13 ENCOUNTER — Inpatient Hospital Stay: Admitting: Oncology
